# Patient Record
Sex: Male | Born: 1975 | Race: Black or African American | Hispanic: No | Marital: Married | Smoking: Current every day smoker
Health system: Southern US, Community
[De-identification: ages and names within clinical notes are randomized; demographics above are authoritative.]

## PROBLEM LIST (undated history)

## (undated) DIAGNOSIS — N529 Male erectile dysfunction, unspecified: Secondary | ICD-10-CM

## (undated) DIAGNOSIS — E119 Type 2 diabetes mellitus without complications: Secondary | ICD-10-CM

## (undated) DIAGNOSIS — E559 Vitamin D deficiency, unspecified: Secondary | ICD-10-CM

## (undated) DIAGNOSIS — E785 Hyperlipidemia, unspecified: Secondary | ICD-10-CM

## (undated) DIAGNOSIS — S060X9A Concussion with loss of consciousness of unspecified duration, initial encounter: Secondary | ICD-10-CM

## (undated) DIAGNOSIS — E291 Testicular hypofunction: Secondary | ICD-10-CM

## (undated) DIAGNOSIS — S060XAA Concussion with loss of consciousness status unknown, initial encounter: Secondary | ICD-10-CM

## (undated) DIAGNOSIS — K219 Gastro-esophageal reflux disease without esophagitis: Secondary | ICD-10-CM

## (undated) DIAGNOSIS — E11319 Type 2 diabetes mellitus with unspecified diabetic retinopathy without macular edema: Secondary | ICD-10-CM

## (undated) DIAGNOSIS — K0889 Other specified disorders of teeth and supporting structures: Secondary | ICD-10-CM

## (undated) DIAGNOSIS — I10 Essential (primary) hypertension: Secondary | ICD-10-CM

## (undated) HISTORY — DX: Vitamin D deficiency, unspecified: E55.9

## (undated) HISTORY — DX: Male erectile dysfunction, unspecified: N52.9

## (undated) HISTORY — DX: Hyperlipidemia, unspecified: E78.5

## (undated) HISTORY — DX: Type 2 diabetes mellitus with unspecified diabetic retinopathy without macular edema: E11.319

## (undated) HISTORY — DX: Type 2 diabetes mellitus without complications: E11.9

## (undated) HISTORY — DX: Essential (primary) hypertension: I10

## (undated) HISTORY — DX: Testicular hypofunction: E29.1

## (undated) HISTORY — DX: Concussion with loss of consciousness of unspecified duration, initial encounter: S06.0X9A

## (undated) HISTORY — DX: Concussion with loss of consciousness status unknown, initial encounter: S06.0XAA

---

## 1999-11-15 HISTORY — PX: VASECTOMY: SHX75

## 2005-07-13 ENCOUNTER — Emergency Department: Payer: Self-pay | Admitting: Emergency Medicine

## 2006-06-26 ENCOUNTER — Emergency Department: Payer: Self-pay | Admitting: Emergency Medicine

## 2006-11-14 HISTORY — PX: HERNIA REPAIR: SHX51

## 2006-11-15 ENCOUNTER — Emergency Department: Payer: Self-pay | Admitting: Emergency Medicine

## 2007-01-22 ENCOUNTER — Emergency Department: Payer: Self-pay | Admitting: Emergency Medicine

## 2007-09-03 ENCOUNTER — Emergency Department: Payer: Self-pay | Admitting: Emergency Medicine

## 2007-09-20 ENCOUNTER — Ambulatory Visit: Payer: Self-pay | Admitting: General Surgery

## 2007-09-25 ENCOUNTER — Ambulatory Visit: Payer: Self-pay | Admitting: General Surgery

## 2008-04-01 ENCOUNTER — Ambulatory Visit: Payer: Self-pay | Admitting: Gastroenterology

## 2010-06-05 ENCOUNTER — Emergency Department: Payer: Self-pay | Admitting: Emergency Medicine

## 2011-01-16 ENCOUNTER — Ambulatory Visit: Payer: Self-pay | Admitting: Internal Medicine

## 2013-11-21 LAB — BASIC METABOLIC PANEL
BUN: 12 mg/dL (ref 4–21)
CREATININE: 0.9 mg/dL (ref 0.6–1.3)
GLUCOSE: 286 mg/dL
POTASSIUM: 5 mmol/L (ref 3.4–5.3)
Sodium: 135 mmol/L — AB (ref 137–147)

## 2013-11-21 LAB — HEPATIC FUNCTION PANEL
ALK PHOS: 107 U/L (ref 25–125)
ALT: 17 U/L (ref 10–40)
AST: 12 U/L — AB (ref 14–40)
Bilirubin, Total: 0.4 mg/dL

## 2013-11-21 LAB — LIPID PANEL
Cholesterol: 171 mg/dL (ref 0–200)
HDL: 26 mg/dL — AB (ref 35–70)
LDL CALC: 90 mg/dL
Triglycerides: 275 mg/dL — AB (ref 40–160)

## 2014-02-24 LAB — HEMOGLOBIN A1C: Hgb A1c MFr Bld: 12.8 % — AB (ref 4.0–6.0)

## 2014-04-14 ENCOUNTER — Ambulatory Visit: Payer: Self-pay | Admitting: Physician Assistant

## 2014-04-18 ENCOUNTER — Ambulatory Visit: Payer: Self-pay | Admitting: Physician Assistant

## 2014-12-10 ENCOUNTER — Ambulatory Visit: Payer: Self-pay | Admitting: Family Medicine

## 2014-12-15 ENCOUNTER — Ambulatory Visit: Payer: Self-pay

## 2014-12-24 ENCOUNTER — Ambulatory Visit: Payer: Self-pay | Admitting: Family Medicine

## 2014-12-26 LAB — HEPATIC FUNCTION PANEL
ALK PHOS: 113 U/L (ref 25–125)
ALT: 20 U/L (ref 10–40)
AST: 8 U/L — AB (ref 14–40)
BILIRUBIN, TOTAL: 0.4 mg/dL

## 2014-12-26 LAB — LIPID PANEL
CHOLESTEROL: 170 mg/dL (ref 0–200)
HDL: 29 mg/dL — AB (ref 35–70)
LDL Cholesterol: 73 mg/dL
Triglycerides: 340 mg/dL — AB (ref 40–160)

## 2014-12-26 LAB — BASIC METABOLIC PANEL
BUN: 12 mg/dL (ref 4–21)
Creatinine: 0.9 mg/dL (ref 0.6–1.3)
Glucose: 421 mg/dL
POTASSIUM: 4.6 mmol/L (ref 3.4–5.3)
Sodium: 135 mmol/L — AB (ref 137–147)

## 2014-12-26 LAB — HEMOGLOBIN A1C: HEMOGLOBIN A1C: 14 % — AB (ref 4.0–6.0)

## 2014-12-31 ENCOUNTER — Encounter: Payer: Self-pay | Admitting: *Deleted

## 2015-01-28 ENCOUNTER — Ambulatory Visit: Payer: Self-pay | Admitting: Endocrinology

## 2015-01-28 ENCOUNTER — Telehealth: Payer: Self-pay

## 2015-01-28 NOTE — Telephone Encounter (Signed)
Please do not reschedule him. Instead, lets send a note to his PCP saying that he has no showed. Perhaps, when he is more ready to come here for his DM care, we could re-visit making an appt for him.  thanks

## 2015-01-28 NOTE — Telephone Encounter (Signed)
Called pt to inquire about no-show for his new patient apt.  No answer, lvmom.

## 2015-08-17 ENCOUNTER — Ambulatory Visit (INDEPENDENT_AMBULATORY_CARE_PROVIDER_SITE_OTHER): Payer: BLUE CROSS/BLUE SHIELD | Admitting: Family Medicine

## 2015-08-17 ENCOUNTER — Encounter: Payer: Self-pay | Admitting: Family Medicine

## 2015-08-17 ENCOUNTER — Telehealth: Payer: Self-pay

## 2015-08-17 VITALS — BP 157/107 | HR 99 | Temp 98.2°F | Ht 66.7 in | Wt 171.0 lb

## 2015-08-17 DIAGNOSIS — Z720 Tobacco use: Secondary | ICD-10-CM

## 2015-08-17 DIAGNOSIS — K921 Melena: Secondary | ICD-10-CM

## 2015-08-17 DIAGNOSIS — E1165 Type 2 diabetes mellitus with hyperglycemia: Secondary | ICD-10-CM

## 2015-08-17 DIAGNOSIS — I10 Essential (primary) hypertension: Secondary | ICD-10-CM | POA: Diagnosis not present

## 2015-08-17 DIAGNOSIS — G8929 Other chronic pain: Secondary | ICD-10-CM | POA: Diagnosis not present

## 2015-08-17 DIAGNOSIS — R198 Other specified symptoms and signs involving the digestive system and abdomen: Secondary | ICD-10-CM | POA: Diagnosis not present

## 2015-08-17 DIAGNOSIS — Z114 Encounter for screening for human immunodeficiency virus [HIV]: Secondary | ICD-10-CM | POA: Diagnosis not present

## 2015-08-17 DIAGNOSIS — Z794 Long term (current) use of insulin: Secondary | ICD-10-CM | POA: Diagnosis not present

## 2015-08-17 DIAGNOSIS — R634 Abnormal weight loss: Secondary | ICD-10-CM | POA: Diagnosis not present

## 2015-08-17 DIAGNOSIS — R6881 Early satiety: Secondary | ICD-10-CM | POA: Diagnosis not present

## 2015-08-17 DIAGNOSIS — R1013 Epigastric pain: Secondary | ICD-10-CM

## 2015-08-17 MED ORDER — OMEPRAZOLE 40 MG PO CPDR: DELAYED_RELEASE_CAPSULE | ORAL | Status: DC

## 2015-08-17 MED ORDER — SUCRALFATE 1 GM/10ML PO SUSP: 1.0000 g | Freq: Three times a day (TID) | ORAL | Status: DC

## 2015-08-17 NOTE — Telephone Encounter (Signed)
Pt's wife called stated pt's insurance will not cover Sucralfate. Pt's wants to know if something else can be sent to the pharmacy. Pharm is [facility] on [address]. Pt contacted insurance company they did not provide pt with a list of covered meds. Please call and advise pt of what to do next. Thanks.

## 2015-08-17 NOTE — Progress Notes (Signed)
BP 157/107 mmHg  Pulse 99  Temp(Src) 98.2 F (36.8 C)  Ht 5' 6.7" (1.694 m)  Wt 171 lb (77.565 kg)  BMI 27.03 kg/m2  SpO2 96%   Subjective:    Patient ID: Henry Fuller, male    DOB: June 25, 1976, 39 y.o.   MRN: [id number]  HPI: Henry Fuller is a 39 y.o. male  Chief Complaint  Patient presents with  . Abdominal Pain    nausea, some vomiting and uses the bathroom as soon as he eats. He states that he last weight without trying.   He thinks he has a stomach ulcer; it happens when he eats; not eating as much as he used to; gets full faster; stomach burns, like a burning sensation; burning now; feels bloated now; he got on the internet and he found that aspirin can eat the lining out of his stomach; he stopped the metformin to see if that helped; it kind of did help, but now noticing that something is going on; burns and aches Stool has been darker than usual; thought maybe he saw dark blood or note; he felt like vomiting, but no actual vomiting Appetite is not as good; does not eat much; he has lost some weight, about 10 pounds He does not take any NSAIDs any more; he used to take ibuprofen, taking them regularly at one point; maybe 6 weeks or so since ibuprofen; had one day for a headache, small 200 mg and took two of those (400 mg) just that one time No extra stress; he does smoke, never more than 1 ppd; maybe around 10-12 cigarettes a day Drinks very little alcohol, 3-4 Coronas once a month maybe; had some at the beach, 2-3 Coron[place] at the beach recently but he went to sleep, doesn't remember it tearing his stomach No diarrhea; more constipated than anything He quit taking his BP medicine  Relevant past medical, surgical, family and social history reviewed and updated as indicated.  Father had an ulcer Interim medical history since our last visit reviewed. Allergies and medications reviewed and updated.  Review of Systems  Per HPI unless specifically indicated  above     Objective:    BP 157/107 mmHg  Pulse 99  Temp(Src) 98.2 F (36.8 C)  Ht 5' 6.7" (1.694 m)  Wt 171 lb (77.565 kg)  BMI 27.03 kg/m2  SpO2 96%  Wt Readings from Last 3 Encounters:  08/17/15 171 lb (77.565 kg)    Physical Exam  Constitutional: He appears well-developed and well-nourished. No distress.  HENT:  Head: Normocephalic and atraumatic.  Eyes: EOM are normal. No scleral icterus.  Cardiovascular: Normal rate and regular rhythm.   Pulmonary/Chest: Effort normal and breath sounds normal.  Abdominal: He exhibits mass (fullness in the mid abdomen). He exhibits no distension, no abdominal bruit and no pulsatile midline mass. There is no hepatosplenomegaly. There is tenderness in the epigastric area and periumbilical area. There is no rigidity and no guarding.  Musculoskeletal: He exhibits no edema.  Neurological: He is alert.  Skin: Skin is warm and dry. No rash noted. No pallor.  Psychiatric: He has a normal mood and affect. His behavior is normal. Judgment and thought content normal.      Assessment & Plan:   Problem List Items Addressed This Visit      Cardiovascular and Mediastinum   Essential hypertension, benign    Uncontrolled today; start back on anti-hypertensives        Endocrine   Diabetes type  2, uncontrolled ([place])    Patient was referred to endocrinologist earlier this year        Other   Abdominal pain, chronic, epigastric - Primary    Discussed ddx with patient; gastritis, esophagitis, duodenitis, malignancy, less likely gastroparesis, pancreatitis; will get labs, abd/pelv CT, and refer to GI for consideration of EGD (and possible colonoscopy); reasons to see immediate medical attention reviewed; start PPI and carafate      Relevant Orders   CBC with Differential/Platelet (Completed)   Comprehensive metabolic panel (Completed)   Helicobacter Pylori Antibody, IGM (Completed)   H. pylori antibody, IgG (Completed)   H. pylori antibody, IgA  (Completed)   CT Abdomen Pelvis W Contrast   Ambulatory referral to Gastroenterology   Blood in the stool    Start PPI and carafate; check H pylori labs; avoid triggers which might exacerbate acid production; reasons to seek immediate medical care reviewed; abd/pelv CT scan and referral to GI entered      Relevant Orders   Helicobacter Pylori Antibody, IGM (Completed)   H. pylori antibody, IgG (Completed)   H. pylori antibody, IgA (Completed)   CT Abdomen Pelvis W Contrast   Ambulatory referral to Gastroenterology   Unintended weight loss    May be secondary to uncontrolled diabetes, decreased caloric intake due to abd pain, ulcer, but need to also rule-out malignancy; labs and CT scan ordered, and refer to GI      Relevant Orders   CBC with Differential/Platelet (Completed)   Comprehensive metabolic panel (Completed)   TSH (Completed)   CT Abdomen Pelvis W Contrast   Ambulatory referral to Gastroenterology   Generalized abdominal fullness    Labs ordered as well as abd/pelv CT      Relevant Orders   CT Abdomen Pelvis W Contrast   Ambulatory referral to Gastroenterology   Early satiety    abd pain, weight loss, ddx includes malignancy, ulcer, gastritis; he does have poorly controlled diabetes, so gastroparesis is in the differential as well; labs and CT and referral to GI      Relevant Orders   CT Abdomen Pelvis W Contrast   Ambulatory referral to Gastroenterology   Tobacco abuse    Encouraged patient to quit; explained smoking can increase risk of ulcers; also increases threat of GI malignancy       Other Visit Diagnoses    Encounter for screening for HIV        recommended one-time screening per [facility] and [facility]PSTF guidelines; patient agrees    Relevant Orders    HIV antibody (Completed)       Follow up plan: Return in about 1 week (around 08/24/2015) for follow-up.  Orders Placed This Encounter  Procedures  . CT Abdomen Pelvis W Contrast  . HIV antibody  . CBC  with Differential/Platelet  . Comprehensive metabolic panel  . Helicobacter Pylori Antibody, IGM  . H. pylori antibody, IgG  . H. pylori antibody, IgA  . TSH  . Ambulatory referral to Gastroenterology   Meds ordered this encounter  Medications  . omeprazole (PRILOSEC) 40 MG capsule    Sig: One by mouth daily    Dispense:  30 capsule    Refill:  1  . DISCONTD: sucralfate (CARAFATE) 1 GM/10ML suspension    Sig: Take 10 mLs (1 g total) by mouth 4 (four) times daily -  with meals and at bedtime. 60 minutes before meals and bedtime    Dispense:  420 mL    Refill:  0  (  sucralfate changed to pills)

## 2015-08-17 NOTE — Telephone Encounter (Signed)
Pt has been added to Dr. Delight Ovens schedule today @ 1:45pm for an acute visit. Thanks.

## 2015-08-17 NOTE — Patient Instructions (Addendum)
Do NOT take any more metformin for now until told to start back We'll get labs and a CT scan We'll refer you to the GI doctor If you get worse, then go to the ER Start the new medicines STOP smoking Go back on the blood pressure medicine    Gastroesophageal Reflux Disease, Adult Gastroesophageal reflux disease (GERD) happens when acid from your stomach goes into your food pipe (esophagus). The acid can cause a burning feeling in your chest. Over time, the acid can make small holes (ulcers) in your food pipe.  HOME CARE  Ask your doctor for advice about:  Losing weight.  Quitting smoking.  Alcohol use.  Avoid foods and drinks that make your problems worse. You may want to avoid:  Caffeine and alcohol.  Chocolate.  Mints.  Garlic and onions.  Spicy foods.  Citrus fruits, such as oranges, lemons, or limes.  Foods that contain tomato, such as sauce, chili, salsa, and pizza.  Fried and fatty foods.  Avoid lying down for 3 hours before you go to bed or before you take a nap.  Eat small meals often, instead of large meals.  Wear loose-fitting clothing. Do not wear anything tight around your waist.  Raise (elevate) the head of your bed 6 to 8 inches with wood blocks. Using extra pillows does not help.  Only take medicines as told by your doctor.  Do not take aspirin or ibuprofen. GET HELP RIGHT AWAY IF:   You have pain in your arms, neck, jaw, teeth, or back.  Your pain gets worse or changes.  You feel sick to your stomach (nauseous), throw up (vomit), or sweat (diaphoresis).  You feel short of breath, or you pass out (faint).  Your throw up is green, yellow, black, or looks like coffee grounds or blood.  Your poop (stool) is red, bloody, or black. MAKE SURE YOU:   Understand these instructions.  Will watch your condition.  Will get help right away if you are not doing well or get worse. Document Released: 04/18/2008 Document Revised: 01/23/2012 Document  Reviewed: 05/20/2011 [facility] Patient Information 2015 [place], [place]. This information is not intended to replace advice given to you by your health care provider. Make sure you discuss any questions you have with your health care provider. Gastrointestinal Bleeding Gastrointestinal (GI) bleeding means there is bleeding somewhere along the digestive tract, between the mouth and anus. CAUSES  There are many different problems that can cause GI bleeding. Possible causes include:  Esophagitis. This is inflammation, irritation, or swelling of the esophagus.  Hemorrhoids.These are veins that are full of blood (engorged) in the rectum. They cause pain, inflammation, and may bleed.  Anal fissures.These are areas of painful tearing which may bleed. They are often caused by passing hard stool.  Diverticulosis.These are pouches that form on the colon over time, with age, and may bleed significantly.  Diverticulitis.This is inflammation in areas with diverticulosis. It can cause pain, fever, and bloody stools, although bleeding is rare.  Polyps and cancer. Colon cancer often starts out as precancerous polyps.  Gastritis and ulcers.Bleeding from the upper gastrointestinal tract (near the stomach) may travel through the intestines and produce black, sometimes tarry, often bad smelling stools. In certain cases, if the bleeding is fast enough, the stools may not be black, but red. This condition may be life-threatening. SYMPTOMS   Vomiting bright red blood or material that looks like coffee grounds.  Bloody, black, or tarry stools. DIAGNOSIS  Your caregiver may diagnose  your condition by taking your history and performing a physical exam. More tests may be needed, including:  X-rays and other imaging tests.  Esophagogastroduodenoscopy (EGD). This test uses a flexible, lighted tube to look at your esophagus, stomach, and small intestine.  Colonoscopy. This test uses a flexible, lighted tube to  look at your colon. TREATMENT  Treatment depends on the cause of your bleeding.   For bleeding from the esophagus, stomach, small intestine, or colon, the caregiver doing your EGD or colonoscopy may be able to stop the bleeding as part of the procedure.  Inflammation or infection of the colon can be treated with medicines.  Many rectal problems can be treated with creams, suppositories, or warm baths.  Surgery is sometimes needed.  Blood transfusions are sometimes needed if you have lost a lot of blood. If bleeding is slow, you may be allowed to go home. If there is a lot of bleeding, you will need to stay in the hospital for observation. HOME CARE INSTRUCTIONS   Take any medicines exactly as prescribed.  Keep your stools soft by eating foods that are high in fiber. These foods include whole grains, legumes, fruits, and vegetables. Prunes (1 to 3 a day) work well for many people.  Drink enough fluids to keep your urine clear or pale yellow. SEEK IMMEDIATE MEDICAL CARE IF:   Your bleeding increases.  You feel lightheaded, weak, or you faint.  You have severe cramps in your back or abdomen.  You pass large blood clots in your stool.  Your problems are getting worse. MAKE SURE YOU:   Understand these instructions.  Will watch your condition.  Will get help right away if you are not doing well or get worse. Document Released: 10/28/2000 Document Revised: 10/17/2012 Document Reviewed: 10/10/2011 [facility] Patient Information 2015 [place], [place]. This information is not intended to replace advice given to you by your health care provider. Make sure you discuss any questions you have with your health care provider. Smoking Cessation, Tips for Success If you are ready to quit smoking, congratulations! You have chosen to help yourself be healthier. Cigarettes bring nicotine, tar, carbon monoxide, and other irritants into your body. Your lungs, heart, and blood vessels will be able to  work better without these poisons. There are many different ways to quit smoking. Nicotine gum, nicotine patches, a nicotine inhaler, or nicotine nasal spray can help with physical craving. Hypnosis, support groups, and medicines help break the habit of smoking. WHAT THINGS CAN I DO TO MAKE QUITTING EASIER?  Here are some tips to help you quit for good:  Pick a date when you will quit smoking completely. Tell all of your friends and family about your plan to quit on that date.  Do not try to slowly cut down on the number of cigarettes you are smoking. Pick a quit date and quit smoking completely starting on that day.  Throw away all cigarettes.   Clean and remove all ashtrays from your home, work, and car.  On a card, write down your reasons for quitting. Carry the card with you and read it when you get the urge to smoke.  Cleanse your body of nicotine. Drink enough water and fluids to keep your urine clear or pale yellow. Do this after quitting to flush the nicotine from your body.  Learn to predict your moods. Do not let a bad situation be your excuse to have a cigarette. Some situations in your life might tempt you into wanting  a cigarette.  Never have "just one" cigarette. It leads to wanting another and another. Remind yourself of your decision to quit.  Change habits associated with smoking. If you smoked while driving or when feeling stressed, try other activities to replace smoking. Stand up when drinking your coffee. Brush your teeth after eating. Sit in a different chair when you read the paper. Avoid alcohol while trying to quit, and try to drink fewer caffeinated beverages. Alcohol and caffeine may urge you to smoke.  Avoid foods and drinks that can trigger a desire to smoke, such as sugary or spicy foods and alcohol.  Ask people who smoke not to smoke around you.  Have something planned to do right after eating or having a cup of coffee. For example, plan to take a walk or  exercise.  Try a relaxation exercise to calm you down and decrease your stress. Remember, you may be tense and nervous for the first 2 weeks after you quit, but this will pass.  Find new activities to keep your hands busy. Play with a pen, coin, or rubber band. Doodle or draw things on paper.  Brush your teeth right after eating. This will help cut down on the craving for the taste of tobacco after meals. You can also try mouthwash.   Use oral substitutes in place of cigarettes. Try using lemon drops, carrots, cinnamon sticks, or chewing gum. Keep them handy so they are available when you have the urge to smoke.  When you have the urge to smoke, try deep breathing.  Designate your home as a nonsmoking area.  If you are a heavy smoker, ask your health care provider about a prescription for nicotine chewing gum. It can ease your withdrawal from nicotine.  Reward yourself. Set aside the cigarette money you save and buy yourself something nice.  Look for support from others. Join a support group or smoking cessation program. Ask someone at home or at work to help you with your plan to quit smoking.  Always ask yourself, "Do I need this cigarette or is this just a reflex?" Tell yourself, "Today, I choose not to smoke," or "I do not want to smoke." You are reminding yourself of your decision to quit.  Do not replace cigarette smoking with electronic cigarettes (commonly called e-cigarettes). The safety of e-cigarettes is unknown, and some may contain harmful chemicals.  If you relapse, do not give up! Plan ahead and think about what you will do the next time you get the urge to smoke. HOW WILL I FEEL WHEN I QUIT SMOKING? You may have symptoms of withdrawal because your body is used to nicotine (the addictive substance in cigarettes). You may crave cigarettes, be irritable, feel very hungry, cough often, get headaches, or have difficulty concentrating. The withdrawal symptoms are only temporary.  They are strongest when you first quit but will go away within 10-14 days. When withdrawal symptoms occur, stay in control. Think about your reasons for quitting. Remind yourself that these are signs that your body is healing and getting used to being without cigarettes. Remember that withdrawal symptoms are easier to treat than the major diseases that smoking can cause.  Even after the withdrawal is over, expect periodic urges to smoke. However, these cravings are generally short lived and will go away whether you smoke or not. Do not smoke! WHAT RESOURCES ARE AVAILABLE TO HELP ME QUIT SMOKING? Your health care provider can direct you to community resources or hospitals for support, which  may include:  Group support.  Education.  Hypnosis.  Therapy. Document Released: 07/29/2004 Document Revised: 03/17/2014 Document Reviewed: 04/18/2013 [facility] Patient Information 2015 [place], [place]. This information is not intended to replace advice given to you by your health care provider. Make sure you discuss any questions you have with your health care provider.

## 2015-08-18 ENCOUNTER — Telehealth: Payer: Self-pay | Admitting: Gastroenterology

## 2015-08-18 LAB — H. PYLORI ANTIBODY, IGG: H Pylori IgG: 3.8 U/mL — ABNORMAL HIGH (ref 0.0–0.8)

## 2015-08-18 LAB — CBC WITH DIFFERENTIAL/PLATELET
BASOS ABS: 0 10*3/uL (ref 0.0–0.2)
BASOS: 0 %
EOS (ABSOLUTE): 0.2 10*3/uL (ref 0.0–0.4)
Eos: 2 %
Hematocrit: 46.4 % (ref 37.5–51.0)
Hemoglobin: 15.7 g/dL (ref 12.6–17.7)
IMMATURE GRANS (ABS): 0 10*3/uL (ref 0.0–0.1)
Immature Granulocytes: 0 %
LYMPHS: 39 %
Lymphocytes Absolute: 3.5 10*3/uL — ABNORMAL HIGH (ref 0.7–3.1)
MCH: 29.3 pg (ref 26.6–33.0)
MCHC: 33.8 g/dL (ref 31.5–35.7)
MCV: 87 fL (ref 79–97)
MONOCYTES: 7 %
Monocytes Absolute: 0.6 10*3/uL (ref 0.1–0.9)
Neutrophils Absolute: 4.6 10*3/uL (ref 1.4–7.0)
Neutrophils: 52 %
PLATELETS: 332 10*3/uL (ref 150–379)
RBC: 5.35 x10E6/uL (ref 4.14–5.80)
RDW: 13.2 % (ref 12.3–15.4)
WBC: 9 10*3/uL (ref 3.4–10.8)

## 2015-08-18 LAB — COMPREHENSIVE METABOLIC PANEL
ALT: 14 IU/L (ref 0–44)
AST: 12 IU/L (ref 0–40)
Albumin/Globulin Ratio: 1.4 (ref 1.1–2.5)
Albumin: 4.1 g/dL (ref 3.5–5.5)
Alkaline Phosphatase: 128 IU/L — ABNORMAL HIGH (ref 39–117)
BUN / CREAT RATIO: 8 — AB (ref 9–20)
BUN: 9 mg/dL (ref 6–24)
Bilirubin Total: 0.8 mg/dL (ref 0.0–1.2)
CALCIUM: 9.5 mg/dL (ref 8.7–10.2)
CHLORIDE: 94 mmol/L — AB (ref 97–108)
CO2: 24 mmol/L (ref 18–29)
Creatinine, Ser: 1.06 mg/dL (ref 0.76–1.27)
GFR calc Af Amer: 101 mL/min/{1.73_m2} (ref >59)
GFR calc non Af Amer: 87 mL/min/{1.73_m2} (ref >59)
GLUCOSE: 379 mg/dL — AB (ref 65–99)
Globulin, Total: 3 g/dL (ref 1.5–4.5)
POTASSIUM: 4.4 mmol/L (ref 3.5–5.2)
SODIUM: 134 mmol/L (ref 134–144)
TOTAL PROTEIN: 7.1 g/dL (ref 6.0–8.5)

## 2015-08-18 LAB — HELICOBACTER PYLORI  ANTIBODY, IGM

## 2015-08-18 LAB — HIV ANTIBODY (ROUTINE TESTING W REFLEX): HIV SCREEN 4TH GENERATION: NONREACTIVE

## 2015-08-18 LAB — TSH: TSH: 0.632 u[IU]/mL (ref 0.450–4.500)

## 2015-08-18 MED ORDER — SUCRALFATE 1 G PO TABS: 1.0000 g | ORAL_TABLET | Freq: Three times a day (TID) | ORAL | Status: DC

## 2015-08-18 NOTE — Telephone Encounter (Signed)
Routing to provider  

## 2015-08-18 NOTE — Telephone Encounter (Signed)
Colonoscopy triage °

## 2015-08-18 NOTE — Telephone Encounter (Signed)
I sent Rx for pills instead of liquid

## 2015-08-19 LAB — H. PYLORI ANTIBODY, IGA: H. PYLORI, IGA ABS: 22.5 U — AB (ref 0.0–8.9)

## 2015-08-19 MED ORDER — AMOXICILL-CLARITHRO-LANSOPRAZ PO MISC: Freq: Two times a day (BID) | ORAL | Status: DC

## 2015-08-19 NOTE — Telephone Encounter (Signed)
RX did not go thru via [facility], so I called RX in to pharm.

## 2015-08-19 NOTE — Telephone Encounter (Signed)
Patient notified

## 2015-08-19 NOTE — Telephone Encounter (Signed)
Please let patient know that I am concerned he may indeed have the germ that causes infection; 2 of the 3 tests were positive, so I'd rather err on the side of treating that not treating; have him STOP his verapamil while on the antibiotic pack for his infection

## 2015-08-20 DIAGNOSIS — Z720 Tobacco use: Secondary | ICD-10-CM | POA: Insufficient documentation

## 2015-08-20 DIAGNOSIS — Z794 Long term (current) use of insulin: Secondary | ICD-10-CM | POA: Insufficient documentation

## 2015-08-20 DIAGNOSIS — R634 Abnormal weight loss: Secondary | ICD-10-CM | POA: Insufficient documentation

## 2015-08-20 DIAGNOSIS — R198 Other specified symptoms and signs involving the digestive system and abdomen: Secondary | ICD-10-CM | POA: Insufficient documentation

## 2015-08-20 DIAGNOSIS — I10 Essential (primary) hypertension: Secondary | ICD-10-CM | POA: Insufficient documentation

## 2015-08-20 DIAGNOSIS — R6881 Early satiety: Secondary | ICD-10-CM | POA: Insufficient documentation

## 2015-08-20 DIAGNOSIS — E1165 Type 2 diabetes mellitus with hyperglycemia: Secondary | ICD-10-CM | POA: Insufficient documentation

## 2015-08-20 NOTE — Assessment & Plan Note (Signed)
Uncontrolled today; start back on anti-hypertensives

## 2015-08-20 NOTE — Assessment & Plan Note (Signed)
May be secondary to uncontrolled diabetes, decreased caloric intake due to abd pain, ulcer, but need to also rule-out malignancy; labs and CT scan ordered, and refer to GI

## 2015-08-20 NOTE — Telephone Encounter (Signed)
Phoned patient for colon triage, no answer left message to contact office  °

## 2015-08-20 NOTE — Assessment & Plan Note (Signed)
Encouraged patient to quit; explained smoking can increase risk of ulcers; also increases threat of GI malignancy

## 2015-08-20 NOTE — Assessment & Plan Note (Signed)
abd pain, weight loss, ddx includes malignancy, ulcer, gastritis; he does have poorly controlled diabetes, so gastroparesis is in the differential as well; labs and CT and referral to GI

## 2015-08-20 NOTE — Assessment & Plan Note (Signed)
Labs ordered as well as abd/pelv CT

## 2015-08-20 NOTE — Assessment & Plan Note (Signed)
Discussed ddx with patient; gastritis, esophagitis, duodenitis, malignancy, less likely gastroparesis, pancreatitis; will get labs, abd/pelv CT, and refer to GI for consideration of EGD (and possible colonoscopy); reasons to see immediate medical attention reviewed; start PPI and carafate

## 2015-08-20 NOTE — Assessment & Plan Note (Signed)
Start PPI and carafate; check H pylori labs; avoid triggers which might exacerbate acid production; reasons to seek immediate medical care reviewed; abd/pelv CT scan and referral to GI entered

## 2015-08-20 NOTE — Assessment & Plan Note (Signed)
Patient was referred to endocrinologist earlier this year

## 2015-08-24 ENCOUNTER — Ambulatory Visit (INDEPENDENT_AMBULATORY_CARE_PROVIDER_SITE_OTHER): Payer: BLUE CROSS/BLUE SHIELD | Admitting: Family Medicine

## 2015-08-24 ENCOUNTER — Encounter: Payer: Self-pay | Admitting: Family Medicine

## 2015-08-24 VITALS — BP 145/95 | HR 91 | Temp 97.1°F | Wt 174.0 lb

## 2015-08-24 DIAGNOSIS — Z720 Tobacco use: Secondary | ICD-10-CM | POA: Diagnosis not present

## 2015-08-24 DIAGNOSIS — B9681 Helicobacter pylori [H. pylori] as the cause of diseases classified elsewhere: Secondary | ICD-10-CM | POA: Diagnosis not present

## 2015-08-24 DIAGNOSIS — I10 Essential (primary) hypertension: Secondary | ICD-10-CM | POA: Diagnosis not present

## 2015-08-24 DIAGNOSIS — Z794 Long term (current) use of insulin: Secondary | ICD-10-CM | POA: Diagnosis not present

## 2015-08-24 DIAGNOSIS — E1165 Type 2 diabetes mellitus with hyperglycemia: Secondary | ICD-10-CM

## 2015-08-24 DIAGNOSIS — K2970 Gastritis, unspecified, without bleeding: Principal | ICD-10-CM

## 2015-08-24 MED ORDER — BENAZEPRIL-HYDROCHLOROTHIAZIDE 20-12.5 MG PO TABS: 1.0000 | ORAL_TABLET | Freq: Every day | ORAL | Status: DC

## 2015-08-24 NOTE — Assessment & Plan Note (Signed)
Not controlled; stopping verapamil; will increase the ACE-I component of his blood pressure medicine to help protect his kidneys as well (with his diabetes); goal systolic less than 628 mmHg explained to patient; try to follow the DASH guidelines; return in one month for recheck and BMP at that time

## 2015-08-24 NOTE — Assessment & Plan Note (Signed)
Encouraged smoking cessation 

## 2015-08-24 NOTE — Patient Instructions (Addendum)
STOP the amoxicillin given by your dentist, since you are on antibiotics for the stomach infection Do call your dentist to let him know what antibiotics you are taking now and find out if he wants you to go back on the amoxicillin when you're done with these current antibiotics Avoid trigger foods like tomato sauce, fried foods, peppermint, chocolate, carbonated drinks, acidic foods and drinks Check sugars 3x a day STOP the benazepril/hctz 10/12.5 and start the new higher dose of benazepril/hctz 20/12.5; this should help protect your kidneys We'll have you see the endocrinologist about your diabetes Take your glucose diary (sugar log) to the diabetes doctor Please do call your eye doctor and see him yearly Have the CT scan done on Thursday   Helicobacter Pylori Antibodies Test WHY AM I HAVING THIS TEST? This is a blood test that looks for bacteria called Helicobacter pylori (H. pylori). H. pylori is a germ that can be found in the cells that line the stomach. Having high levels of H. pylori in your stomach puts you at risk for stomach ulcers and small bowel ulcers, long-term (chronic) inflammation of the lining of the stomach, or even ulcers that may occur in the canal that runs from the mouth to the stomach (esophagus). Presence of H. pylori can also increase your risk for stomach cancer if it is left untreated. Most people with H. pylori in their stomach bacteria have no symptoms. Your health care provider may ask you to have this test if you have symptoms of a stomach ulcer or small bowel ulcer, such as stomach pain before or after eating, heartburn, or nausea repeatedly after eating. WHAT KIND OF SAMPLE IS TAKEN? A blood sample is required for this test. It is usually collected by inserting a needle into a vein or sticking a finger with a small needle. HOW DO I PREPARE FOR THE TEST? There is no preparation required for this test. WHAT ARE THE REFERENCE RANGES? Reference ranges are considered  healthy ranges established after testing a large group of healthy people. Reference ranges may vary among different people, labs, and hospitals. It is your responsibility to obtain your test results. Ask the lab or department performing the test when and how you will get your results. Your test results will be reported as positive, negative, or equivocal. Equivocal means that your results are neither positive nor negative. References ranges for these results are as follows:  Less than or equal to 30 units/mL. This is negative.  Greater than or equal to 40 units/mL. This is positive.  30.01-39.99 units/mL. This is equivocal. WHAT DO THE RESULTS MEAN? Test results that are higher than normal may indicate numerous health conditions. These may include:  Short-term or long-term irritation of the stomach lining (gastritis).  Small bowel ulcer.  Stomach ulcer.  Stomach cancer. Talk with your health care provider to discuss your results, treatment options, and if necessary, the need for more tests. Talk with your health care provider if you have any questions about your results.   This information is not intended to replace advice given to you by your health care provider. Make sure you discuss any questions you have with your health care provider.   Document Released: 11/24/2004 Document Revised: 11/21/2014 Document Reviewed: 03/14/2014 Elsevier Interactive Patient Education 2016 [employer]. Smoking Cessation, Tips for Success If you are ready to quit smoking, congratulations! You have chosen to help yourself be healthier. Cigarettes bring nicotine, tar, carbon monoxide, and other irritants into your body. Your lungs,  heart, and blood vessels will be able to work better without these poisons. There are many different ways to quit smoking. Nicotine gum, nicotine patches, a nicotine inhaler, or nicotine nasal spray can help with physical craving. Hypnosis, support groups, and medicines help break  the habit of smoking. WHAT THINGS CAN I DO TO MAKE QUITTING EASIER?  Here are some tips to help you quit for good:  Pick a date when you will quit smoking completely. Tell all of your friends and family about your plan to quit on that date.  Do not try to slowly cut down on the number of cigarettes you are smoking. Pick a quit date and quit smoking completely starting on that day.  Throw away all cigarettes.   Clean and remove all ashtrays from your home, work, and car.  On a card, write down your reasons for quitting. Carry the card with you and read it when you get the urge to smoke.  Cleanse your body of nicotine. Drink enough water and fluids to keep your urine clear or pale yellow. Do this after quitting to flush the nicotine from your body.  Learn to predict your moods. Do not let a bad situation be your excuse to have a cigarette. Some situations in your life might tempt you into wanting a cigarette.  Never have "just one" cigarette. It leads to wanting another and another. Remind yourself of your decision to quit.  Change habits associated with smoking. If you smoked while driving or when feeling stressed, try other activities to replace smoking. Stand up when drinking your coffee. Brush your teeth after eating. Sit in a different chair when you read the paper. Avoid alcohol while trying to quit, and try to drink fewer caffeinated beverages. Alcohol and caffeine may urge you to smoke.  Avoid foods and drinks that can trigger a desire to smoke, such as sugary or spicy foods and alcohol.  Ask people who smoke not to smoke around you.  Have something planned to do right after eating or having a cup of coffee. For example, plan to take a walk or exercise.  Try a relaxation exercise to calm you down and decrease your stress. Remember, you may be tense and nervous for the first 2 weeks after you quit, but this will pass.  Find new activities to keep your hands busy. Play with a pen,  coin, or rubber band. Doodle or draw things on paper.  Brush your teeth right after eating. This will help cut down on the craving for the taste of tobacco after meals. You can also try mouthwash.   Use oral substitutes in place of cigarettes. Try using lemon drops, carrots, cinnamon sticks, or chewing gum. Keep them handy so they are available when you have the urge to smoke.  When you have the urge to smoke, try deep breathing.  Designate your home as a nonsmoking area.  If you are a heavy smoker, ask your health care provider about a prescription for nicotine chewing gum. It can ease your withdrawal from nicotine.  Reward yourself. Set aside the cigarette money you save and buy yourself something nice.  Look for support from others. Join a support group or smoking cessation program. Ask someone at home or at work to help you with your plan to quit smoking.  Always ask yourself, "Do I need this cigarette or is this just a reflex?" Tell yourself, "Today, I choose not to smoke," or "I do not want to smoke." You  are reminding yourself of your decision to quit.  Do not replace cigarette smoking with electronic cigarettes (commonly called e-cigarettes). The safety of e-cigarettes is unknown, and some may contain harmful chemicals.  If you relapse, do not give up! Plan ahead and think about what you will do the next time you get the urge to smoke. HOW WILL I FEEL WHEN I QUIT SMOKING? You may have symptoms of withdrawal because your body is used to nicotine (the addictive substance in cigarettes). You may crave cigarettes, be irritable, feel very hungry, cough often, get headaches, or have difficulty concentrating. The withdrawal symptoms are only temporary. They are strongest when you first quit but will go away within 10-14 days. When withdrawal symptoms occur, stay in control. Think about your reasons for quitting. Remind yourself that these are signs that your body is healing and getting used  to being without cigarettes. Remember that withdrawal symptoms are easier to treat than the major diseases that smoking can cause.  Even after the withdrawal is over, expect periodic urges to smoke. However, these cravings are generally short lived and will go away whether you smoke or not. Do not smoke! WHAT RESOURCES ARE AVAILABLE TO HELP ME QUIT SMOKING? Your health care provider can direct you to community resources or hospitals for support, which may include:  Group support.  Education.  Hypnosis.  Therapy.   This information is not intended to replace advice given to you by your health care provider. Make sure you discuss any questions you have with your health care provider.   Document Released: 07/29/2004 Document Revised: 11/21/2014 Document Reviewed: 04/18/2013 Elsevier Interactive Patient Education 2016 [place] DASH stands for "Dietary Approaches to Stop Hypertension." The DASH eating plan is a healthy eating plan that has been shown to reduce high blood pressure (hypertension). Additional health benefits may include reducing the risk of type 2 diabetes mellitus, heart disease, and stroke. The DASH eating plan may also help with weight loss. WHAT DO I NEED TO KNOW ABOUT THE DASH EATING PLAN? For the DASH eating plan, you will follow these general guidelines:  Choose foods with a percent daily value for sodium of less than 5% (as listed on the food label).  Use salt-free seasonings or herbs instead of table salt or sea salt.  Check with your health care provider or pharmacist before using salt substitutes.  Eat lower-sodium products, often labeled as "lower sodium" or "no salt added."  Eat fresh foods.  Eat more vegetables, fruits, and low-fat dairy products.  Choose whole grains. Look for the word "whole" as the first word in the ingredient list.  Choose fish and skinless chicken or [place] more often than red meat. Limit fish, poultry, and meat  to 6 oz (170 g) each day.  Limit sweets, desserts, sugars, and sugary drinks.  Choose heart-healthy fats.  Limit cheese to 1 oz (28 g) per day.  Eat more home-cooked food and less restaurant, buffet, and fast food.  Limit fried foods.  Cook foods using methods other than frying.  Limit canned vegetables. If you do use them, rinse them well to decrease the sodium.  When eating at a restaurant, ask that your food be prepared with less salt, or no salt if possible. WHAT FOODS CAN I EAT? Seek help from a dietitian for individual calorie needs. Grains Whole grain or whole wheat bread. Brown rice. Whole grain or whole wheat pasta. Quinoa, bulgur, and whole grain cereals. Low-sodium cereals. Corn or whole  wheat flour tortillas. Whole grain cornbread. Whole grain crackers. Low-sodium crackers. Vegetables Fresh or frozen vegetables (raw, steamed, roasted, or grilled). Low-sodium or reduced-sodium tomato and vegetable juices. Low-sodium or reduced-sodium tomato sauce and paste. Low-sodium or reduced-sodium canned vegetables.  Fruits All fresh, canned (in natural juice), or frozen fruits. Meat and Other Protein Products Ground beef (85% or leaner), grass-fed beef, or beef trimmed of fat. Skinless chicken or [place]. Ground chicken or [place]. Pork trimmed of fat. All fish and seafood. Eggs. Dried beans, peas, or lentils. Unsalted nuts and seeds. Unsalted canned beans. Dairy Low-fat dairy products, such as skim or 1% milk, 2% or reduced-fat cheeses, low-fat ricotta or cottage cheese, or plain low-fat yogurt. Low-sodium or reduced-sodium cheeses. Fats and Oils Tub margarines without trans fats. Light or reduced-fat mayonnaise and salad dressings (reduced sodium). Avocado. Safflower, olive, or canola oils. Natural peanut or almond butter. Other Unsalted popcorn and pretzels. The items listed above may not be a complete list of recommended foods or beverages. Contact your dietitian for more  options. WHAT FOODS ARE NOT RECOMMENDED? Grains White bread. White pasta. White rice. Refined cornbread. Bagels and croissants. Crackers that contain trans fat. Vegetables Creamed or fried vegetables. Vegetables in a cheese sauce. Regular canned vegetables. Regular canned tomato sauce and paste. Regular tomato and vegetable juices. Fruits Dried fruits. Canned fruit in light or heavy syrup. Fruit juice. Meat and Other Protein Products Fatty cuts of meat. Ribs, chicken wings, bacon, sausage, bologna, salami, chitterlings, fatback, hot dogs, bratwurst, and packaged luncheon meats. Salted nuts and seeds. Canned beans with salt. Dairy Whole or 2% milk, cream, half-and-half, and cream cheese. Whole-fat or sweetened yogurt. Full-fat cheeses or blue cheese. Nondairy creamers and whipped toppings. Processed cheese, cheese spreads, or cheese curds. Condiments Onion and garlic salt, seasoned salt, table salt, and sea salt. Canned and packaged gravies. Worcestershire sauce. Tartar sauce. Barbecue sauce. Teriyaki sauce. Soy sauce, including reduced sodium. Steak sauce. Fish sauce. Oyster sauce. Cocktail sauce. Horseradish. Ketchup and mustard. Meat flavorings and tenderizers. Bouillon cubes. Hot sauce. Tabasco sauce. Marinades. Taco seasonings. Relishes. Fats and Oils Butter, stick margarine, lard, shortening, ghee, and bacon fat. Coconut, palm kernel, or palm oils. Regular salad dressings. Other Pickles and olives. Salted popcorn and pretzels. The items listed above may not be a complete list of foods and beverages to avoid. Contact your dietitian for more information. WHERE CAN I FIND MORE INFORMATION? National Heart, Lung, and Blood Institute: travelstabloid.com   This information is not intended to replace advice given to you by your health care provider. Make sure you discuss any questions you have with your health care provider.   Document Released: 10/20/2011  Document Revised: 11/21/2014 Document Reviewed: 09/04/2013 Elsevier Interactive Patient Education [employer].

## 2015-08-24 NOTE — Assessment & Plan Note (Addendum)
Very noncompliant patient; I encouraged him to start back checking sugars 3xa day and we'll re-refer to endocrinologist; explained the risk of death from uncontrolled diabetes, damage to kidneys, ending up dialysis, etc if not taking care of himself

## 2015-08-25 NOTE — Telephone Encounter (Signed)
Patient needed office appt. 11/10

## 2015-08-25 NOTE — Progress Notes (Signed)
BP 145/95 mmHg  Pulse 91  Temp(Src) 97.1 F (36.2 C)  Wt 174 lb (78.926 kg)  SpO2 98%   Subjective:    Patient ID: Henry Fuller, male    DOB: January 29, 1976, 39 y.o.   MRN: [id number]  HPI: Henry Fuller is a 39 y.o. male  Chief Complaint  Patient presents with  . Hypertension    follow up, he restarted the Benazepril/HCT, but did not start back on the Verapamil, he thought you said to d/c it.  . Abdominal Pain    He is doing better since starting the PrevPac, he'd like more information on the H Pylori.    He is doing about 80% better; he started to feel better right away after the new medicine was started; did eat something that flared it up one day; thinks it was something fried He had a taste for some fish, but the grease got to him Feeling a little nauseated, but no vomiting No blood in the stool Goes to see the GI doctor on November 10th He has not had the CT scan yet He is taking the antibiotics, having weird taste in the mouth  Diabetes; not seeing endocrinologist; he checked his sugar last Saturday at his mother's house, 300 something after eating; still high he realizes; he started back on his insulin this week  High blood pressure; not taking his verapamil  He is taking amoxicillin for dental problem  He smokes, just at work or in the car on the way to work; does not smoke in the house; wife does not smoke; he did quit for 3 days cold [place] on his own; program through work to help him quit  Relevant past medical, surgical, family and social history reviewed and updated as indicated. Interim medical history since our last visit reviewed. Allergies and medications reviewed and updated.  Review of Systems Per HPI unless specifically indicated above     Objective:    BP 145/95 mmHg  Pulse 91  Temp(Src) 97.1 F (36.2 C)  Wt 174 lb (78.926 kg)  SpO2 98%  Wt Readings from Last 3 Encounters:  08/24/15 174 lb (78.926 kg)  08/17/15 171 lb (77.565  kg)    Physical Exam  Constitutional: He appears well-developed and well-nourished.  Eyes: EOM are normal. No scleral icterus.  Cardiovascular: Normal rate and regular rhythm.   Pulmonary/Chest: Effort normal and breath sounds normal.  Abdominal: Soft. Bowel sounds are normal. He exhibits no distension. There is tenderness (minimal epigastric tenderness). There is no guarding.  Musculoskeletal: He exhibits no edema.  Neurological: He is alert. He displays no tremor. Gait normal.  Skin: Skin is warm and dry. No pallor.  Psychiatric: He has a normal mood and affect. His behavior is normal. Judgment and thought content normal.   Results for orders placed or performed in visit on 08/17/15  HIV antibody  Result Value Ref Range   HIV Screen 4th Generation wRfx Non Reactive Non Reactive  CBC with Differential/Platelet  Result Value Ref Range   WBC 9.0 3.4 - 10.8 x10E3/uL   RBC 5.35 4.14 - 5.80 x10E6/uL   Hemoglobin 15.7 12.6 - 17.7 g/dL   Hematocrit 46.4 37.5 - 51.0 %   MCV 87 79 - 97 fL   MCH 29.3 26.6 - 33.0 pg   MCHC 33.8 31.5 - 35.7 g/dL   RDW 13.2 12.3 - 15.4 %   Platelets 332 150 - 379 x10E3/uL   Neutrophils 52 %   Lymphs 39 %  Monocytes 7 %   Eos 2 %   Basos 0 %   Neutrophils Absolute 4.6 1.4 - 7.0 x10E3/uL   Lymphocytes Absolute 3.5 (H) 0.7 - 3.1 x10E3/uL   Monocytes Absolute 0.6 0.1 - 0.9 x10E3/uL   EOS (ABSOLUTE) 0.2 0.0 - 0.4 x10E3/uL   Basophils Absolute 0.0 0.0 - 0.2 x10E3/uL   Immature Granulocytes 0 %   Immature Grans (Abs) 0.0 0.0 - 0.1 x10E3/uL  Comprehensive metabolic panel  Result Value Ref Range   Glucose 379 (H) 65 - 99 mg/dL   BUN 9 6 - 24 mg/dL   Creatinine, Ser 1.06 0.76 - 1.27 mg/dL   GFR calc non Af Amer 87 >59 mL/min/1.73   GFR calc Af Amer 101 >59 mL/min/1.73   BUN/Creatinine Ratio 8 (L) 9 - 20   Sodium 134 134 - 144 mmol/L   Potassium 4.4 3.5 - 5.2 mmol/L   Chloride 94 (L) 97 - 108 mmol/L   CO2 24 18 - 29 mmol/L   Calcium 9.5 8.7 - 10.2 mg/dL    Total Protein 7.1 6.0 - 8.5 g/dL   Albumin 4.1 3.5 - 5.5 g/dL   Globulin, Total 3.0 1.5 - 4.5 g/dL   Albumin/Globulin Ratio 1.4 1.1 - 2.5   Bilirubin Total 0.8 0.0 - 1.2 mg/dL   Alkaline Phosphatase 128 (H) 39 - 117 IU/L   AST 12 0 - 40 IU/L   ALT 14 0 - 44 IU/L  Helicobacter Pylori Antibody, IGM  Result Value Ref Range   H pylori, IgM Abs <9.0 0.0 - 8.9 units  H. pylori antibody, IgG  Result Value Ref Range   H Pylori IgG 3.8 (H) 0.0 - 0.8 U/mL  H. pylori antibody, IgA  Result Value Ref Range   H. pylori, IgA Abs 22.5 (H) 0.0 - 8.9 units  TSH  Result Value Ref Range   TSH 0.632 0.450 - 4.500 uIU/mL      Assessment & Plan:   Problem List Items Addressed This Visit      Cardiovascular and Mediastinum   Essential hypertension, benign    Not controlled; stopping verapamil; will increase the ACE-I component of his blood pressure medicine to help protect his kidneys as well (with his diabetes); goal systolic less than 086 mmHg explained to patient; try to follow the DASH guidelines; return in one month for recheck and BMP at that time      Relevant Medications   benazepril-hydrochlorthiazide (LOTENSIN HCT) 20-12.5 MG tablet     Digestive   Helicobacter pylori gastritis - Primary    Discussed diagnosis; will have him finish out medications; will want to get breath test after treatment; referred to GI and CT scan ordered because of the blood in stool and weight loss (red flags); he is improved; reasons to seek medical treatment reviewed; avoid triggers; quit smoking        Endocrine   Diabetes type 2, uncontrolled ([name])    Very noncompliant patient; I encouraged him to start back checking sugars 3xa day and we'll re-refer to endocrinologist; explained the risk of death from uncontrolled diabetes, damage to kidneys, ending up dialysis, etc if not taking care of himself      Relevant Medications   benazepril-hydrochlorthiazide (LOTENSIN HCT) 20-12.5 MG tablet     Other    Tobacco abuse    Encouraged smoking cessation          Follow up plan: Return in about 4 weeks (around 09/21/2015) for blood pressure and diabetes.  An after-visit summary was printed and given to the patient at [facility].  Please see the patient instructions which may contain other information and recommendations beyond what is mentioned above in the assessment and plan.  Meds ordered this encounter  Medications  . benazepril-hydrochlorthiazide (LOTENSIN HCT) 20-12.5 MG tablet    Sig: Take 1 tablet by mouth daily. New strength    Dispense:  30 tablet    Refill:  2    Cancel the old 10/12.5 and use this new dose instead   Medications Discontinued During This Encounter  Medication Reason  . verapamil (COVERA HS) 180 MG (CO) 24 hr tablet Discontinued by provider  . benazepril-hydrochlorthiazide (LOTENSIN HCT) 10-12.5 MG per tablet Dose change

## 2015-08-25 NOTE — Assessment & Plan Note (Signed)
Discussed diagnosis; will have him finish out medications; will want to get breath test after treatment; referred to GI and CT scan ordered because of the blood in stool and weight loss (red flags); he is improved; reasons to seek medical treatment reviewed; avoid triggers; quit smoking

## 2015-08-27 ENCOUNTER — Ambulatory Visit
Admission: RE | Admit: 2015-08-27 | Discharge: 2015-08-27 | Disposition: A | Discharge status: Home / Self Care | Payer: BLUE CROSS/BLUE SHIELD | Source: Ambulatory Visit | Attending: Family Medicine | Admitting: Family Medicine

## 2015-08-27 ENCOUNTER — Telehealth: Payer: Self-pay | Admitting: Family Medicine

## 2015-08-27 DIAGNOSIS — R198 Other specified symptoms and signs involving the digestive system and abdomen: Secondary | ICD-10-CM | POA: Diagnosis not present

## 2015-08-27 DIAGNOSIS — Z5181 Encounter for therapeutic drug level monitoring: Secondary | ICD-10-CM

## 2015-08-27 DIAGNOSIS — G8929 Other chronic pain: Secondary | ICD-10-CM

## 2015-08-27 DIAGNOSIS — R6881 Early satiety: Secondary | ICD-10-CM | POA: Diagnosis not present

## 2015-08-27 DIAGNOSIS — E1165 Type 2 diabetes mellitus with hyperglycemia: Secondary | ICD-10-CM

## 2015-08-27 DIAGNOSIS — Z794 Long term (current) use of insulin: Secondary | ICD-10-CM

## 2015-08-27 DIAGNOSIS — K921 Melena: Secondary | ICD-10-CM | POA: Diagnosis present

## 2015-08-27 DIAGNOSIS — R1013 Epigastric pain: Secondary | ICD-10-CM | POA: Insufficient documentation

## 2015-08-27 DIAGNOSIS — I7090 Unspecified atherosclerosis: Secondary | ICD-10-CM

## 2015-08-27 DIAGNOSIS — R634 Abnormal weight loss: Secondary | ICD-10-CM

## 2015-08-27 MED ORDER — IOHEXOL 300 MG/ML  SOLN
100.0000 mL | Freq: Once | INTRAMUSCULAR | Status: AC | PRN
  Administered 2015-08-27: 100 mL via INTRAVENOUS

## 2015-08-27 NOTE — Telephone Encounter (Signed)
I will call patient with CT scan results Likely celiac disease, see GI Premature atherosclerosis

## 2015-08-28 ENCOUNTER — Encounter: Payer: Self-pay | Admitting: *Deleted

## 2015-08-28 ENCOUNTER — Emergency Department
Admission: EM | Admit: 2015-08-28 | Discharge: 2015-08-29 | Disposition: A | Discharge status: Home / Self Care | Payer: BLUE CROSS/BLUE SHIELD | Attending: Emergency Medicine | Admitting: Emergency Medicine

## 2015-08-28 ENCOUNTER — Emergency Department: Payer: BLUE CROSS/BLUE SHIELD

## 2015-08-28 ENCOUNTER — Telehealth: Payer: Self-pay | Admitting: Family Medicine

## 2015-08-28 DIAGNOSIS — K561 Intussusception: Secondary | ICD-10-CM | POA: Insufficient documentation

## 2015-08-28 DIAGNOSIS — E1165 Type 2 diabetes mellitus with hyperglycemia: Secondary | ICD-10-CM | POA: Insufficient documentation

## 2015-08-28 DIAGNOSIS — Z720 Tobacco use: Secondary | ICD-10-CM | POA: Diagnosis not present

## 2015-08-28 DIAGNOSIS — R1084 Generalized abdominal pain: Secondary | ICD-10-CM

## 2015-08-28 DIAGNOSIS — R739 Hyperglycemia, unspecified: Secondary | ICD-10-CM

## 2015-08-28 DIAGNOSIS — R7989 Other specified abnormal findings of blood chemistry: Secondary | ICD-10-CM

## 2015-08-28 DIAGNOSIS — R740 Nonspecific elevation of levels of transaminase and lactic acid dehydrogenase [LDH]: Secondary | ICD-10-CM | POA: Diagnosis not present

## 2015-08-28 DIAGNOSIS — D72829 Elevated white blood cell count, unspecified: Secondary | ICD-10-CM | POA: Diagnosis not present

## 2015-08-28 DIAGNOSIS — Z7951 Long term (current) use of inhaled steroids: Secondary | ICD-10-CM | POA: Insufficient documentation

## 2015-08-28 DIAGNOSIS — Z794 Long term (current) use of insulin: Secondary | ICD-10-CM | POA: Diagnosis not present

## 2015-08-28 DIAGNOSIS — I7090 Unspecified atherosclerosis: Secondary | ICD-10-CM | POA: Insufficient documentation

## 2015-08-28 DIAGNOSIS — Z5181 Encounter for therapeutic drug level monitoring: Secondary | ICD-10-CM | POA: Insufficient documentation

## 2015-08-28 DIAGNOSIS — Z79899 Other long term (current) drug therapy: Secondary | ICD-10-CM | POA: Diagnosis not present

## 2015-08-28 DIAGNOSIS — I10 Essential (primary) hypertension: Secondary | ICD-10-CM | POA: Diagnosis not present

## 2015-08-28 DIAGNOSIS — R1010 Upper abdominal pain, unspecified: Secondary | ICD-10-CM | POA: Diagnosis present

## 2015-08-28 LAB — COMPREHENSIVE METABOLIC PANEL
ALT: 28 U/L (ref 17–63)
AST: 33 U/L (ref 15–41)
Albumin: 3.8 g/dL (ref 3.5–5.0)
Alkaline Phosphatase: 113 U/L (ref 38–126)
Anion gap: 9 (ref 5–15)
BILIRUBIN TOTAL: 0.7 mg/dL (ref 0.3–1.2)
BUN: 8 mg/dL (ref 6–20)
CO2: 25 mmol/L (ref 22–32)
CREATININE: 1.04 mg/dL (ref 0.61–1.24)
Calcium: 8.9 mg/dL (ref 8.9–10.3)
Chloride: 99 mmol/L — ABNORMAL LOW (ref 101–111)
Glucose, Bld: 410 mg/dL — ABNORMAL HIGH (ref 65–99)
POTASSIUM: 3.7 mmol/L (ref 3.5–5.1)
Sodium: 133 mmol/L — ABNORMAL LOW (ref 135–145)
TOTAL PROTEIN: 7.2 g/dL (ref 6.5–8.1)

## 2015-08-28 LAB — CBC
HCT: 46.5 % (ref 40.0–52.0)
Hemoglobin: 15.9 g/dL (ref 13.0–18.0)
MCH: 30.1 pg (ref 26.0–34.0)
MCHC: 34.2 g/dL (ref 32.0–36.0)
MCV: 88 fL (ref 80.0–100.0)
PLATELETS: 326 10*3/uL (ref 150–440)
RBC: 5.28 MIL/uL (ref 4.40–5.90)
RDW: 13.4 % (ref 11.5–14.5)
WBC: 15.1 10*3/uL — AB (ref 3.8–10.6)

## 2015-08-28 LAB — LIPASE, BLOOD: Lipase: 32 U/L (ref 22–51)

## 2015-08-28 LAB — LACTIC ACID, PLASMA: Lactic Acid, Venous: 2.3 mmol/L (ref 0.5–2.0)

## 2015-08-28 MED ORDER — SODIUM CHLORIDE 0.9 % IV BOLUS (SEPSIS)
1000.0000 mL | INTRAVENOUS | Status: AC
  Administered 2015-08-28: 1000 mL via INTRAVENOUS

## 2015-08-28 MED ORDER — ATORVASTATIN CALCIUM 10 MG PO TABS: 10.0000 mg | ORAL_TABLET | Freq: Every day | ORAL | Status: DC

## 2015-08-28 MED ORDER — SODIUM CHLORIDE 0.9 % IV BOLUS (SEPSIS)
1500.0000 mL | INTRAVENOUS | Status: AC
  Administered 2015-08-29: 1500 mL via INTRAVENOUS

## 2015-08-28 NOTE — Telephone Encounter (Signed)
Pts wife called and wanted to get results from ct scan and would like a call back

## 2015-08-28 NOTE — ED Provider Notes (Signed)
[facility] Emergency Department Provider Note  ____________________________________________  Time seen: Approximately 10:23 PM  I have reviewed the triage vital signs and the nursing notes.   HISTORY  Chief Complaint Abdominal Pain    HPI Henry Fuller is a 39 y.o. male with a history of insulin-dependent diabetes, hypertension, and tobacco abuse who presents at the advice of his primary care doctor for further evaluation of the enteroenteric intussusception diagnosed on outpatient CT scan yesterday.  In short, the patient has had intermittent abdominal/GI symptoms for several weeks to months and has been treated for H. pylori infection but continues to have intermittent upper abdominal pain.  He sometimes has diarrhea and then other times suffers from constipation.  Dr. Sanda Klein, his PCP, ordered an outpatient CT scan which was obtained yesterday and which was notable for 1-2 areas of enteroenteric intussusception.  The thought is that this may be a result of celiac disease, which would also explain his other GI symptoms.  The PCP informed the patient of these findings by phone, but then after thinking about it and worrying about the patient's report that his abdominal pain is constant today, she recommended that he go to the emergency department for further evaluation.  She also made a note and HCL that she was going to contact the on-call surgeon to discuss the case with him.  The patient reports that though his abdominal pain is typically intermittent, it has been constant and severe today, located primarily in his upper abdomen.  He endorses nausea but denies vomiting.  He has had no appetite and very little to eat today.  He also reports that he has not had a bowel movement and denies passing any gas today.  He describes the pain as sharp and a feeling of "fullness".  Nothing makes it better and eating makes it worse.  He denies fever/chills, chest pain,  shortness of breath.    Past Medical History  Diagnosis Date  . Diabetes mellitus without complication ([place])   . Hyperlipidemia   . Hypertension   . Hypogonadism in male   . Vitamin D deficiency   . Erectile dysfunction     Patient Active Problem List   Diagnosis Date Noted  . Atherosclerosis 08/28/2015  . Medication monitoring encounter 08/28/2015  . Generalized abdominal pain   . Intussusception of small bowel ([place])   . Helicobacter pylori gastritis 08/24/2015  . Unintended weight loss 08/20/2015  . Generalized abdominal fullness 08/20/2015  . Early satiety 08/20/2015  . Diabetes type 2, uncontrolled ([place]) 08/20/2015  . Tobacco abuse 08/20/2015  . Essential hypertension, benign 08/20/2015  . Abdominal pain, chronic, epigastric 08/17/2015  . Blood in the stool 08/17/2015    Past Surgical History  Procedure Laterality Date  . Vasectomy  2001  . Hernia repair  8309    umbilical    Current Outpatient Rx  Name  Route  Sig  Dispense  Refill  . amoxicillin-clarithromycin-lansoprazole [facility]) combo pack   Oral   Take by mouth 2 (two) times daily. Follow package directions, 10 day course; do not take prilosec while on this   1 kit   0   . benazepril-hydrochlorthiazide (LOTENSIN HCT) 20-12.5 MG tablet   Oral   Take 1 tablet by mouth daily. New strength   30 tablet   2     Cancel the old 10/12.5 and use this new dose inste ...   . insulin NPH-regular Human (NOVOLIN 70/30) (70-30) 100 UNIT/ML injection   Subcutaneous  Inject 12 Units into the skin 2 (two) times daily with a meal.         . omeprazole (PRILOSEC) 40 MG capsule      One by mouth daily   30 capsule   1   . sucralfate (CARAFATE) 1 G tablet   Oral   Take 1 tablet (1 g total) by mouth 4 (four) times daily -  with meals and at bedtime.   60 tablet   0     Use this INSTEAD of liquid   . atorvastatin (LIPITOR) 10 MG tablet   Oral   Take 1 tablet (10 mg total) by mouth at bedtime.   30  tablet   1     START MONDAY   . fluticasone (FLONASE) 50 MCG/ACT nasal spray   Each Nare   Place 2 sprays into both nostrils daily.           Allergies Atenolol  Family History  Problem Relation Age of Onset  . Diabetes Mother   . Hypertension Mother   . Cancer Father     colon cancer  . Diabetes Maternal Grandmother   . Hypertension Maternal Grandmother   . Liver disease Maternal Grandfather   . Cancer Paternal Grandmother   . Stroke Paternal Grandfather     Social History Social History  Substance Use Topics  . Smoking status: Current Every Day Smoker -- 0.50 packs/day    Types: Cigarettes  . Smokeless tobacco: Never Used  . Alcohol Use: No    Review of Systems Constitutional: No fever/chills Eyes: No visual changes. ENT: No sore throat. Cardiovascular: Denies chest pain. Respiratory: Denies shortness of breath. Gastrointestinal: Constant severe upper abdominal pain today with nausea but no vomiting.  He also denies having a bowel movement or passing flatus today. Genitourinary: Negative for dysuria. Musculoskeletal: Negative for back pain. Skin: Negative for rash. Neurological: Negative for headaches, focal weakness or numbness.  10-point ROS otherwise negative.  ____________________________________________   PHYSICAL EXAM:  VITAL SIGNS: ED Triage Vitals  Enc Vitals Group     BP 08/28/15 2141 177/114 mmHg     Pulse Rate 08/28/15 2141 96     Resp 08/28/15 2141 16     Temp 08/28/15 2141 98.6 F (37 C)     Temp Source 08/28/15 2141 Oral     SpO2 08/28/15 2141 96 %     Weight 08/28/15 2141 170 lb (77.111 kg)     Height 08/28/15 2141 '5\' 9"'  (1.753 m)     Head Cir --      Peak Flow --      Pain Score 08/28/15 2138 4     Pain Loc --      Pain Edu? --      Excl. in [place]? --     Constitutional: Alert and oriented. Well appearing and in no acute distress. Eyes: Conjunctivae are normal. PERRL. EOMI. Head: Atraumatic. Nose: No  congestion/rhinnorhea. Mouth/Throat: Mucous membranes are moist.  Oropharynx non-erythematous. Neck: No stridor.   Cardiovascular: Normal rate, regular rhythm. Grossly normal heart sounds.  Good peripheral circulation. Respiratory: Normal respiratory effort.  No retractions. Lungs CTAB. Gastrointestinal: Soft, mild distention, tender to palpation of the upper abdomen but with no rebound or guarding.  No evidence of peritonitis.  Musculoskeletal: No lower extremity tenderness nor edema.  No joint effusions. Neurologic:  Normal speech and language. No gross focal neurologic deficits are appreciated.  Skin:  Skin is warm, dry and intact. No rash noted. Psychiatric:  Mood and affect are normal. Speech and behavior are normal.  ____________________________________________   LABS (all labs ordered are listed, but only abnormal results are displayed)  Labs Reviewed  COMPREHENSIVE METABOLIC PANEL - Abnormal; Notable for the following:    Sodium 133 (*)    Chloride 99 (*)    Glucose, Bld 410 (*)    All other components within normal limits  CBC - Abnormal; Notable for the following:    WBC 15.1 (*)    All other components within normal limits  LACTIC ACID, PLASMA - Abnormal; Notable for the following:    Lactic Acid, Venous 2.3 (*)    All other components within normal limits  LIPASE, BLOOD  LACTIC ACID, PLASMA  URINALYSIS COMPLETEWITH MICROSCOPIC ([facility] ONLY)  CBC   ____________________________________________  EKG  Not indicated ____________________________________________  RADIOLOGY  (Outpatient CT obtained yesterday) Ct Abdomen Pelvis W Contrast  08/27/2015  CLINICAL DATA:  Abdominal pain, chronic and epigastric. Weight loss. Blood in stool. EXAM: CT ABDOMEN AND PELVIS WITH CONTRAST TECHNIQUE: Multidetector CT imaging of the abdomen and pelvis was performed using the standard protocol following bolus administration of intravenous contrast. CONTRAST:  172m OMNIPAQUE IOHEXOL 300  MG/ML  SOLN COMPARISON:  None. FINDINGS: Lower chest and abdominal wall:  No contributory findings. Hepatobiliary: No focal liver abnormality.No evidence of biliary obstruction or stone. Pancreas: Unremarkable. Spleen: Unremarkable. Adrenals/Urinary Tract: Negative adrenals. No hydronephrosis or stone. Unremarkable bladder. Reproductive:Early vas deferens calcification correlating with diabetes history. Stomach/Bowel: There is a short segment (roughly 3 cm long) enteroenteric intussusception in the left upper quadrant. On coronal reformats possible additional even shorter segment intussusception on series 5, image 50. No lead point adenopathy or bowel wall mass is identified. No associated obstruction. No indication of colitis. No appendicitis. Vascular/Lymphatic: Premature atherosclerosis and medium vessel calcification. No acute arterial finding or visible flow limiting stenosis. No mass or adenopathy. Peritoneal: No ascites or pneumoperitoneum. Musculoskeletal: No acute finding.  No sacroiliitis. IMPRESSION: 1. One or 2 short-segment and nonobstructing enteroenteric intussusceptions. These are often transient and incidental but given the history note that there is association with celiac disease. 2. Premature atherosclerosis. Electronically Signed   By: JMonte FantasiaM.D.   On: 08/27/2015 08:57    ____________________________________________   PROCEDURES  Procedure(s) performed: None  Critical Care performed: No ____________________________________________   INITIAL IMPRESSION / ASSESSMENT AND PLAN / ED COURSE  Pertinent labs & imaging results that were available during my care of the patient were reviewed by me and considered in my medical decision making (see chart for details).  There is no evidence of acute abdomen at this time.  The patient does have a moderate leukocytosis.  I will obtain a lactic acid.  The chemistry is unremarkable.  I discussed the patient's history, CT scan results,  and an explanation of intussusception with him and his wife.  I have talked by phone with Dr. WAdonis Huguenin the on-call surgeon, who will evaluate the patient in the emergency department and offer recommendations.  I do not believe he has an obstruction though this is possible given the known intussusception and the fact that he has increased pain with no passage of flatus today per his report.  ----------------------------------------- 11:51 PM on 08/28/2015 -----------------------------------------  Dr. WAdonis Hugueninevaluated the patient in person in the emergency department and feels that the patient has a benign abdominal exam, and I am in agreement.  However, it is concerning that the patient has both a leukocytosis and elevated lactate.  I will provide  30 mL/kg (2.5 L) of normal saline IV fluids and we will recheck a lactate at the 3 hour mark.  We will also recheck a CBC and a chemistry at that time (to check for dilution of the white blood cell count as well as to check his chemistry and blood sugar).  If he remains generally comfortable/asymptomatic and his numbers improve, I believe he can go home.  However, if he develops worsening abdominal pain, fever, or if his lab values worsen in spite of fluid resuscitation, he will need to stay in the hospital.  Given that he does not look toxic/infectious, I will hold off on empiric antibiotics at this time.  I discussed this plan with the patient, his wife, the nursing staff, and my relief doctor, Dr. Dahlia Client, and everyone is in agreement.  I am also ordering an acute abdomen series with chest x-ray which Dr. Dahlia Client will follow up on.  ____________________________________________  FINAL CLINICAL IMPRESSION(S) / ED DIAGNOSES  Final diagnoses:  Generalized abdominal pain  Intussusception of small bowel ([place])  Leukocytosis  Elevated lactic acid level  Hyperglycemia      NEW MEDICATIONS STARTED DURING THIS VISIT:  New Prescriptions   No medications on  file     Hinda Kehr, MD 08/28/15 2354

## 2015-08-28 NOTE — ED Notes (Signed)
Pt reports abdominal pain, had CT Scan yesterday, diagnosed today with intussusception. Nausea.

## 2015-08-28 NOTE — Assessment & Plan Note (Signed)
Encouraged compliance; refer to endo

## 2015-08-28 NOTE — Consult Note (Signed)
Patient ID: Henry Fuller, male   DOB: 1976-05-30, 39 y.o.   MRN: [id number]  CC: Epigastric Abdominal Pain HPI Henry Fuller is a 39 y.o. male presents to emergency department for evaluation under direct condition from his primary care manager. Patient reports an approximate 2 month history of vague midepigastric abdominal pain. He is thought all along this could be related to an ulcer. He states that during this time. His also had multiple bouts of constipation, going numerous days between bowel movements. He states that he's had some weight loss over this time period is well, with what he reports as early satiety. The pain is always in the midepigastric region. He denies any emesis but with some intermittent subjective nausea. He's been able to eat at least a portion of every meal every day. He denies any fevers, chills, shortness breath, chest pain. He does report some intermittent bloating sensations. The pain isn't having for the last 2 months as a same pain is having today, although he reports that the pain today has lasted longer than it had previously. States his last bowel movement was within the last 2 days. He underwent an outpatient CT scan ordered by his primary care manager for evaluation of this chronic intermittent pain and was found to have nonobstructing small bowel intussusception.  HPI  Past Medical History  Diagnosis Date  . Diabetes mellitus without complication ([place])   . Hyperlipidemia   . Hypertension   . Hypogonadism in male   . Vitamin D deficiency   . Erectile dysfunction     Past Surgical History  Procedure Laterality Date  . Vasectomy  2001  . Hernia repair  4562    umbilical    Family History  Problem Relation Age of Onset  . Diabetes Mother   . Hypertension Mother   . Cancer Father     colon cancer  . Diabetes Maternal Grandmother   . Hypertension Maternal Grandmother   . Liver disease Maternal Grandfather   . Cancer Paternal Grandmother    . Stroke Paternal Grandfather     Social History Social History  Substance Use Topics  . Smoking status: Current Every Day Smoker -- 0.50 packs/day    Types: Cigarettes  . Smokeless tobacco: Never Used  . Alcohol Use: No    Allergies  Allergen Reactions  . Atenolol Other (See Comments)    Chest pressure    No current facility-administered medications for this encounter.   Current Outpatient Prescriptions  Medication Sig Dispense Refill  . amoxicillin-clarithromycin-lansoprazole (PREVPAC) combo pack Take by mouth 2 (two) times daily. Follow package directions, 10 day course; do not take prilosec while on this 1 kit 0  . benazepril-hydrochlorthiazide (LOTENSIN HCT) 20-12.5 MG tablet Take 1 tablet by mouth daily. New strength 30 tablet 2  . insulin NPH-regular Human (NOVOLIN 70/30) (70-30) 100 UNIT/ML injection Inject 12 Units into the skin 2 (two) times daily with a meal.    . omeprazole (PRILOSEC) 40 MG capsule One by mouth daily 30 capsule 1  . sucralfate (CARAFATE) 1 G tablet Take 1 tablet (1 g total) by mouth 4 (four) times daily -  with meals and at bedtime. 60 tablet 0  . atorvastatin (LIPITOR) 10 MG tablet Take 1 tablet (10 mg total) by mouth at bedtime. 30 tablet 1  . fluticasone (FLONASE) 50 MCG/ACT nasal spray Place 2 sprays into both nostrils daily.       Review of Systems A multipoint review of systems was completed. All  pertinent positives and negatives within the history of present illness.  Physical Exam Blood pressure 156/107, pulse 93, temperature 98.6 F (37 C), temperature source Oral, resp. rate 18, height _0  (1.753 m), weight 77.111 kg (170 lb), SpO2 96 %. CONSTITUTIONAL: Resting in bed in no acute distress. EYES: Pupils are equal, round, and reactive to light, Sclera are non-icteric. EARS, NOSE, MOUTH AND THROAT: The oropharynx is clear. The oral mucosa is pink and moist. Hearing is intact to voice. LYMPH NODES:  Lymph nodes in the neck are  normal. RESPIRATORY:  Lungs are clear. There is normal respiratory effort, with equal breath sounds bilaterally, and without pathologic use of accessory muscles. CARDIOVASCULAR: Heart is regular without murmurs, gallops, or rubs. GI: The abdomen is soft, minimally tender in the midepigastric region, and nondistended. There are no palpable masses. There is no hepatosplenomegaly. There are normal bowel sounds in all quadrants. GU: Rectal deferred.   MUSCULOSKELETAL: Normal muscle strength and tone. No cyanosis or edema.   SKIN: Turgor is good and there are no pathologic skin lesions or ulcers. NEUROLOGIC: Motor and sensation is grossly normal. Cranial nerves are grossly intact. PSYCH:  Oriented to person, place and time. Affect is normal.  Data Reviewed CT scan reviewed personally which shows mid jejunal entero-enteral intussusception. No evidence of obstruction as the small bowel is of normal caliber throughout and contrast clearly makes past the area and into the colon. Labs today without rows leukocytosis of 15.1, hyperglycemia with a sugar of 410, and a mild lactic acidosis with a lactate of 2.3. I have personally reviewed the patient's imaging, laboratory findings and medical records.    Assessment    Chronic abdominal pain with uncontrolled diabetes    Plan    Had a long conversation with the patient about his presentation and imaging findings. Discussed that although it does show an intussusception there is no evidence of lead point, mass, obstruction. In the absence of any of these there is no surgical intervention warranted. Discussed that the common causes of nonobstructing small bowel intussusception are multiple and include celiac disease, Crohn's disease, other inflammatory bowel diseases. None of these require urgent surgical intervention in the absence of obstruction or perforation  Patient's lactic acidosis is likely related to his hyperglycemia. I have no current explanation  for his leukocytosis. There are no physical exam or current radiological findings that would explain a surgical cause for his leukocytosis.  There is no current indication for any surgical intervention for this patient. He needs GI follow-up to better workup his chronic abdominal pain. This may represent early onset Crohn's disease or just a secondary sequelae of prolonged uncontrolled diabetes with gastroparesis.     Time spent with the patient was 45 minutes, with more than 50% of the time spent in face-to-face education, counseling and care coordination.     Clayburn Pert 08/28/2015, 11:34 PM

## 2015-08-28 NOTE — ED Notes (Signed)
CRITICAL LACTIC 2.3, Dr Karma Greaser notifed

## 2015-08-28 NOTE — Telephone Encounter (Signed)
I spoke with patient about the CT scan results Discussed intussusception; so important to see GI Avoid gluten, may have celiac disease Start back on metformin on Monday So important to get diabetes under control because of premature atherosclerosis Start atorvastatin on Monday; recheck labs in 2 months He does not have an appt for endocrinologist Encouraged him to take his insulin, check his sugars; I'll put in referral since Dr. Howell Rucks is gone

## 2015-08-28 NOTE — Telephone Encounter (Signed)
I called back to speak with patient after thinking more about his case; I talked with patient and his wife I got more history from Henry Fuller; he had been feeling better after his initial round of antibiotics, and when I saw him in the office this week, he had improved; however, he has started to feel worse today, ate dinner this evening but didn't finish it all; no fevers; pain about a 5 out of 10 Discussed his symptoms and findings on the CT further; radiologist's report mentions transient condition, and this may have been sliding in and out for the last few weeks, but this is not going away apparently based on his clinical symptoms, and I think he's going to likely need surgery; however, I am not a surgeon I explained; just thinking about his symptoms, I want him to go now to the ER; he and his wife agree and they will go now to the ER I talked with [place] (name?), charge nurse in the ER at [place] paged surgeon on-call for unassigned at [facility] to discuss

## 2015-08-29 LAB — URINALYSIS COMPLETE WITH MICROSCOPIC (ARMC ONLY)
Bacteria, UA: NONE SEEN
Bilirubin Urine: NEGATIVE
Glucose, UA: >500 mg/dL — AB
HGB URINE DIPSTICK: NEGATIVE
KETONES UR: NEGATIVE mg/dL
LEUKOCYTES UA: NEGATIVE
NITRITE: NEGATIVE
PH: 7 (ref 5.0–8.0)
PROTEIN: NEGATIVE mg/dL
SQUAMOUS EPITHELIAL / LPF: NONE SEEN
Specific Gravity, Urine: 1.025 (ref 1.005–1.030)

## 2015-08-29 LAB — CBC
HCT: 41.5 % (ref 40.0–52.0)
HEMOGLOBIN: 14 g/dL (ref 13.0–18.0)
MCH: 29.6 pg (ref 26.0–34.0)
MCHC: 33.7 g/dL (ref 32.0–36.0)
MCV: 87.8 fL (ref 80.0–100.0)
PLATELETS: 288 10*3/uL (ref 150–440)
RBC: 4.73 MIL/uL (ref 4.40–5.90)
RDW: 13.2 % (ref 11.5–14.5)
WBC: 16.4 10*3/uL — ABNORMAL HIGH (ref 3.8–10.6)

## 2015-08-29 LAB — BASIC METABOLIC PANEL
Anion gap: 4 — ABNORMAL LOW (ref 5–15)
BUN: 11 mg/dL (ref 6–20)
CALCIUM: 7.7 mg/dL — AB (ref 8.9–10.3)
CHLORIDE: 107 mmol/L (ref 101–111)
CO2: 25 mmol/L (ref 22–32)
CREATININE: 0.85 mg/dL (ref 0.61–1.24)
GFR calc non Af Amer: >60 mL/min (ref >60)
Glucose, Bld: 294 mg/dL — ABNORMAL HIGH (ref 65–99)
Potassium: 3.6 mmol/L (ref 3.5–5.1)
SODIUM: 136 mmol/L (ref 135–145)

## 2015-08-29 LAB — LACTIC ACID, PLASMA: LACTIC ACID, VENOUS: 1.2 mmol/L (ref 0.5–2.0)

## 2015-08-29 MED ORDER — ONDANSETRON HCL 4 MG/2ML IJ SOLN
4.0000 mg | Freq: Once | INTRAMUSCULAR | Status: AC
  Administered 2015-08-29: 4 mg via INTRAVENOUS
  Filled 2015-08-29: qty 2

## 2015-08-29 MED ORDER — ONDANSETRON 4 MG PO TBDP: 4.0000 mg | ORAL_TABLET | Freq: Three times a day (TID) | ORAL | Status: DC | PRN

## 2015-08-29 NOTE — Discharge Instructions (Signed)
Abdominal Pain, Adult °Many things can cause abdominal pain. Usually, abdominal pain is not caused by a disease and will improve without treatment. It can often be observed and treated at home. Your health care provider will do a physical exam and possibly order blood tests and X-rays to help determine the seriousness of your pain. However, in many cases, more time must pass before a clear cause of the pain can be found. Before that point, your health care provider may not know if you need more testing or further treatment. °HOME CARE INSTRUCTIONS °Monitor your abdominal pain for any changes. The following actions may help to alleviate any discomfort you are experiencing: °· Only take over-the-counter or prescription medicines as directed by your health care provider. °· Do not take laxatives unless directed to do so by your health care provider. °· Try a clear liquid diet (broth, tea, or water) as directed by your health care provider. Slowly move to a bland diet as tolerated. °SEEK MEDICAL CARE IF: °· You have unexplained abdominal pain. °· You have abdominal pain associated with nausea or diarrhea. °· You have pain when you urinate or have a bowel movement. °· You experience abdominal pain that wakes you in the night. °· You have abdominal pain that is worsened or improved by eating food. °· You have abdominal pain that is worsened with eating fatty foods. °· You have a fever. °SEEK IMMEDIATE MEDICAL CARE IF: °· Your pain does not go away within 2 hours. °· You keep throwing up (vomiting). °· Your pain is felt only in portions of the abdomen, such as the right side or the left lower portion of the abdomen. °· You pass bloody or black tarry stools. °MAKE SURE YOU: °· Understand these instructions. °· Will watch your condition. °· Will get help right away if you are not doing well or get worse. °  °This information is not intended to replace advice given to you by your health care provider. Make sure you discuss  any questions you have with your health care provider. °  °Document Released: 08/10/2005 Document Revised: 07/22/2015 Document Reviewed: 07/10/2013 °Elsevier Interactive Patient Education ©2016 Elsevier Inc[employer] ° °Hyperglycemia °Hyperglycemia occurs when the glucose (sugar) in your blood is too high. Hyperglycemia can happen for many reasons, but it most often happens to people who do not know they have diabetes or are not managing their diabetes properly.  °CAUSES  °Whether you have diabetes or not, there are other causes of hyperglycemia. Hyperglycemia can occur when you have diabetes, but it can also occur in other situations that you might not be as aware of, such as: °Diabetes °· If you have diabetes and are having problems controlling your blood glucose, hyperglycemia could occur because of some of the following reasons: °¨ Not following your meal plan. °¨ Not taking your diabetes medications or not taking it properly. °¨ Exercising less or doing less activity than you normally do. °¨ Being sick. °Pre-diabetes °· This cannot be ignored. Before people develop Type 2 diabetes, they almost always have "pre-diabetes." This is when your blood glucose levels are higher than normal, but not yet high enough to be diagnosed as diabetes. Research has shown that some long-term damage to the body, especially the heart and circulatory system, may already be occurring during pre-diabetes. If you take action to manage your blood glucose when you have pre-diabetes, you may delay or prevent Type 2 diabetes from developing. °Stress °· If you have diabetes, you may be "diet"   controlled or on oral medications or insulin to control your diabetes. However, you may find that your blood glucose is higher than usual in the hospital whether you have diabetes or not. This is often referred to as "stress hyperglycemia." Stress can elevate your blood glucose. This happens because of hormones put out by the body during times of stress. If  stress has been the cause of your high blood glucose, it can be followed regularly by your caregiver. That way he/she can make sure your hyperglycemia does not continue to get worse or progress to diabetes. °Steroids °· Steroids are medications that act on the infection fighting system (immune system) to block inflammation or infection. One side effect can be a rise in blood glucose. Most people can produce enough extra insulin to allow for this rise, but for those who cannot, steroids make blood glucose levels go even higher. It is not unusual for steroid treatments to "uncover" diabetes that is developing. It is not always possible to determine if the hyperglycemia will go away after the steroids are stopped. A special blood test called an A1c is sometimes done to determine if your blood glucose was elevated before the steroids were started. °SYMPTOMS °· Thirsty. °· Frequent urination. °· Dry mouth. °· Blurred vision. °· Tired or fatigue. °· Weakness. °· Sleepy. °· Tingling in feet or leg. °DIAGNOSIS  °Diagnosis is made by monitoring blood glucose in one or all of the following ways: °· A1c test. This is a chemical found in your blood. °· Fingerstick blood glucose monitoring. °· Laboratory results. °TREATMENT  °First, knowing the cause of the hyperglycemia is important before the hyperglycemia can be treated. Treatment may include, but is not be limited to: °· Education. °· Change or adjustment in medications. °· Change or adjustment in meal plan. °· Treatment for an illness, infection, etc. °· More frequent blood glucose monitoring. °· Change in exercise plan. °· Decreasing or stopping steroids. °· Lifestyle changes. °HOME CARE INSTRUCTIONS  °· Test your blood glucose as directed. °· Exercise regularly. Your caregiver will give you instructions about exercise. Pre-diabetes or diabetes which comes on with stress is helped by exercising. °· Eat wholesome, balanced meals. Eat often and at regular, fixed times. Your  caregiver or nutritionist will give you a meal plan to guide your sugar intake. °· Being at an ideal weight is important. If needed, losing as little as 10 to 15 pounds may help improve blood glucose levels. °SEEK MEDICAL CARE IF:  °· You have questions about medicine, activity, or diet. °· You continue to have symptoms (problems such as increased thirst, urination, or weight gain). °SEEK IMMEDIATE MEDICAL CARE IF:  °· You are vomiting or have diarrhea. °· Your breath smells fruity. °· You are breathing faster or slower. °· You are very sleepy or incoherent. °· You have numbness, tingling, or pain in your feet or hands. °· You have chest pain. °· Your symptoms get worse even though you have been following your caregiver's orders. °· If you have any other questions or concerns. °  °This information is not intended to replace advice given to you by your health care provider. Make sure you discuss any questions you have with your health care provider. °  °Document Released: 04/26/2001 Document Revised: 01/23/2012 Document Reviewed: 07/07/2015 °Elsevier Interactive Patient Education ©2016 Elsevier Inc. ° °

## 2015-08-29 NOTE — ED Notes (Signed)
Patient transported to X-ray 

## 2015-08-29 NOTE — ED Provider Notes (Signed)
-----------------------------------------   3:36 AM on 08/29/2015 -----------------------------------------   Blood pressure 149/100, pulse 85, temperature 98.1 F (36.7 C), temperature source Oral, resp. rate 18, height 5\' 9"  (1.753 m), weight 170 lb (77.111 kg), SpO2 97 %.  Assuming care from Dr. Karma Greaser.  In short, Henry Fuller is a 39 y.o. male with a chief complaint of Abdominal Pain .  Refer to the original H&P for additional details.  The current plan of care is to follow up the repeat blood work and xray and reassess the patient.  The patient received 2 L of normal saline. His blood sugar did improve into the 200s and his lactic acid did improve as well. The patient's white blood cell count did increase slightly. When I did go in to evaluate the patient he reports that the pain is about the same as it had been when the surgeon and previous physician was in the room. He reports that it did have some slight improvement with fluids but it is worsened again. I asked him how he felt about being discharged and the patient reports that that would be okay. The patient does have an appointment with GI in November but I informed him to call to see if he can get in and be seen sooner. The patient has no further questions and does feel okay with being discharged to home.   Loney Hering, MD 08/29/15 [phone]

## 2015-08-31 ENCOUNTER — Telehealth: Payer: Self-pay | Admitting: Family Medicine

## 2015-08-31 NOTE — Telephone Encounter (Signed)
Patient called back in regards to his ER visit.

## 2015-08-31 NOTE — Telephone Encounter (Signed)
I spoke with patient, he did get the message to stop the Metformin. He stated that he did not know what his glucose was, that is machine was not working. I advised him that it was very important to either fix his current one or get a new one asap to check his glucose so we can adjust his insulin.  He also was wondering about possibly getting referred to [facility] for a second GI appt. I advised him [facility] would be an even longer wait for an appt. I advised him if he'd like, he could go to the [facility] ED and be seen by the GI doctor on call there. And for him to call the other GI office every day to see if they had a cancellation.  He agreed with the plan and said he'd probably go ahead and head to [facility] ED to be seen there.

## 2015-08-31 NOTE — Telephone Encounter (Signed)
Late entry; I called yesterday evening; left detailed message to STOP meformin He had elevated lactic acid in ER on Friday night/Saturday morning; I did not see that they specifically stopped it, so I called and left message to STOP it for now until we talk later --------------------- AMY --> please call patient Make sure he got my message to STOP metformin See how his sugars have been and back to me to adjust his insulin

## 2015-09-02 ENCOUNTER — Telehealth: Payer: Self-pay | Admitting: Family Medicine

## 2015-09-02 NOTE — Telephone Encounter (Signed)
Pt would like to know if he should come in and be seen again since he hasn't been able to use the bathroom and his stomach is still bothering him.

## 2015-09-02 NOTE — Telephone Encounter (Signed)
He went to [facility], they did another xray and ct, they said that he did have a whole lot of stool, they gave him magnesium citrate, but he still hasn't had a bowel movement. Wants to know if there is something else he can take to get him to have a bowel movement.  Also wants to know if he get a note to be out of work because his stomach is hurting so much.

## 2015-09-02 NOTE — Telephone Encounter (Signed)
Patients wife notified

## 2015-09-02 NOTE — Telephone Encounter (Signed)
I am sorry to hear he's having this problem He can try something from below (like a Fleet's enema or dulcolax suppository), per the package directions Yes, he can be out of work; I did the [facility] paperwork for this, but we'll be glad to write him out today and tomorrow, okay for Friday if needed if he calls [place]

## 2015-09-04 ENCOUNTER — Ambulatory Visit (INDEPENDENT_AMBULATORY_CARE_PROVIDER_SITE_OTHER): Payer: BLUE CROSS/BLUE SHIELD | Admitting: Family Medicine

## 2015-09-04 ENCOUNTER — Encounter: Payer: Self-pay | Admitting: Family Medicine

## 2015-09-04 ENCOUNTER — Telehealth: Payer: Self-pay | Admitting: Gastroenterology

## 2015-09-04 ENCOUNTER — Emergency Department
Admission: EM | Admit: 2015-09-04 | Discharge: 2015-09-04 | Discharge status: Against Medical Advice | Payer: BLUE CROSS/BLUE SHIELD | Attending: Emergency Medicine | Admitting: Emergency Medicine

## 2015-09-04 ENCOUNTER — Encounter: Payer: Self-pay | Admitting: Emergency Medicine

## 2015-09-04 VITALS — BP 161/107 | HR 99 | Temp 97.7°F | Wt 173.0 lb

## 2015-09-04 DIAGNOSIS — R1084 Generalized abdominal pain: Secondary | ICD-10-CM

## 2015-09-04 DIAGNOSIS — E1165 Type 2 diabetes mellitus with hyperglycemia: Secondary | ICD-10-CM | POA: Diagnosis not present

## 2015-09-04 DIAGNOSIS — K2970 Gastritis, unspecified, without bleeding: Secondary | ICD-10-CM

## 2015-09-04 DIAGNOSIS — I10 Essential (primary) hypertension: Secondary | ICD-10-CM | POA: Insufficient documentation

## 2015-09-04 DIAGNOSIS — R110 Nausea: Secondary | ICD-10-CM | POA: Diagnosis not present

## 2015-09-04 DIAGNOSIS — R1013 Epigastric pain: Secondary | ICD-10-CM | POA: Diagnosis not present

## 2015-09-04 DIAGNOSIS — Z794 Long term (current) use of insulin: Secondary | ICD-10-CM

## 2015-09-04 DIAGNOSIS — Z720 Tobacco use: Secondary | ICD-10-CM | POA: Diagnosis not present

## 2015-09-04 DIAGNOSIS — K561 Intussusception: Secondary | ICD-10-CM | POA: Diagnosis not present

## 2015-09-04 DIAGNOSIS — R109 Unspecified abdominal pain: Secondary | ICD-10-CM | POA: Diagnosis present

## 2015-09-04 DIAGNOSIS — E119 Type 2 diabetes mellitus without complications: Secondary | ICD-10-CM | POA: Insufficient documentation

## 2015-09-04 DIAGNOSIS — B9681 Helicobacter pylori [H. pylori] as the cause of diseases classified elsewhere: Secondary | ICD-10-CM | POA: Diagnosis not present

## 2015-09-04 DIAGNOSIS — I7090 Unspecified atherosclerosis: Secondary | ICD-10-CM | POA: Diagnosis not present

## 2015-09-04 LAB — CBC WITH DIFFERENTIAL/PLATELET
HEMATOCRIT: 45.7 % (ref 37.5–51.0)
HEMOGLOBIN: 16.2 g/dL (ref 12.6–17.7)
LYMPHS: 32 %
Lymphocytes Absolute: 3.2 10*3/uL — ABNORMAL HIGH (ref 0.7–3.1)
MCH: 30.9 pg (ref 26.6–33.0)
MCHC: 35.4 g/dL (ref 31.5–35.7)
MCV: 87 fL (ref 79–97)
MID (Absolute): 1.2 10*3/uL (ref 0.1–1.6)
MID: 12 %
NEUTROS PCT: 56 %
Neutrophils Absolute: 5.6 10*3/uL (ref 1.4–7.0)
Platelets: 331 10*3/uL (ref 150–379)
RBC: 5.24 x10E6/uL (ref 4.14–5.80)
RDW: 42.6 % — AB (ref 12.3–15.4)
WBC: 10 10*3/uL (ref 3.4–10.8)

## 2015-09-04 LAB — GLUCOSE HEMOCUE WAIVED: Glu Hemocue Waived: 406 mg/dL — ABNORMAL HIGH (ref 65–99)

## 2015-09-04 MED ORDER — OMEPRAZOLE 40 MG PO CPDR: 40.0000 mg | DELAYED_RELEASE_CAPSULE | Freq: Two times a day (BID) | ORAL | Status: DC

## 2015-09-04 MED ORDER — SUCRALFATE 1 GM/10ML PO SUSP: 1.0000 g | Freq: Three times a day (TID) | ORAL | Status: DC

## 2015-09-04 NOTE — Assessment & Plan Note (Addendum)
Check glucose now, very high; he has not taken his insulin today; I believe his diabetes behaves like type 1.5 DM (Marlaysia Lenig) and I have stopped his metformin while he is ill; encouraged him to take some insulin every day, even if just five units in the morning of the 70/30 insulin; reviewed his previous lactic acid levels; he really needs to get in to see endocrinologist

## 2015-09-04 NOTE — Telephone Encounter (Signed)
Left message for Tiffany to call back.  

## 2015-09-04 NOTE — Assessment & Plan Note (Signed)
Check labs 

## 2015-09-04 NOTE — ED Notes (Signed)
Pt to ED with c/o epigastric abd. Pain with nausea, states he was seen last week for same last week and at [facility] on Monday, states he saw his PCP today and was told to go to the ED to see a GI Dr. , pt has appointment with GI Dr. On 11/10 but states he continues to have pain

## 2015-09-04 NOTE — Progress Notes (Signed)
BP 161/107 mmHg  Pulse 99  Temp(Src) 97.7 F (36.5 C)  Wt 173 lb (78.472 kg)  SpO2 96%   Subjective:    Patient ID: Henry Fuller, male    DOB: 1976-07-25, 39 y.o.   MRN: [id number]  HPI: Henry Fuller is a 39 y.o. male  Chief Complaint  Patient presents with  . Constipation    He has had some very small bowel movements but still constipated x 9-10 days. Also having nausea now.   Patient is here for an acute visit for ongoing abdominal pain He went to [facility] ER on Monday, [facility] on [place]; I cannot find his data in [place], but his wife has his information in her cell phone and we were able to review his labs and CT scan through his MyChart account with [facility] His WBC was still high, and lactate was high They did scans; his abdominal pain is still located in the epigastric area; he is scared because people have said he might die from this He took mag citrate Monday and Wednesday, whole bottle each time on Mon and Wed Went to bed on Wed and didn't do anything; went to work on Thursday Smells make his stomach ache; something at work makes his stomach ache His pain is about a 5 or 6 out of 10 right now  He has poorly controlled diabetes mellitus; he has not been in to see the endocrinologist I asked about his sugars; he says his meter is broke; keeps saying error H was supposed to be taking 70/30 insulin 15 units twice a day but he felt like that was too much; he backed it down to 12 units twice a day; however, he hasn't taken any insulin at all today He is going to see endo soon he says  He is not taking the new BP pills yet he says  He has not started the new cholesterol medicine  Relevant past medical, surgical, family and social history reviewed and updated as indicated. Interim medical history since our last visit reviewed. Allergies and medications reviewed and updated.  Review of Systems Per HPI unless specifically indicated above      Objective:    BP 161/107 mmHg  Pulse 99  Temp(Src) 97.7 F (36.5 C)  Wt 173 lb (78.472 kg)  SpO2 96%  Wt Readings from Last 3 Encounters:  09/04/15 170 lb (77.111 kg)  09/04/15 173 lb (78.472 kg)  08/28/15 170 lb (77.111 kg)    Physical Exam  Constitutional: He appears well-developed and well-nourished.  Non-toxic appearance. He does not have a sickly appearance.  Appears to be in moderate pain; face is flushed  HENT:  Mouth/Throat: Mucous membranes are normal.  Cardiovascular: Normal rate and regular rhythm.   Pulmonary/Chest: Effort normal and breath sounds normal.  Abdominal: Bowel sounds are normal. He exhibits no distension. There is no hepatosplenomegaly. There is tenderness in the epigastric area. There is guarding. There is no rebound.  Neurological: He displays no tremor. No sensory deficit.  Skin: He is not diaphoretic. No pallor.  Facial flushing  Psychiatric:  Fair eye contact; cooperative; appears slightly anxious   Diabetic Foot Form - Detailed   Diabetic Foot Exam - detailed  Diabetic Foot exam was performed with the following findings:  Yes 09/04/2015  2:14 PM  Visual Foot Exam completed.:  Yes  Are the toenails long?:  No  Are the toenails thick?:  No  Are the toenails ingrown?:  No    Pulse  Foot Exam completed.:  Yes  Right Dorsalis Pedis:  Present Left Dorsalis Pedis:  Present  Sensory Foot Exam Completed.:  Yes  Swelling:  No  Semmes-Weinstein Monofilament Test  R Site 1-Great Toe:  Pos L Site 1-Great Toe:  Pos  R Site 4:  Pos L Site 4:  Pos  R Site 5:  Pos L Site 5:  Pos        Results for orders placed or performed in visit on 09/04/15  CBC With Differential/Platelet  Result Value Ref Range   WBC 10.0 3.4 - 10.8 x10E3/uL   RBC 5.24 4.14 - 5.80 x10E6/uL   Hemoglobin 16.2 12.6 - 17.7 g/dL   Hematocrit 45.7 37.5 - 51.0 %   MCV 87 79 - 97 fL   MCH 30.9 26.6 - 33.0 pg   MCHC 35.4 31.5 - 35.7 g/dL   RDW 42.6 (H) 12.3 - 15.4 %   Platelets 331  150 - 379 x10E3/uL   Neutrophils 56 %   Lymphs 32 %   MID 12 %   Neutrophils Absolute 5.6 1.4 - 7.0 x10E3/uL   Lymphocytes Absolute 3.2 (H) 0.7 - 3.1 x10E3/uL   MID (Absolute) 1.2 0.1 - 1.6 X10E3/uL  Glucose Hemocue Waived  Result Value Ref Range   Glu Hemocue Waived 406 (H) 65 - 99 mg/dL      Assessment & Plan:   Problem List Items Addressed This Visit      Cardiovascular and Mediastinum   Essential hypertension, benign    Uncontrolled; he is not taking his BP medicine as directed apparently; understandably, being in pain can increase his blood pressure, but smoking and noncompliance are contributing      Atherosclerosis    Premature atherosclerosis noted on CT scan; discussed with patient by phone earlier, and brought up again today with patient and wife present; he has not started the cholesterol medicine; stressed the importance of getting his diabetes under control, giving up his smoking; he is concerned about starting the cholesterol medicine now while he has ongoing abd pain; I understand his reticence, and since this is a slow process, when his abdominal pain improves, then definitely start the statin        Digestive   Helicobacter pylori gastritis    Awaiting evaluation by gastroenterologist; he has significant pain, ongoing, with two recent ER visits in the last week; I cannot say whether or not he doesn't have an ulcer that isn't healing; noncompliance with medication may also be a factor, as he has not been compliant with other meds in the past (or currently, even), so he may not have taken his antibiotics correctly; I really want him to see GI ASAP; we talked about risk of ulcer, and he will go to ER      Intussusception of small bowel ([place])    Noted on previous CT scan at [facility], but not reported on CT scan done in [place]; this may be recurring causing part of his pain; he is in significant pain now and is flushed; he will return to the ER and hopefully be evaluated  by GI ASAP; I did confirm with [place] that we have GI on-call available        Endocrine   Type 2 diabetes mellitus, uncontrolled ([place])    Check glucose now, very high; he has not taken his insulin today; I believe his diabetes behaves like type 1.5 DM (Kylina Vultaggio) and I have stopped his metformin while he is ill; encouraged him to  take some insulin every day, even if just five units in the morning of the 70/30 insulin; reviewed his previous lactic acid levels; he really needs to get in to see endocrinologist      Relevant Orders   Glucose Hemocue Waived (Completed)     Other   Tobacco abuse    Stressed to the patient the importance of smoking cessation; with his wife present, showed model of artery in cross section with atherosclerosis; explained risk of blockages when diabetics smoke, just accelerating the process which can lead to heart attacks and strokes; urged him to [place] today, to let today be his last cigarette; he will think about it      Generalized abdominal pain - Primary    Check labs      Relevant Orders   CBC With Differential/Platelet (Completed)      Follow up plan: No Follow-up on file. Per ER recommendations  Meds ordered this encounter  Medications  . sucralfate (CARAFATE) 1 GM/10ML suspension    Sig: Take 10 mLs (1 g total) by mouth 4 (four) times daily -  with meals and at bedtime.    Dispense:  420 mL    Refill:  0  . omeprazole (PRILOSEC) 40 MG capsule    Sig: Take 1 capsule (40 mg total) by mouth 2 (two) times daily. One by mouth daily    Dispense:  60 capsule    Refill:  0

## 2015-09-04 NOTE — Telephone Encounter (Signed)
Tiffany states that patient needs to be seen ASAP for EGD. No GI hx. Gastritis vs Ulcer. Tiffany states that patient has not had a bm in over a week. Patient has appt with [facility] surgical in November but is wanting to know if we can get patient in any sooner?

## 2015-09-07 ENCOUNTER — Ambulatory Visit: Payer: BLUE CROSS/BLUE SHIELD | Admitting: Family Medicine

## 2015-09-08 NOTE — Telephone Encounter (Signed)
Message left with Tiffany that first available with Dr Ardis Hughs is 11/13/15, pt has appt with [facility] on 09/15/15.  I advised Tiffany to call if she has further questions.

## 2015-09-09 NOTE — Assessment & Plan Note (Signed)
Awaiting evaluation by gastroenterologist; he has significant pain, ongoing, with two recent ER visits in the last week; I cannot say whether or not he doesn't have an ulcer that isn't healing; noncompliance with medication may also be a factor, as he has not been compliant with other meds in the past (or currently, even), so he may not have taken his antibiotics correctly; I really want him to see GI ASAP; we talked about risk of ulcer, and he will go to ER

## 2015-09-09 NOTE — Assessment & Plan Note (Signed)
Uncontrolled; he is not taking his BP medicine as directed apparently; understandably, being in pain can increase his blood pressure, but smoking and noncompliance are contributing

## 2015-09-09 NOTE — Assessment & Plan Note (Addendum)
Stressed to the patient the importance of smoking cessation; with his wife present, showed model of artery in cross section with atherosclerosis; explained risk of blockages when diabetics smoke, just accelerating the process which can lead to heart attacks and strokes; urged him to [place] today, to let today be his last cigarette; he will think about it; smoking cessation counseling of at least 3 minutes today

## 2015-09-09 NOTE — Assessment & Plan Note (Signed)
Premature atherosclerosis noted on CT scan; discussed with patient by phone earlier, and brought up again today with patient and wife present; he has not started the cholesterol medicine; stressed the importance of getting his diabetes under control, giving up his smoking; he is concerned about starting the cholesterol medicine now while he has ongoing abd pain; I understand his reticence, and since this is a slow process, when his abdominal pain improves, then definitely start the statin

## 2015-09-09 NOTE — Assessment & Plan Note (Signed)
Noted on previous CT scan at [facility], but not reported on CT scan done in [place]; this may be recurring causing part of his pain; he is in significant pain now and is flushed; he will return to the ER and hopefully be evaluated by GI ASAP; I did confirm with [place] that we have GI on-call available

## 2015-09-14 ENCOUNTER — Telehealth: Payer: Self-pay | Admitting: Family Medicine

## 2015-09-14 NOTE — Telephone Encounter (Signed)
I spoke with wife; paperwork all ready; it was confusing because it was not FMLA paperwork, which is what I thought they wanted; paperwork filled out, faxed as requested and confirmed

## 2015-09-14 NOTE — Telephone Encounter (Signed)
Dr. Sanda Klein, his paperwork was in your blue in basket earlier.

## 2015-09-14 NOTE — Telephone Encounter (Signed)
Pt would like a call so he can get some information for his short term disability form for work

## 2015-09-15 ENCOUNTER — Ambulatory Visit (INDEPENDENT_AMBULATORY_CARE_PROVIDER_SITE_OTHER): Payer: BLUE CROSS/BLUE SHIELD | Admitting: Gastroenterology

## 2015-09-15 ENCOUNTER — Other Ambulatory Visit: Payer: Self-pay

## 2015-09-15 ENCOUNTER — Encounter: Payer: Self-pay | Admitting: Gastroenterology

## 2015-09-15 VITALS — BP 152/94 | HR 92 | Temp 98.2°F | Ht 69.0 in | Wt 169.4 lb

## 2015-09-15 DIAGNOSIS — R634 Abnormal weight loss: Secondary | ICD-10-CM | POA: Diagnosis not present

## 2015-09-15 DIAGNOSIS — R6881 Early satiety: Secondary | ICD-10-CM

## 2015-09-15 DIAGNOSIS — K5909 Other constipation: Secondary | ICD-10-CM

## 2015-09-15 DIAGNOSIS — K5900 Constipation, unspecified: Secondary | ICD-10-CM

## 2015-09-15 NOTE — Progress Notes (Signed)
Gastroenterology Consultation  Referring Provider:     Arnetha Courser, MD Primary Care Physician:  Golden Pop, MD Primary Gastroenterologist:  Dr. Allen Norris     Reason for Consultation:     Early satiety and weight loss with constipation        HPI:   Henry Fuller is a 39 y.o. y/o male referred for consultation & management of early satiety and weight loss with constipation by Dr. Golden Pop, MD.  This patient comes today with a history of a father with colon cancer at the age of 2. The patient now reports that he has lost a significant amount of weight and all of his close are now much more loosely fitting. The patient also reports that he has seen some blood per rectum but has been constipated. The patient also states that he is not able to eat a large amount of food before getting full quickly. There is no report of any diarrhea and he states his constipation is pretty significant. The patient reports that he has not usually cognizant of his weight although the people at his work told him he looks sick and that he looks like he is losing weight. The patient also reports some diffuse abdominal cramping and bloating.  Past Medical History  Diagnosis Date  . Diabetes mellitus without complication ([place])   . Hyperlipidemia   . Hypertension   . Hypogonadism in male   . Vitamin D deficiency   . Erectile dysfunction     Past Surgical History  Procedure Laterality Date  . Vasectomy  2001  . Hernia repair  0973    umbilical    Prior to Admission medications   Medication Sig Start Date End Date Taking? Authorizing Provider  atorvastatin (LIPITOR) 10 MG tablet Take 1 tablet (10 mg total) by mouth at bedtime. Patient not taking: Reported on 09/15/2015 08/28/15   Arnetha Courser, MD  benazepril-hydrochlorthiazide (LOTENSIN HCT) 20-12.5 MG tablet Take 1 tablet by mouth daily. New strength Patient not taking: Reported on 09/15/2015 08/24/15   Arnetha Courser, MD  fluticasone  (FLONASE) 50 MCG/ACT nasal spray Place 2 sprays into both nostrils daily.    Historical Provider, MD  insulin NPH-regular Human (NOVOLIN 70/30) (70-30) 100 UNIT/ML injection Inject 12 Units into the skin 2 (two) times daily with a meal.    Historical Provider, MD  omeprazole (PRILOSEC) 40 MG capsule Take 1 capsule (40 mg total) by mouth 2 (two) times daily. One by mouth daily Patient not taking: Reported on 09/15/2015 09/04/15   Arnetha Courser, MD  ondansetron (ZOFRAN ODT) 4 MG disintegrating tablet Take 1 tablet (4 mg total) by mouth every 8 (eight) hours as needed for nausea or vomiting. Patient not taking: Reported on 09/15/2015 08/29/15   Loney Hering, MD  sucralfate (CARAFATE) 1 GM/10ML suspension Take 10 mLs (1 g total) by mouth 4 (four) times daily -  with meals and at bedtime. Patient not taking: Reported on 09/15/2015 09/04/15   Arnetha Courser, MD    Family History  Problem Relation Age of Onset  . Diabetes Mother   . Hypertension Mother   . Cancer Father     colon cancer  . Diabetes Maternal Grandmother   . Hypertension Maternal Grandmother   . Liver disease Maternal Grandfather   . Cancer Paternal Grandmother   . Stroke Paternal Grandfather      Social History  Substance Use Topics  . Smoking status: Current Every Day Smoker --  0.50 packs/day    Types: Cigarettes  . Smokeless tobacco: Never Used  . Alcohol Use: No    Allergies as of 09/15/2015 - Review Complete 09/15/2015  Allergen Reaction Noted  . Atenolol Other (See Comments) 12/31/2014    Review of Systems:    All systems reviewed and negative except where noted in HPI.   Physical Exam:  BP 152/94 mmHg  Pulse 92  Temp(Src) 98.2 F (36.8 C) (Oral)  Ht 5\' 9"  (1.753 m)  Wt 169 lb 6.4 oz (76.839 kg)  BMI 25.00 kg/m2 No LMP for male patient. Psych:  Alert and cooperative. Normal mood and affect. General:   Alert,  Well-developed, well-nourished, pleasant and cooperative in NAD Head:  Normocephalic and  atraumatic. Eyes:  Sclera clear, no icterus.   Conjunctiva pink. Ears:  Normal auditory acuity. Nose:  No deformity, discharge, or lesions. Mouth:  No deformity or lesions,oropharynx pink & moist. Neck:  Supple; no masses or thyromegaly. Lungs:  Respirations even and unlabored.  Clear throughout to auscultation.   No wheezes, crackles, or rhonchi. No acute distress. Heart:  Regular rate and rhythm; no murmurs, clicks, rubs, or gallops. Abdomen:  Normal bowel sounds.  No bruits.  Soft, non-tender and non-distended without masses, hepatosplenomegaly or hernias noted.  No guarding or rebound tenderness.  Negative Carnett sign.   Rectal:  Deferred.  Msk:  Symmetrical without gross deformities.  Good, equal movement & strength bilaterally. Pulses:  Normal pulses noted. Extremities:  No clubbing or edema.  No cyanosis. Neurologic:  Alert and oriented x3;  grossly normal neurologically. Skin:  Intact without significant lesions or rashes.  No jaundice. Lymph Nodes:  No significant cervical adenopathy. Psych:  Alert and cooperative. Normal mood and affect.  Imaging Studies: Ct Abdomen Pelvis W Contrast  08/27/2015  CLINICAL DATA:  Abdominal pain, chronic and epigastric. Weight loss. Blood in stool. EXAM: CT ABDOMEN AND PELVIS WITH CONTRAST TECHNIQUE: Multidetector CT imaging of the abdomen and pelvis was performed using the standard protocol following bolus administration of intravenous contrast. CONTRAST:  19mL OMNIPAQUE IOHEXOL 300 MG/ML  SOLN COMPARISON:  None. FINDINGS: Lower chest and abdominal wall:  No contributory findings. Hepatobiliary: No focal liver abnormality.No evidence of biliary obstruction or stone. Pancreas: Unremarkable. Spleen: Unremarkable. Adrenals/Urinary Tract: Negative adrenals. No hydronephrosis or stone. Unremarkable bladder. Reproductive:Early vas deferens calcification correlating with diabetes history. Stomach/Bowel: There is a short segment (roughly 3 cm long)  enteroenteric intussusception in the left upper quadrant. On coronal reformats possible additional even shorter segment intussusception on series 5, image 50. No lead point adenopathy or bowel wall mass is identified. No associated obstruction. No indication of colitis. No appendicitis. Vascular/Lymphatic: Premature atherosclerosis and medium vessel calcification. No acute arterial finding or visible flow limiting stenosis. No mass or adenopathy. Peritoneal: No ascites or pneumoperitoneum. Musculoskeletal: No acute finding.  No sacroiliitis. IMPRESSION: 1. One or 2 short-segment and nonobstructing enteroenteric intussusceptions. These are often transient and incidental but given the history note that there is association with celiac disease. 2. Premature atherosclerosis. Electronically Signed   By: Monte Fantasia M.D.   On: 08/27/2015 08:57   Dg Abd Acute W/chest  08/29/2015  CLINICAL DATA:  Upper mid abdominal pain for 2 months. Nausea. Elevated lactate. CT yesterday showed intussusception. EXAM: DG ABDOMEN ACUTE W/ 1V CHEST COMPARISON:  CT abdomen and pelvis 08/27/2015 FINDINGS: Shallow inspiration. Normal heart size and pulmonary vascularity. No focal airspace disease or consolidation in the lungs. No blunting of costophrenic angles. No pneumothorax. Mediastinal contours appear  intact. Scattered gas and stool in the colon. Residual contrast material throughout the colon and in the appendix. No small or large bowel distention. No free intra-abdominal air. No abnormal air-fluid levels. No radiopaque stones. Visualized bones appear intact. IMPRESSION: No evidence of active pulmonary disease. Normal nonobstructive bowel gas pattern with residual contrast material in the colon. Electronically Signed   By: Lucienne Capers M.D.   On: 08/29/2015 00:13    Assessment and Plan:   Henry Fuller is a 39 y.o. y/o male with a weight loss of approximately 25 pounds. The patient also reports rectal bleeding  with constipation and early satiety. The patient will be set up for an EGD and colonoscopy due to the symptoms. He also has a history of colon cancer in his father around the age of 10.I have discussed risks & benefits which include, but are not limited to, bleeding, infection, perforation & drug reaction.  The patient agrees with this plan & written consent will be obtained.      Note: This dictation was prepared with Dragon dictation along with smaller phrase technology. Any transcriptional errors that result from this process are unintentional.

## 2015-09-16 NOTE — Discharge Instructions (Signed)

## 2015-09-17 ENCOUNTER — Ambulatory Visit: Payer: BLUE CROSS/BLUE SHIELD | Admitting: Anesthesiology

## 2015-09-17 ENCOUNTER — Encounter
Admission: RE | Disposition: A | Discharge status: Home / Self Care | Payer: Self-pay | Source: Ambulatory Visit | Attending: Gastroenterology

## 2015-09-17 ENCOUNTER — Ambulatory Visit
Admission: RE | Admit: 2015-09-17 | Discharge: 2015-09-17 | Disposition: A | Discharge status: Home / Self Care | Payer: BLUE CROSS/BLUE SHIELD | Source: Ambulatory Visit | Attending: Gastroenterology | Admitting: Gastroenterology

## 2015-09-17 ENCOUNTER — Other Ambulatory Visit: Payer: Self-pay | Admitting: Gastroenterology

## 2015-09-17 DIAGNOSIS — K640 First degree hemorrhoids: Secondary | ICD-10-CM | POA: Diagnosis not present

## 2015-09-17 DIAGNOSIS — R634 Abnormal weight loss: Secondary | ICD-10-CM | POA: Insufficient documentation

## 2015-09-17 DIAGNOSIS — E119 Type 2 diabetes mellitus without complications: Secondary | ICD-10-CM | POA: Insufficient documentation

## 2015-09-17 DIAGNOSIS — K625 Hemorrhage of anus and rectum: Secondary | ICD-10-CM | POA: Diagnosis not present

## 2015-09-17 DIAGNOSIS — Z8379 Family history of other diseases of the digestive system: Secondary | ICD-10-CM | POA: Insufficient documentation

## 2015-09-17 DIAGNOSIS — Z7951 Long term (current) use of inhaled steroids: Secondary | ICD-10-CM | POA: Insufficient documentation

## 2015-09-17 DIAGNOSIS — Z800 Family history of malignant neoplasm of digestive organs: Secondary | ICD-10-CM | POA: Diagnosis not present

## 2015-09-17 DIAGNOSIS — K219 Gastro-esophageal reflux disease without esophagitis: Secondary | ICD-10-CM | POA: Diagnosis not present

## 2015-09-17 DIAGNOSIS — E785 Hyperlipidemia, unspecified: Secondary | ICD-10-CM | POA: Insufficient documentation

## 2015-09-17 DIAGNOSIS — Z794 Long term (current) use of insulin: Secondary | ICD-10-CM | POA: Diagnosis not present

## 2015-09-17 DIAGNOSIS — Z823 Family history of stroke: Secondary | ICD-10-CM | POA: Insufficient documentation

## 2015-09-17 DIAGNOSIS — F17210 Nicotine dependence, cigarettes, uncomplicated: Secondary | ICD-10-CM | POA: Insufficient documentation

## 2015-09-17 DIAGNOSIS — Z8249 Family history of ischemic heart disease and other diseases of the circulatory system: Secondary | ICD-10-CM | POA: Diagnosis not present

## 2015-09-17 DIAGNOSIS — Z888 Allergy status to other drugs, medicaments and biological substances status: Secondary | ICD-10-CM | POA: Insufficient documentation

## 2015-09-17 DIAGNOSIS — Z833 Family history of diabetes mellitus: Secondary | ICD-10-CM | POA: Diagnosis not present

## 2015-09-17 DIAGNOSIS — Z79899 Other long term (current) drug therapy: Secondary | ICD-10-CM | POA: Diagnosis not present

## 2015-09-17 DIAGNOSIS — K2980 Duodenitis without bleeding: Secondary | ICD-10-CM | POA: Diagnosis not present

## 2015-09-17 DIAGNOSIS — K2970 Gastritis, unspecified, without bleeding: Secondary | ICD-10-CM | POA: Diagnosis not present

## 2015-09-17 DIAGNOSIS — I10 Essential (primary) hypertension: Secondary | ICD-10-CM | POA: Diagnosis not present

## 2015-09-17 HISTORY — DX: Gastro-esophageal reflux disease without esophagitis: K21.9

## 2015-09-17 HISTORY — PX: ESOPHAGOGASTRODUODENOSCOPY (EGD) WITH PROPOFOL: SHX5813

## 2015-09-17 HISTORY — PX: COLONOSCOPY WITH PROPOFOL: SHX5780

## 2015-09-17 HISTORY — DX: Other specified disorders of teeth and supporting structures: K08.89

## 2015-09-17 LAB — GLUCOSE, CAPILLARY
GLUCOSE-CAPILLARY: 262 mg/dL — AB (ref 65–99)
GLUCOSE-CAPILLARY: 282 mg/dL — AB (ref 65–99)

## 2015-09-17 SURGERY — COLONOSCOPY WITH PROPOFOL
Anesthesia: Monitor Anesthesia Care | Wound class: Contaminated

## 2015-09-17 MED ORDER — SODIUM CHLORIDE 0.9 % IV SOLN: INTRAVENOUS | Status: DC

## 2015-09-17 MED ORDER — PROMETHAZINE HCL 25 MG/ML IJ SOLN
6.2500 mg | INTRAMUSCULAR | Status: DC | PRN
Start: 2015-09-17 — End: 2015-09-17

## 2015-09-17 MED ORDER — OXYCODONE HCL 5 MG PO TABS: 5.0000 mg | ORAL_TABLET | Freq: Once | ORAL | Status: DC | PRN

## 2015-09-17 MED ORDER — MEPERIDINE HCL 25 MG/ML IJ SOLN: 6.2500 mg | INTRAMUSCULAR | Status: DC | PRN

## 2015-09-17 MED ORDER — LACTATED RINGERS IV SOLN
INTRAVENOUS | Status: DC
  Administered 2015-09-17: 08:00:00 via INTRAVENOUS

## 2015-09-17 MED ORDER — GLYCOPYRROLATE 0.2 MG/ML IJ SOLN
INTRAMUSCULAR | Status: DC | PRN
  Administered 2015-09-17: 0.1 mg via INTRAVENOUS

## 2015-09-17 MED ORDER — OXYCODONE HCL 5 MG/5ML PO SOLN: 5.0000 mg | Freq: Once | ORAL | Status: DC | PRN

## 2015-09-17 MED ORDER — PROPOFOL 10 MG/ML IV BOLUS
INTRAVENOUS | Status: DC | PRN
  Administered 2015-09-17 (×3): 50 mg via INTRAVENOUS
  Administered 2015-09-17: 100 mg via INTRAVENOUS
  Administered 2015-09-17 (×4): 50 mg via INTRAVENOUS

## 2015-09-17 MED ORDER — HYDROMORPHONE HCL 1 MG/ML IJ SOLN: 0.2500 mg | INTRAMUSCULAR | Status: DC | PRN

## 2015-09-17 MED ORDER — LIDOCAINE HCL (CARDIAC) 20 MG/ML IV SOLN
INTRAVENOUS | Status: DC | PRN
  Administered 2015-09-17: 50 mg via INTRAVENOUS

## 2015-09-17 MED ORDER — STERILE WATER FOR IRRIGATION IR SOLN
Status: DC | PRN
  Administered 2015-09-17: 5 mL

## 2015-09-17 SURGICAL SUPPLY — 39 items
BALLN DILATOR 10-12 8 (BALLOONS)
BALLN DILATOR 12-15 8 (BALLOONS)
BALLN DILATOR 15-18 8 (BALLOONS)
BALLN DILATOR CRE 0-12 8 (BALLOONS)
BALLN DILATOR ESOPH 8 10 CRE (MISCELLANEOUS) IMPLANT
BALLOON DILATOR 12-15 8 (BALLOONS) IMPLANT
BALLOON DILATOR 15-18 8 (BALLOONS) IMPLANT
BALLOON DILATOR CRE 0-12 8 (BALLOONS) IMPLANT
BLOCK BITE 60FR ADLT L/F GRN (MISCELLANEOUS) ×2 IMPLANT
CANISTER SUCT 1200ML W/VALVE (MISCELLANEOUS) ×2 IMPLANT
FCP ESCP3.2XJMB 240X2.8X (MISCELLANEOUS)
FORCEPS BIOP RAD 4 LRG CAP 4 (CUTTING FORCEPS) ×2 IMPLANT
FORCEPS BIOP RJ4 240 W/NDL (MISCELLANEOUS)
FORCEPS ESCP3.2XJMB 240X2.8X (MISCELLANEOUS) IMPLANT
GOWN CVR UNV OPN BCK APRN NK (MISCELLANEOUS) ×2 IMPLANT
GOWN ISOL THUMB LOOP REG UNIV (MISCELLANEOUS) ×2
HEMOCLIP INSTINCT (CLIP) IMPLANT
INJECTOR VARIJECT VIN23 (MISCELLANEOUS) IMPLANT
KIT CO2 TUBING (TUBING) IMPLANT
KIT DEFENDO VALVE AND CONN (KITS) IMPLANT
KIT ENDO PROCEDURE OLY (KITS) ×2 IMPLANT
LIGATOR MULTIBAND 6SHOOTER MBL (MISCELLANEOUS) IMPLANT
MARKER SPOT ENDO TATTOO 5ML (MISCELLANEOUS) IMPLANT
PAD GROUND ADULT SPLIT (MISCELLANEOUS) IMPLANT
SNARE SHORT THROW 13M SML OVAL (MISCELLANEOUS) IMPLANT
SNARE SHORT THROW 30M LRG OVAL (MISCELLANEOUS) IMPLANT
SPOT EX ENDOSCOPIC TATTOO (MISCELLANEOUS)
SUCTION POLY TRAP 4CHAMBER (MISCELLANEOUS) IMPLANT
SYR INFLATION 60ML (SYRINGE) IMPLANT
TRAP SUCTION POLY (MISCELLANEOUS) IMPLANT
TUBING CONN 6MMX3.1M (TUBING)
TUBING SUCTION CONN 0.25 STRL (TUBING) IMPLANT
UNDERPAD 30X60 958B10 (PK) (MISCELLANEOUS) IMPLANT
VALVE BIOPSY ENDO (VALVE) IMPLANT
VARIJECT INJECTOR VIN23 (MISCELLANEOUS)
WATER AUXILLARY (MISCELLANEOUS) IMPLANT
WATER STERILE IRR 250ML POUR (IV SOLUTION) ×2 IMPLANT
WATER STERILE IRR 500ML POUR (IV SOLUTION) IMPLANT
WIRE CRE 18-20MM 8CM F G (MISCELLANEOUS) IMPLANT

## 2015-09-17 NOTE — Anesthesia Procedure Notes (Signed)
Procedure Name: MAC Performed by: Venie Montesinos Pre-anesthesia Checklist: Patient identified, Emergency Drugs available, Suction available, Timeout performed and Patient being monitored Patient Re-evaluated:Patient Re-evaluated prior to inductionOxygen Delivery Method: Nasal cannula Placement Confirmation: positive ETCO2     

## 2015-09-17 NOTE — H&P (Signed)
[facility]  [address], [place], [place] [address] Phone: [phone] Fax : [phone]  Primary Care Physician:  Golden Pop, MD Primary Gastroenterologist:  Dr. Allen Norris  Pre-Procedure History & Physical: HPI:  Henry Fuller is a 39 y.o. male is here for an endoscopy and colonoscopy.   Past Medical History  Diagnosis Date  . Hyperlipidemia   . Hypogonadism in male   . Vitamin D deficiency   . Erectile dysfunction   . Hypertension     controlled on meds  . Diabetes mellitus without complication ([place])     type 2  . Loose tooth due to trauma     front  . GERD (gastroesophageal reflux disease)     Past Surgical History  Procedure Laterality Date  . Vasectomy  2001  . Hernia repair  3785    umbilical    Prior to Admission medications   Medication Sig Start Date End Date Taking? Authorizing Provider  atorvastatin (LIPITOR) 10 MG tablet Take 1 tablet (10 mg total) by mouth at bedtime. Patient not taking: Reported on 09/15/2015 08/28/15   Arnetha Courser, MD  benazepril-hydrochlorthiazide (LOTENSIN HCT) 20-12.5 MG tablet Take 1 tablet by mouth daily. New strength Patient not taking: Reported on 09/15/2015 08/24/15   Arnetha Courser, MD  fluticasone (FLONASE) 50 MCG/ACT nasal spray Place 2 sprays into both nostrils as needed.     Historical Provider, MD  insulin NPH-regular Human (NOVOLIN 70/30) (70-30) 100 UNIT/ML injection Inject 12 Units into the skin 2 (two) times daily with a meal.    Historical Provider, MD  omeprazole (PRILOSEC) 40 MG capsule Take 1 capsule (40 mg total) by mouth 2 (two) times daily. One by mouth daily Patient not taking: Reported on 09/15/2015 09/04/15   Arnetha Courser, MD  ondansetron (ZOFRAN ODT) 4 MG disintegrating tablet Take 1 tablet (4 mg total) by mouth every 8 (eight) hours as needed for nausea or vomiting. Patient not taking: Reported on 09/15/2015 08/29/15   Loney Hering, MD  sucralfate (CARAFATE) 1 GM/10ML  suspension Take 10 mLs (1 g total) by mouth 4 (four) times daily -  with meals and at bedtime. Patient not taking: Reported on 09/15/2015 09/04/15   Arnetha Courser, MD    Allergies as of 09/15/2015 - Review Complete 09/15/2015  Allergen Reaction Noted  . Atenolol Other (See Comments) 12/31/2014    Family History  Problem Relation Age of Onset  . Diabetes Mother   . Hypertension Mother   . Cancer Father     colon cancer  . Diabetes Maternal Grandmother   . Hypertension Maternal Grandmother   . Liver disease Maternal Grandfather   . Cancer Paternal Grandmother   . Stroke Paternal Grandfather     Social History   Social History  . Marital Status: Married    Spouse Name: N/A  . Number of Children: N/A  . Years of Education: N/A   Occupational History  . Not on file.   Social History Main Topics  . Smoking status: Current Every Day Smoker -- 0.50 packs/day for 9 years    Types: Cigarettes  . Smokeless tobacco: Never Used  . Alcohol Use: Yes     Comment: socially   . Drug Use: No  . Sexual Activity: Yes   Other Topics Concern  . Not on file   Social History Narrative    Review of Systems: See HPI, otherwise negative ROS  Physical Exam: BP 148/106 mmHg  Pulse 83  Temp(Src) 97.5 F (36.4 C) (Temporal)  Resp 15  Ht 5\' 9"  (1.753 m)  Wt 166 lb (75.297 kg)  BMI 24.50 kg/m2  SpO2 97% General:   Alert,  pleasant and cooperative in NAD Head:  Normocephalic and atraumatic. Neck:  Supple; no masses or thyromegaly. Lungs:  Clear throughout to auscultation.    Heart:  Regular rate and rhythm. Abdomen:  Soft, nontender and nondistended. Normal bowel sounds, without guarding, and without rebound.   Neurologic:  Alert and  oriented x4;  grossly normal neurologically.  Impression/Plan: Henry Fuller is here for an endoscopy and colonoscopy to be performed for rectal bleeding Weight loss  H pylori gastritis.  Risks, benefits, limitations, and alternatives  regarding  endoscopy and colonoscopy have been reviewed with the patient.  Questions have been answered.  All parties agreeable.   Ollen Bowl, MD  09/17/2015, 7:33 AM

## 2015-09-17 NOTE — Transfer of Care (Signed)
Immediate Anesthesia Transfer of Care Note  Patient: Henry Fuller  Procedure(s) Performed: Procedure(s) with comments: COLONOSCOPY WITH PROPOFOL (N/A) - DIABETIC ESOPHAGOGASTRODUODENOSCOPY (EGD) WITH PROPOFOL (N/A)  Patient Location: PACU  Anesthesia Type: MAC  Level of Consciousness: awake, alert  and patient cooperative  Airway and Oxygen Therapy: Patient Spontanous Breathing and Patient connected to supplemental oxygen  Post-op Assessment: Post-op Vital signs reviewed, Patient's Cardiovascular Status Stable, Respiratory Function Stable, Patent Airway and No signs of Nausea or vomiting  Post-op Vital Signs: Reviewed and stable  Complications: No apparent anesthesia complications

## 2015-09-17 NOTE — Anesthesia Preprocedure Evaluation (Signed)
Anesthesia Evaluation  Patient identified by MRN, date of birth, ID band Patient awake    Reviewed: Allergy & Precautions, NPO status , Patient's Chart, lab work & pertinent test results, reviewed documented beta blocker date and time   Airway Mallampati: I  TM Distance: >3 FB Neck ROM: Full    Dental no notable dental hx.    Pulmonary Current Smoker,    Pulmonary exam normal        Cardiovascular hypertension, Normal cardiovascular exam     Neuro/Psych negative neurological ROS  negative psych ROS   GI/Hepatic Neg liver ROS, GERD  ,  Endo/Other  diabetes, Poorly Controlled, Type 2  Renal/GU negative Renal ROS  negative genitourinary   Musculoskeletal negative musculoskeletal ROS (+)   Abdominal   Peds  Hematology negative hematology ROS (+)   Anesthesia Other Findings   Reproductive/Obstetrics negative OB ROS                             Anesthesia Physical Anesthesia Plan  ASA: II  Anesthesia Plan: MAC   Post-op Pain Management:    Induction: Intravenous  Airway Management Planned:   Additional Equipment:   Intra-op Plan:   Post-operative Plan:   Informed Consent: I have reviewed the patients History and Physical, chart, labs and discussed the procedure including the risks, benefits and alternatives for the proposed anesthesia with the patient or authorized representative who has indicated his/her understanding and acceptance.     Plan Discussed with: CRNA  Anesthesia Plan Comments:         Anesthesia Quick Evaluation

## 2015-09-17 NOTE — Anesthesia Postprocedure Evaluation (Signed)
  Anesthesia Post-op Note  Patient: Henry Fuller  Procedure(s) Performed: Procedure(s) with comments: COLONOSCOPY WITH PROPOFOL (N/A) - DIABETIC ESOPHAGOGASTRODUODENOSCOPY (EGD) WITH PROPOFOL (N/A)  Anesthesia type:MAC  Patient location: PACU  Post pain: Pain level controlled  Post assessment: Post-op Vital signs reviewed, Patient's Cardiovascular Status Stable, Respiratory Function Stable, Patent Airway and No signs of Nausea or vomiting  Post vital signs: Reviewed and stable  Last Vitals:  Filed Vitals:   09/17/15 0838  BP: 102/74  Pulse: 88  Temp:   Resp: 18    Level of consciousness: awake, alert  and patient cooperative  Complications: No apparent anesthesia complications

## 2015-09-17 NOTE — Op Note (Signed)
[facility] Gastroenterology Patient Name: Henry Fuller Procedure Date: 09/17/2015 8:14 AM MRN: [id number] Account #: [id number] Date of Birth: [birth year] Admit Type: Outpatient Age: 39 Room: [facility] OR ROOM 01 Gender: Male Note Status: Finalized Procedure:         Colonoscopy Indications:       Weight loss Providers:         [name], MD Referring MD:      Guadalupe Maple, MD (Referring MD) Medicines:         Propofol per Anesthesia Complications:     No immediate complications. Procedure:         Pre-Anesthesia Assessment:                    - Prior to the procedure, a History and Physical was                     performed, and patient medications and allergies were                     reviewed. The patient's tolerance of previous anesthesia                     was also reviewed. The risks and benefits of the procedure                     and the sedation options and risks were discussed with the                     patient. All questions were answered, and informed consent                     was obtained. Prior Anticoagulants: The patient has taken                     no previous anticoagulant or antiplatelet agents. ASA                     Grade Assessment: II - A patient with mild systemic                     disease. After reviewing the risks and benefits, the                     patient was deemed in satisfactory condition to undergo                     the procedure.                    After obtaining informed consent, the colonoscope was                     passed under direct vision. Throughout the procedure, the                     patient's blood pressure, pulse, and oxygen saturations                     were monitored continuously. The Olympus CF H180AL                     colonoscope (S#: [id number]) was introduced through the anus  and advanced to the the terminal ileum. The colonoscopy                     was performed without  difficulty. The patient tolerated                     the procedure well. The quality of the bowel preparation                     was excellent. Findings:      The perianal and digital rectal examinations were normal.      Non-bleeding internal hemorrhoids were found during retroflexion. The       hemorrhoids were Grade I (internal hemorrhoids that do not prolapse).      The terminal ileum appeared normal. Biopsies were taken with a cold       forceps for histology. Impression:        - Non-bleeding internal hemorrhoids.                    - The examined portion of the ileum was normal. Biopsied. Recommendation:    - Await pathology results. Procedure Code(s): --- Professional ---                    [id number], Colonoscopy, flexible; with biopsy, single or                     multiple Diagnosis Code(s): --- Professional ---                    R63.4, Abnormal weight loss                    K64.0, First degree hemorrhoids CPT copyright 2014 American Medical Association. All rights reserved. The codes documented in this report are preliminary and upon coder review may  be revised to meet current compliance requirements. [name], MD 09/17/2015 8:31:30 AM This report has been signed electronically. Number of Addenda: 0 Note Initiated On: 09/17/2015 8:14 AM Scope Withdrawal Time: 0 hours 8 minutes 36 seconds  Total Procedure Duration: 0 hours 12 minutes 29 seconds       [facility]

## 2015-09-17 NOTE — Op Note (Signed)
[facility] Gastroenterology Patient Name: Henry Fuller Procedure Date: 09/17/2015 8:01 AM MRN: [id number] Account #: [id number] Date of Birth: April 27, 1976 Admit Type: Outpatient Age: 39 Room: [facility] OR ROOM 01 Gender: Male Note Status: Finalized Procedure:         Upper GI endoscopy Indications:       Previously treated for Helicobacter pylori, Weight loss Providers:         [name], MD Referring MD:      Guadalupe Maple, MD (Referring MD) Medicines:         Propofol per Anesthesia Complications:     No immediate complications. Procedure:         Pre-Anesthesia Assessment:                    - Prior to the procedure, a History and Physical was                     performed, and patient medications and allergies were                     reviewed. The patient's tolerance of previous anesthesia                     was also reviewed. The risks and benefits of the procedure                     and the sedation options and risks were discussed with the                     patient. All questions were answered, and informed consent                     was obtained. Prior Anticoagulants: The patient has taken                     no previous anticoagulant or antiplatelet agents. ASA                     Grade Assessment: II - A patient with mild systemic                     disease. After reviewing the risks and benefits, the                     patient was deemed in satisfactory condition to undergo                     the procedure.                    After obtaining informed consent, the endoscope was passed                     under direct vision. Throughout the procedure, the                     patient's blood pressure, pulse, and oxygen saturations                     were monitored continuously. The was introduced through                     the mouth, and advanced to the second part of duodenum.  The upper GI endoscopy was accomplished  without                     difficulty. The patient tolerated the procedure well. Findings:      The examined esophagus was normal.      Localized mild inflammation characterized by erythema was found in the       gastric antrum. Biopsies were taken with a cold forceps for histology.      A moderate round lesion with a deep center deformity was found in the       first part of the duodenum. Biopsies were taken with a cold forceps for       histology. Impression:        - Normal esophagus.                    - Gastritis. Biopsied.                    - Duodenal deformity. Biopsied. Recommendation:    - Await pathology results.                    - Perform a colonoscopy today. Procedure Code(s): --- Professional ---                    [phone], Esophagogastroduodenoscopy, flexible, transoral;                     with biopsy, single or multiple Diagnosis Code(s): --- Professional ---                    R63.4, Abnormal weight loss                    K29.70, Gastritis, unspecified, without bleeding                    K31.89, Other diseases of stomach and duodenum CPT copyright 2014 American Medical Association. All rights reserved. The codes documented in this report are preliminary and upon coder review may  be revised to meet current compliance requirements. [name], MD 09/17/2015 8:14:26 AM This report has been signed electronically. Number of Addenda: 0 Note Initiated On: 09/17/2015 8:01 AM Total Procedure Duration: 0 hours 5 minutes 2 seconds       [facility]

## 2015-09-18 ENCOUNTER — Encounter: Payer: Self-pay | Admitting: Gastroenterology

## 2015-09-22 ENCOUNTER — Telehealth: Payer: Self-pay

## 2015-09-22 ENCOUNTER — Other Ambulatory Visit: Payer: Self-pay

## 2015-09-22 NOTE — Telephone Encounter (Signed)
-----   Message from [name], MD sent at 09/19/2015  4:58 PM EDT ----- Let the patient know the lesion in the small bowel was not cancer or precancer. He should have it looked at again in 1 month to see if it has grown or gone away. The rest of the biopsies were normal. Have him come in to discuss what we need to do next for his weight loss.

## 2015-09-22 NOTE — Telephone Encounter (Signed)
Pt has been notified of procedure results. Pt has been scheduled for a repeat EGD on 10/15/15. I have mailed pt instructions on prep. Pt stated he had not started the Omeprazole 40mg  BID yet. Advised pt to go ahead and pick this up from the pharmacy today and start it. Also, advised to continue his Carafate prescribed by Dr. Allen Norris. Pt will keep appt that has been scheduled with Dr. Sanda Klein.

## 2015-09-24 ENCOUNTER — Ambulatory Visit: Payer: BLUE CROSS/BLUE SHIELD | Admitting: Gastroenterology

## 2015-09-28 ENCOUNTER — Encounter: Payer: Self-pay | Admitting: Family Medicine

## 2015-09-28 ENCOUNTER — Ambulatory Visit (INDEPENDENT_AMBULATORY_CARE_PROVIDER_SITE_OTHER): Payer: BLUE CROSS/BLUE SHIELD | Admitting: Family Medicine

## 2015-09-28 VITALS — BP 121/83 | HR 93 | Temp 97.7°F | Wt 167.0 lb

## 2015-09-28 DIAGNOSIS — E1165 Type 2 diabetes mellitus with hyperglycemia: Secondary | ICD-10-CM | POA: Diagnosis not present

## 2015-09-28 DIAGNOSIS — G8929 Other chronic pain: Secondary | ICD-10-CM | POA: Diagnosis not present

## 2015-09-28 DIAGNOSIS — R1013 Epigastric pain: Secondary | ICD-10-CM

## 2015-09-28 DIAGNOSIS — K921 Melena: Secondary | ICD-10-CM

## 2015-09-28 DIAGNOSIS — Z794 Long term (current) use of insulin: Secondary | ICD-10-CM

## 2015-09-28 DIAGNOSIS — K2970 Gastritis, unspecified, without bleeding: Secondary | ICD-10-CM

## 2015-09-28 DIAGNOSIS — Z720 Tobacco use: Secondary | ICD-10-CM | POA: Diagnosis not present

## 2015-09-28 DIAGNOSIS — K3189 Other diseases of stomach and duodenum: Secondary | ICD-10-CM

## 2015-09-28 MED ORDER — OMEPRAZOLE 40 MG PO CPDR: 40.0000 mg | DELAYED_RELEASE_CAPSULE | Freq: Every day | ORAL | Status: DC

## 2015-09-28 NOTE — Patient Instructions (Addendum)
Please do call [facility] surgical today and let him know about your dark stools, because he may want to move up your procedure Consider a gummy multivitamin Do see the endocrinologist as soon as possible Give an update at the end of the week about the procedure and when that will be Call me with any news GINA--> extend work excuse two more weeks, tentative return on Monday November 28th

## 2015-09-28 NOTE — Progress Notes (Signed)
BP 121/83 mmHg  Pulse 93  Temp(Src) 97.7 F (36.5 C)  Wt 167 lb (75.751 kg)  SpO2 99%   Subjective:    Patient ID: Henry Fuller, male    DOB: [birth year], 39 y.o.   MRN: [id number]  HPI: Henry Fuller is a 39 y.o. male  Chief Complaint  Patient presents with  . Abdominal Pain    still having stomach pain. He said they found something and he has to repeat EGD.   He is here with his wife; he saw Dr. Pat Patrick and they found something on the EGD and there was an abnormality; they did a biopsy and it was noncancerous; however, they want to do the procedure again and see if it's growing; it so, they need to remove it; they will do that December 1st; he is going to see him too about the weight loss The only thing that feels good eating is a popsicle; he can still smell certain smells and it makes him feel nauseated He had dark stool the other day; not going to the bathroom regularly; his stools were black black on Friday Still not eating well; feels full early  His diabetes is doing better; got down into the 200s, the lowest it's been in a while  He hasn't smoked at all today; his BP is controlled today and he thinks it might be from not smoking  Energy level has been fair, just laying around, coach potato during all this; still having abdominal pain, early satiety, nausea; feels better when curled up in a ball  Relevant past medical, surgical, family and social history reviewed and updated as indicated. Interim medical history since our last visit reviewed. Allergies and medications reviewed and updated.  Review of Systems Per HPI unless specifically indicated above     Objective:    BP 121/83 mmHg  Pulse 93  Temp(Src) 97.7 F (36.5 C)  Wt 167 lb (75.751 kg)  SpO2 99%  Wt Readings from Last 3 Encounters:  09/28/15 167 lb (75.751 kg)  09/17/15 166 lb (75.297 kg)  09/15/15 169 lb 6.4 oz (76.839 kg)    Physical Exam  Constitutional: He appears well-developed and  well-nourished.  Non-toxic appearance. He does not have a sickly appearance.  HENT:  Mouth/Throat: Mucous membranes are normal.  Cardiovascular: Normal rate and regular rhythm.   Pulmonary/Chest: Effort normal and breath sounds normal.  Abdominal: Soft. Bowel sounds are normal. He exhibits no distension. There is no hepatosplenomegaly. There is tenderness in the epigastric area. There is no rebound and no guarding.  Neurological: He displays no tremor. No sensory deficit.  Skin: Skin is warm and dry. He is not diaphoretic. No pallor.  Psychiatric:  Fair eye contact; cooperative; appears slightly anxious   Results for orders placed or performed during the hospital encounter of 09/17/15  Glucose, capillary  Result Value Ref Range   Glucose-Capillary 262 (H) 65 - 99 mg/dL  Glucose, capillary  Result Value Ref Range   Glucose-Capillary 282 (H) 65 - 99 mg/dL      Assessment & Plan:   Problem List Items Addressed This Visit      Digestive   Gastritis    Noted on EGD; patient on carafate, PPI; encouragement given to quit smoking        Endocrine   Type 2 diabetes mellitus, uncontrolled ([place])    He really needs to see the endocrinologist; referral was entered October 14th; will have staff check on status  Other   Abdominal pain, chronic, epigastric - Primary    Recent EGD with abnormality; concerning for malignancy, though initial biopsy was negative; she brought copy of EGD with picture of visible abnormality (mass); patient instructed to keep close f/u with GI doctor      Blood in the stool    With known abnormality on EGD discovered by gastroenterologist; I told the patient and his wife to notify the GI doctor today to let him know about the black stools he had in case they decide to move the repeat EGD and biopsy up from December 1st; the GI doctor needs to know this; they agree to call      Tobacco abuse    Encouraged patient to quit; so glad he has managed to not  smoke today; we discussed his early atherosclerosis at previous visit with wife present      Duodenal mass    Noted on EGD 09/17/15; patient to have repeat EGD in early December; however, with recent black stools, I urged patient and his wife to contact GI to see if procedure can or should be moved up         Follow up plan: Return if symptoms worsen or fail to improve.  An after-visit summary was printed and given to the patient at [place].  Please see the patient instructions which may contain other information and recommendations beyond what is mentioned above in the assessment and plan.  Reviewed the EGD report brought by patient which included pictures, and I also reviewed the EGD in Epic which showed gastritis and duodenal body lesion with central ulceration

## 2015-10-03 DIAGNOSIS — K3189 Other diseases of stomach and duodenum: Secondary | ICD-10-CM | POA: Insufficient documentation

## 2015-10-03 NOTE — Assessment & Plan Note (Signed)
Encouraged patient to quit; so glad he has managed to not smoke today; we discussed his early atherosclerosis at previous visit with wife present

## 2015-10-03 NOTE — Assessment & Plan Note (Signed)
Recent EGD with abnormality; concerning for malignancy, though initial biopsy was negative; she brought copy of EGD with picture of visible abnormality (mass); patient instructed to keep close f/u with GI doctor

## 2015-10-03 NOTE — Assessment & Plan Note (Signed)
With known abnormality on EGD discovered by gastroenterologist; I told the patient and his wife to notify the GI doctor today to let him know about the black stools he had in case they decide to move the repeat EGD and biopsy up from December 1st; the GI doctor needs to know this; they agree to call

## 2015-10-03 NOTE — Assessment & Plan Note (Addendum)
Noted on EGD 09/17/15; patient to have repeat EGD in early December; however, with recent black stools, I urged patient and his wife to contact GI to see if procedure can or should be moved up

## 2015-10-03 NOTE — Assessment & Plan Note (Signed)
He really needs to see the endocrinologist; referral was entered October 14th; will have staff check on status

## 2015-10-03 NOTE — Assessment & Plan Note (Signed)
Noted on EGD; patient on carafate, PPI; encouragement given to quit smoking

## 2015-10-05 ENCOUNTER — Encounter
Admission: RE | Disposition: A | Discharge status: Home / Self Care | Payer: Self-pay | Source: Ambulatory Visit | Attending: Gastroenterology

## 2015-10-05 ENCOUNTER — Other Ambulatory Visit: Payer: Self-pay | Admitting: Gastroenterology

## 2015-10-05 ENCOUNTER — Ambulatory Visit
Admission: RE | Admit: 2015-10-05 | Discharge: 2015-10-05 | Disposition: A | Discharge status: Home / Self Care | Payer: BLUE CROSS/BLUE SHIELD | Source: Ambulatory Visit | Attending: Gastroenterology | Admitting: Gastroenterology

## 2015-10-05 ENCOUNTER — Encounter: Payer: Self-pay | Admitting: *Deleted

## 2015-10-05 ENCOUNTER — Ambulatory Visit: Payer: BLUE CROSS/BLUE SHIELD | Admitting: Anesthesiology

## 2015-10-05 DIAGNOSIS — Z823 Family history of stroke: Secondary | ICD-10-CM | POA: Diagnosis not present

## 2015-10-05 DIAGNOSIS — Z8249 Family history of ischemic heart disease and other diseases of the circulatory system: Secondary | ICD-10-CM | POA: Diagnosis not present

## 2015-10-05 DIAGNOSIS — K317 Polyp of stomach and duodenum: Secondary | ICD-10-CM | POA: Insufficient documentation

## 2015-10-05 DIAGNOSIS — K2980 Duodenitis without bleeding: Secondary | ICD-10-CM | POA: Diagnosis not present

## 2015-10-05 DIAGNOSIS — Z800 Family history of malignant neoplasm of digestive organs: Secondary | ICD-10-CM | POA: Diagnosis not present

## 2015-10-05 DIAGNOSIS — Z79899 Other long term (current) drug therapy: Secondary | ICD-10-CM | POA: Insufficient documentation

## 2015-10-05 DIAGNOSIS — E559 Vitamin D deficiency, unspecified: Secondary | ICD-10-CM | POA: Insufficient documentation

## 2015-10-05 DIAGNOSIS — I10 Essential (primary) hypertension: Secondary | ICD-10-CM | POA: Insufficient documentation

## 2015-10-05 DIAGNOSIS — Z833 Family history of diabetes mellitus: Secondary | ICD-10-CM | POA: Diagnosis not present

## 2015-10-05 DIAGNOSIS — K3189 Other diseases of stomach and duodenum: Secondary | ICD-10-CM

## 2015-10-05 DIAGNOSIS — Z8379 Family history of other diseases of the digestive system: Secondary | ICD-10-CM | POA: Diagnosis not present

## 2015-10-05 DIAGNOSIS — E119 Type 2 diabetes mellitus without complications: Secondary | ICD-10-CM | POA: Insufficient documentation

## 2015-10-05 DIAGNOSIS — F17210 Nicotine dependence, cigarettes, uncomplicated: Secondary | ICD-10-CM | POA: Diagnosis not present

## 2015-10-05 DIAGNOSIS — Z888 Allergy status to other drugs, medicaments and biological substances status: Secondary | ICD-10-CM | POA: Insufficient documentation

## 2015-10-05 DIAGNOSIS — Z794 Long term (current) use of insulin: Secondary | ICD-10-CM | POA: Insufficient documentation

## 2015-10-05 DIAGNOSIS — K219 Gastro-esophageal reflux disease without esophagitis: Secondary | ICD-10-CM | POA: Insufficient documentation

## 2015-10-05 DIAGNOSIS — E785 Hyperlipidemia, unspecified: Secondary | ICD-10-CM | POA: Diagnosis not present

## 2015-10-05 DIAGNOSIS — E291 Testicular hypofunction: Secondary | ICD-10-CM | POA: Insufficient documentation

## 2015-10-05 DIAGNOSIS — K6389 Other specified diseases of intestine: Secondary | ICD-10-CM | POA: Diagnosis not present

## 2015-10-05 HISTORY — PX: ESOPHAGOGASTRODUODENOSCOPY (EGD) WITH PROPOFOL: SHX5813

## 2015-10-05 LAB — GLUCOSE, CAPILLARY
Glucose-Capillary: 296 mg/dL — ABNORMAL HIGH (ref 65–99)
Glucose-Capillary: 257 mg/dL — ABNORMAL HIGH (ref 65–99)

## 2015-10-05 SURGERY — ESOPHAGOGASTRODUODENOSCOPY (EGD) WITH PROPOFOL
Anesthesia: Monitor Anesthesia Care | Wound class: Clean Contaminated

## 2015-10-05 MED ORDER — GLYCOPYRROLATE 0.2 MG/ML IJ SOLN
INTRAMUSCULAR | Status: DC | PRN
  Administered 2015-10-05: 0.2 mg via INTRAVENOUS

## 2015-10-05 MED ORDER — ACETAMINOPHEN 160 MG/5ML PO SOLN: 325.0000 mg | ORAL | Status: DC | PRN

## 2015-10-05 MED ORDER — PROPOFOL 10 MG/ML IV BOLUS
INTRAVENOUS | Status: DC | PRN
  Administered 2015-10-05: 100 mg via INTRAVENOUS
  Administered 2015-10-05 (×2): 50 mg via INTRAVENOUS

## 2015-10-05 MED ORDER — ACETAMINOPHEN 325 MG PO TABS
325.0000 mg | ORAL_TABLET | ORAL | Status: DC | PRN
Start: 2015-10-05 — End: 2015-10-05

## 2015-10-05 MED ORDER — LIDOCAINE HCL (CARDIAC) 20 MG/ML IV SOLN
INTRAVENOUS | Status: DC | PRN
  Administered 2015-10-05: 20 mg via INTRAVENOUS

## 2015-10-05 MED ORDER — LACTATED RINGERS IV SOLN
INTRAVENOUS | Status: DC
  Administered 2015-10-05 (×2): via INTRAVENOUS

## 2015-10-05 MED ORDER — STERILE WATER FOR IRRIGATION IR SOLN
Status: DC | PRN
  Administered 2015-10-05: 50 mL

## 2015-10-05 SURGICAL SUPPLY — 39 items
BALLN DILATOR 10-12 8 (BALLOONS)
BALLN DILATOR 12-15 8 (BALLOONS)
BALLN DILATOR 15-18 8 (BALLOONS)
BALLN DILATOR CRE 0-12 8 (BALLOONS)
BALLN DILATOR ESOPH 8 10 CRE (MISCELLANEOUS) IMPLANT
BALLOON DILATOR 12-15 8 (BALLOONS) IMPLANT
BALLOON DILATOR 15-18 8 (BALLOONS) IMPLANT
BALLOON DILATOR CRE 0-12 8 (BALLOONS) IMPLANT
BLOCK BITE 60FR ADLT L/F GRN (MISCELLANEOUS) ×2 IMPLANT
CANISTER SUCT 1200ML W/VALVE (MISCELLANEOUS) ×2 IMPLANT
FCP ESCP3.2XJMB 240X2.8X (MISCELLANEOUS)
FORCEPS BIOP RAD 4 LRG CAP 4 (CUTTING FORCEPS) ×2 IMPLANT
FORCEPS BIOP RJ4 240 W/NDL (MISCELLANEOUS)
FORCEPS ESCP3.2XJMB 240X2.8X (MISCELLANEOUS) IMPLANT
GOWN CVR UNV OPN BCK APRN NK (MISCELLANEOUS) ×2 IMPLANT
GOWN ISOL THUMB LOOP REG UNIV (MISCELLANEOUS) ×2
HEMOCLIP INSTINCT (CLIP) IMPLANT
INJECTOR VARIJECT VIN23 (MISCELLANEOUS) IMPLANT
KIT CO2 TUBING (TUBING) IMPLANT
KIT DEFENDO VALVE AND CONN (KITS) IMPLANT
KIT ENDO PROCEDURE OLY (KITS) ×2 IMPLANT
LIGATOR MULTIBAND 6SHOOTER MBL (MISCELLANEOUS) IMPLANT
MARKER SPOT ENDO TATTOO 5ML (MISCELLANEOUS) IMPLANT
PAD GROUND ADULT SPLIT (MISCELLANEOUS) IMPLANT
SNARE SHORT THROW 13M SML OVAL (MISCELLANEOUS) IMPLANT
SNARE SHORT THROW 30M LRG OVAL (MISCELLANEOUS) IMPLANT
SPOT EX ENDOSCOPIC TATTOO (MISCELLANEOUS)
SUCTION POLY TRAP 4CHAMBER (MISCELLANEOUS) IMPLANT
SYR INFLATION 60ML (SYRINGE) IMPLANT
TRAP SUCTION POLY (MISCELLANEOUS) IMPLANT
TUBING CONN 6MMX3.1M (TUBING)
TUBING SUCTION CONN 0.25 STRL (TUBING) IMPLANT
UNDERPAD 30X60 958B10 (PK) (MISCELLANEOUS) IMPLANT
VALVE BIOPSY ENDO (VALVE) IMPLANT
VARIJECT INJECTOR VIN23 (MISCELLANEOUS)
WATER AUXILLARY (MISCELLANEOUS) IMPLANT
WATER STERILE IRR 250ML POUR (IV SOLUTION) ×2 IMPLANT
WATER STERILE IRR 500ML POUR (IV SOLUTION) IMPLANT
WIRE CRE 18-20MM 8CM F G (MISCELLANEOUS) IMPLANT

## 2015-10-05 NOTE — Anesthesia Preprocedure Evaluation (Signed)
Anesthesia Evaluation  Patient identified by MRN, date of birth, ID band  Reviewed: Allergy & Precautions, H&P , NPO status , Patient's Chart, lab work & pertinent test results  Airway Mallampati: II  TM Distance: >3 FB Neck ROM: full    Dental no notable dental hx.    Pulmonary Current Smoker,    Pulmonary exam normal        Cardiovascular hypertension,  Rhythm:regular Rate:Normal     Neuro/Psych    GI/Hepatic GERD  ,  Endo/Other  diabetes  Renal/GU      Musculoskeletal   Abdominal   Peds  Hematology   Anesthesia Other Findings   Reproductive/Obstetrics                             Anesthesia Physical Anesthesia Plan  ASA: II  Anesthesia Plan: MAC   Post-op Pain Management:    Induction:   Airway Management Planned:   Additional Equipment:   Intra-op Plan:   Post-operative Plan:   Informed Consent: I have reviewed the patients History and Physical, chart, labs and discussed the procedure including the risks, benefits and alternatives for the proposed anesthesia with the patient or authorized representative who has indicated his/her understanding and acceptance.     Plan Discussed with: CRNA  Anesthesia Plan Comments:         Anesthesia Quick Evaluation

## 2015-10-05 NOTE — Transfer of Care (Signed)
Immediate Anesthesia Transfer of Care Note  Patient: Henry Fuller  Procedure(s) Performed: Procedure(s): ESOPHAGOGASTRODUODENOSCOPY (EGD) WITH PROPOFOL (N/A)  Patient Location: PACU  Anesthesia Type: MAC  Level of Consciousness: awake, alert  and patient cooperative  Airway and Oxygen Therapy: Patient Spontanous Breathing and Patient connected to supplemental oxygen  Post-op Assessment: Post-op Vital signs reviewed, Patient's Cardiovascular Status Stable, Respiratory Function Stable, Patent Airway and No signs of Nausea or vomiting  Post-op Vital Signs: Reviewed and stable  Complications: No apparent anesthesia complications

## 2015-10-05 NOTE — Anesthesia Procedure Notes (Signed)
Procedure Name: MAC Performed by: Azalia Neuberger Pre-anesthesia Checklist: Patient identified, Emergency Drugs available, Suction available, Patient being monitored and Timeout performed Patient Re-evaluated:Patient Re-evaluated prior to inductionOxygen Delivery Method: Nasal cannula       

## 2015-10-05 NOTE — Anesthesia Postprocedure Evaluation (Signed)
Anesthesia Post Note  Patient: Henry Fuller  Procedure(s) Performed: Procedure(s) (LRB): ESOPHAGOGASTRODUODENOSCOPY (EGD) WITH PROPOFOL (N/A)  Patient location during evaluation: PACU Anesthesia Type: MAC Level of consciousness: awake and alert and oriented Pain management: satisfactory to patient Vital Signs Assessment: post-procedure vital signs reviewed and stable Respiratory status: spontaneous breathing, nonlabored ventilation and respiratory function stable Cardiovascular status: blood pressure returned to baseline and stable Postop Assessment: Adequate PO intake and No signs of nausea or vomiting Anesthetic complications: no    Last Vitals:  Filed Vitals:   10/05/15 0915 10/05/15 0925  BP:    Pulse:  86  Temp: 36.6 C   Resp:  15    Last Pain: There were no vitals filed for this visit.               Raliegh Ip

## 2015-10-05 NOTE — Op Note (Signed)
[facility] Gastroenterology Patient Name: Henry Fuller Procedure Date: 10/05/2015 8:52 AM MRN: [id number] Account #: [id number] Date of Birth: 1975/12/25 Admit Type: Outpatient Age: 39 Room: [facility] OR ROOM 01 Gender: Male Note Status: Finalized Procedure:         Upper GI endoscopy Indications:       Follow-up of polyps in the duodenum Providers:         [name], MD Referring MD:      Guadalupe Maple, MD (Referring MD) Medicines:         Propofol per Anesthesia Complications:     No immediate complications. Procedure:         Pre-Anesthesia Assessment:                    - Prior to the procedure, a History and Physical was                     performed, and patient medications and allergies were                     reviewed. The patient's tolerance of previous anesthesia                     was also reviewed. The risks and benefits of the procedure                     and the sedation options and risks were discussed with the                     patient. All questions were answered, and informed consent                     was obtained. Prior Anticoagulants: The patient has taken                     no previous anticoagulant or antiplatelet agents. ASA                     Grade Assessment: II - A patient with mild systemic                     disease. After reviewing the risks and benefits, the                     patient was deemed in satisfactory condition to undergo                     the procedure.                    After obtaining informed consent, the endoscope was passed                     under direct vision. Throughout the procedure, the                     patient's blood pressure, pulse, and oxygen saturations                     were monitored continuously. The Olympus GIF H180J                     colonscope [id number]) was introduced through the mouth,  and advanced to the second part of duodenum. The upper GI             endoscopy was accomplished without difficulty. The patient                     tolerated the procedure well. Findings:      The examined esophagus was normal.      The stomach was normal.      A moderate sized lesion, unchanged from previous EGD, deformity was       found in the first part of the duodenum. Biopsies were taken with a cold       forceps for histology. Impression:        - Normal esophagus.                    - Normal stomach.                    - Duodenal deformity. Biopsied. Recommendation:    - Await pathology results.                    - Consider EUS if biopsies not diagnostic. Procedure Code(s): --- Professional ---                    [phone], Esophagogastroduodenoscopy, flexible, transoral;                     with biopsy, single or multiple Diagnosis Code(s): --- Professional ---                    K31.7, Polyp of stomach and duodenum                    K31.89, Other diseases of stomach and duodenum CPT copyright 2014 American Medical Association. All rights reserved. The codes documented in this report are preliminary and upon coder review may  be revised to meet current compliance requirements. [name], MD 10/05/2015 9:10:55 AM This report has been signed electronically. Number of Addenda: 0 Note Initiated On: 10/05/2015 8:52 AM      [facility]

## 2015-10-05 NOTE — H&P (Signed)
[facility]  [address] [place], [place] [address] Phone: [phone] Fax : [phone]  Primary Care Physician:  Golden Pop, MD Primary Gastroenterologist:  Dr. Allen Norris  Pre-Procedure History & Physical: HPI:  Henry Fuller is a 40 y.o. male is here for an endoscopy.   Past Medical History  Diagnosis Date  . Hyperlipidemia   . Hypogonadism in male   . Vitamin D deficiency   . Erectile dysfunction   . Hypertension     controlled on meds  . Diabetes mellitus without complication ([place])     type 2  . Loose tooth due to trauma     front  . GERD (gastroesophageal reflux disease)     Past Surgical History  Procedure Laterality Date  . Vasectomy  2001  . Hernia repair  [id number]    umbilical  . Colonoscopy with propofol N/A 09/17/2015    Procedure: COLONOSCOPY WITH PROPOFOL;  Surgeon: [name], MD;  Location: [facility];  Service: Endoscopy;  Laterality: N/A;  DIABETIC  . Esophagogastroduodenoscopy (egd) with propofol N/A 09/17/2015    Procedure: ESOPHAGOGASTRODUODENOSCOPY (EGD) WITH PROPOFOL;  Surgeon: [name], MD;  Location: [facility];  Service: Endoscopy;  Laterality: N/A;    Prior to Admission medications   Medication Sig Start Date End Date Taking? Authorizing Provider  benazepril-hydrochlorthiazide (LOTENSIN HCT) 20-12.5 MG tablet Take 1 tablet by mouth daily. New strength 08/24/15  Yes Arnetha Courser, MD  insulin NPH-regular Human (NOVOLIN 70/30) (70-30) 100 UNIT/ML injection Inject 12 Units into the skin 2 (two) times daily with a meal.   Yes Historical Provider, MD  omeprazole (PRILOSEC) 40 MG capsule Take 1 capsule (40 mg total) by mouth daily. One by mouth daily 09/28/15  Yes Arnetha Courser, MD  sucralfate (CARAFATE) 1 GM/10ML suspension Take 10 mLs (1 g total) by mouth 4 (four) times daily -  with meals and at bedtime. 09/04/15  Yes Arnetha Courser, MD  fluticasone (FLONASE) 50 MCG/ACT nasal spray Place 2 sprays  into both nostrils as needed.     Historical Provider, MD  ondansetron (ZOFRAN ODT) 4 MG disintegrating tablet Take 1 tablet (4 mg total) by mouth every 8 (eight) hours as needed for nausea or vomiting. Patient not taking: Reported on 09/15/2015 08/29/15   Loney Hering, MD    Allergies as of 09/22/2015 - Review Complete 09/17/2015  Allergen Reaction Noted  . Atenolol Other (See Comments) 12/31/2014    Family History  Problem Relation Age of Onset  . Diabetes Mother   . Hypertension Mother   . Cancer Father     colon cancer  . Diabetes Maternal Grandmother   . Hypertension Maternal Grandmother   . Liver disease Maternal Grandfather   . Cancer Paternal Grandmother   . Stroke Paternal Grandfather     Social History   Social History  . Marital Status: Married    Spouse Name: N/A  . Number of Children: N/A  . Years of Education: N/A   Occupational History  . Not on file.   Social History Main Topics  . Smoking status: Current Every Day Smoker -- 0.50 packs/day for 9 years    Types: Cigarettes  . Smokeless tobacco: Never Used  . Alcohol Use: Yes     Comment: socially   . Drug Use: No  . Sexual Activity: Yes   Other Topics Concern  . Not on file   Social History Narrative    Review of Systems: See HPI,  otherwise negative ROS  Physical Exam: Ht 5\' 9"  (1.753 m)  Wt 164 lb (74.39 kg)  BMI 24.21 kg/m2 General:   Alert,  pleasant and cooperative in NAD Head:  Normocephalic and atraumatic. Neck:  Supple; no masses or thyromegaly. Lungs:  Clear throughout to auscultation.    Heart:  Regular rate and rhythm. Abdomen:  Soft, nontender and nondistended. Normal bowel sounds, without guarding, and without rebound.   Neurologic:  Alert and  oriented x4;  grossly normal neurologically.  Impression/Plan: Henry Fuller is here for an endoscopy to be performed for duodenal polyp  Risks, benefits, limitations, and alternatives regarding  endoscopy have been  reviewed with the patient.  Questions have been answered.  All parties agreeable.   Ollen Bowl, MD  10/05/2015, 8:32 AM

## 2015-10-06 ENCOUNTER — Encounter: Payer: Self-pay | Admitting: Gastroenterology

## 2015-10-07 ENCOUNTER — Telehealth: Payer: Self-pay

## 2015-10-07 DIAGNOSIS — D132 Benign neoplasm of duodenum: Secondary | ICD-10-CM

## 2015-10-07 NOTE — Telephone Encounter (Signed)
-----   Message from [name], MD sent at 10/07/2015  8:04 AM EST ----- Let the patient know that the biopsies again came back with only inflammation. He should be set up for an endoscopic ultrasound of this lesion. He should also be set up for a small bowel follow-through.

## 2015-10-07 NOTE — Telephone Encounter (Signed)
Pt notified of results

## 2015-10-12 ENCOUNTER — Telehealth: Payer: Self-pay | Admitting: Family Medicine

## 2015-10-12 ENCOUNTER — Telehealth: Payer: Self-pay

## 2015-10-12 NOTE — Telephone Encounter (Signed)
Pt would like to know if he could get a note taking him out until he gets finished taking all the tests that need to be taken.

## 2015-10-12 NOTE — Telephone Encounter (Signed)
Routing to provider  

## 2015-10-12 NOTE — Telephone Encounter (Signed)
  Oncology Nurse Navigator Documentation  Referral date to RadOnc/MedOnc: 10/07/15 (10/12/15 1200) Navigator Encounter Type: Telephone;Introductory phone call (10/12/15 1200)         Interventions: Coordination of Care (10/12/15 1200)   Coordination of Care: EUS (10/12/15 1200)        Time Spent with Patient: 30 (10/12/15 1200)   Spoke with Henry Fuller on the phone. EUS has been scheduled for 10-15-15 with Dr Mont Dutton at [facility]. Went over instructions and copy sent to his email. Provided my contact information for any further questions or concerns.   INSTRUCTIONS FOR ENDOSCOPIC ULTRASOUND  -Your procedure has been scheduled for December 1st with Dr Mont Dutton at [facility] will contact you to pre-register over the phone. If for any reason you have not received a call within one week prior to your scheduled procedure date, please call [phone]. -To get your scheduled arrival time, please call the Endoscopy unit at  [phone] between 1-3pm on: November 30th    -ON THE DAY OF YOU PROCEDURE:  1. If you are scheduled for a morning procedure, nothing to drink after midnight  -If you are scheduled for an afternoon procedure, you may have clear liquids until 5 hours prior  to the procedure but no carbonated drinks or broth  2. NO FOOD THE DAY OF YOUR PROCEDURE  3. You may take your heart, seizure, blood pressure, Parkinson's or breathing medications at  6am with just enough water to get your pills down  4. Do not take any oral Diabetic medications the morning of your procedure.  5. Do not take Vitamins  -On the day of your procedure, come to the [facility] Admitting/Registration desk (First desk on the right) at the scheduled arrival time. You MUST have someone drive you home from your procedure. You must have a responsible adult with a valid drivers license who is on site throughout your entire procedure and who can stay with you for several hours after your procedure.  You may not go home alone in a taxi, shuttle [place] or bus, as the drivers will not be responsible for you.  --If you have any questions please call me at the above contact

## 2015-10-13 ENCOUNTER — Telehealth: Payer: Self-pay | Admitting: Gastroenterology

## 2015-10-13 ENCOUNTER — Other Ambulatory Visit: Payer: Self-pay

## 2015-10-13 NOTE — Telephone Encounter (Signed)
We'll stick with my original note, and have him ask the doctor doing the tests and biopsies if they will please do any further notes; I just don't know where he stands with testing, how long they expect before results; it will be best if that doctor takes over the work notes now

## 2015-10-13 NOTE — Telephone Encounter (Signed)
Please call patient. He has an upper endoscopy and some type of imaging both scheduled for Thursday December 1st. He says he didn't know they were both scheduled for the same day and wants to cancel the least important. Also, needs to speak with you about a doctors note. Thanks.

## 2015-10-13 NOTE — Telephone Encounter (Signed)
Patient notified

## 2015-10-13 NOTE — Telephone Encounter (Signed)
Spoke with pt regarding his procedure. Advised him to keep the EUS appt for Thursday. We will reschedule the small bowel follow through. I have contacted central scheduling and they will call and reschedule. Work note has been completed. Tried to send through [place], but told pt he may need to pick this up.

## 2015-10-14 ENCOUNTER — Encounter: Payer: Self-pay | Admitting: *Deleted

## 2015-10-15 ENCOUNTER — Ambulatory Visit: Payer: BLUE CROSS/BLUE SHIELD | Admitting: Registered Nurse

## 2015-10-15 ENCOUNTER — Encounter
Admission: RE | Disposition: A | Discharge status: Home / Self Care | Payer: Self-pay | Source: Ambulatory Visit | Attending: Internal Medicine

## 2015-10-15 ENCOUNTER — Encounter: Payer: Self-pay | Admitting: *Deleted

## 2015-10-15 ENCOUNTER — Ambulatory Visit
Admission: RE | Admit: 2015-10-15 | Discharge: 2015-10-15 | Disposition: A | Discharge status: Home / Self Care | Payer: BLUE CROSS/BLUE SHIELD | Source: Ambulatory Visit | Attending: Internal Medicine | Admitting: Internal Medicine

## 2015-10-15 ENCOUNTER — Ambulatory Visit: Payer: BLUE CROSS/BLUE SHIELD

## 2015-10-15 DIAGNOSIS — E559 Vitamin D deficiency, unspecified: Secondary | ICD-10-CM | POA: Insufficient documentation

## 2015-10-15 DIAGNOSIS — Z79899 Other long term (current) drug therapy: Secondary | ICD-10-CM | POA: Diagnosis not present

## 2015-10-15 DIAGNOSIS — I10 Essential (primary) hypertension: Secondary | ICD-10-CM | POA: Insufficient documentation

## 2015-10-15 DIAGNOSIS — F17210 Nicotine dependence, cigarettes, uncomplicated: Secondary | ICD-10-CM | POA: Insufficient documentation

## 2015-10-15 DIAGNOSIS — E785 Hyperlipidemia, unspecified: Secondary | ICD-10-CM | POA: Insufficient documentation

## 2015-10-15 DIAGNOSIS — Z7951 Long term (current) use of inhaled steroids: Secondary | ICD-10-CM | POA: Diagnosis not present

## 2015-10-15 DIAGNOSIS — K2980 Duodenitis without bleeding: Secondary | ICD-10-CM | POA: Diagnosis not present

## 2015-10-15 DIAGNOSIS — K219 Gastro-esophageal reflux disease without esophagitis: Secondary | ICD-10-CM | POA: Diagnosis not present

## 2015-10-15 DIAGNOSIS — E109 Type 1 diabetes mellitus without complications: Secondary | ICD-10-CM | POA: Insufficient documentation

## 2015-10-15 DIAGNOSIS — K317 Polyp of stomach and duodenum: Secondary | ICD-10-CM | POA: Diagnosis present

## 2015-10-15 HISTORY — PX: EUS: SHX5427

## 2015-10-15 LAB — GLUCOSE, CAPILLARY: Glucose-Capillary: 314 mg/dL — ABNORMAL HIGH (ref 65–99)

## 2015-10-15 SURGERY — UPPER ENDOSCOPIC ULTRASOUND (EUS) LINEAR
Anesthesia: General

## 2015-10-15 MED ORDER — MIDAZOLAM HCL 5 MG/5ML IJ SOLN
INTRAMUSCULAR | Status: DC | PRN
  Administered 2015-10-15: 2 mg via INTRAVENOUS

## 2015-10-15 MED ORDER — GLYCOPYRROLATE 0.2 MG/ML IJ SOLN
INTRAMUSCULAR | Status: DC | PRN
  Administered 2015-10-15: 0.2 mg via INTRAVENOUS

## 2015-10-15 MED ORDER — PROPOFOL 500 MG/50ML IV EMUL
INTRAVENOUS | Status: DC | PRN
  Administered 2015-10-15: 120 ug/kg/min via INTRAVENOUS

## 2015-10-15 MED ORDER — PROPOFOL 10 MG/ML IV BOLUS
INTRAVENOUS | Status: DC | PRN
  Administered 2015-10-15: 30 mg via INTRAVENOUS
  Administered 2015-10-15: 70 mg via INTRAVENOUS

## 2015-10-15 MED ORDER — SODIUM CHLORIDE 0.9 % IV SOLN
INTRAVENOUS | Status: DC
  Administered 2015-10-15: 13:00:00 via INTRAVENOUS

## 2015-10-15 MED ORDER — METHYLENE BLUE 1 % INJ SOLN
INTRAMUSCULAR | Status: AC
  Filled 2015-10-15: qty 1

## 2015-10-15 MED ORDER — LIDOCAINE HCL (CARDIAC) 20 MG/ML IV SOLN
INTRAVENOUS | Status: DC | PRN
  Administered 2015-10-15: 50 mg via INTRAVENOUS

## 2015-10-15 NOTE — H&P (View-Only) (Signed)
[facility]  [address], [place], [place] [address] Phone: [phone] Fax : [phone]  Primary Care Physician:  Golden Pop, MD Primary Gastroenterologist:  Dr. Allen Norris  Pre-Procedure History & Physical: HPI:  Henry Fuller is a 39 y.o. male is here for an endoscopy.   Past Medical History  Diagnosis Date  . Hyperlipidemia   . Hypogonadism in male   . Vitamin D deficiency   . Erectile dysfunction   . Hypertension     controlled on meds  . Diabetes mellitus without complication ([place])     type 2  . Loose tooth due to trauma     front  . GERD (gastroesophageal reflux disease)     Past Surgical History  Procedure Laterality Date  . Vasectomy  2001  . Hernia repair  [id number]    umbilical  . Colonoscopy with propofol N/A 09/17/2015    Procedure: COLONOSCOPY WITH PROPOFOL;  Surgeon: [name], MD;  Location: [place];  Service: Endoscopy;  Laterality: N/A;  DIABETIC  . Esophagogastroduodenoscopy (egd) with propofol N/A 09/17/2015    Procedure: ESOPHAGOGASTRODUODENOSCOPY (EGD) WITH PROPOFOL;  Surgeon: [name], MD;  Location: [place];  Service: Endoscopy;  Laterality: N/A;    Prior to Admission medications   Medication Sig Start Date End Date Taking? Authorizing Provider  benazepril-hydrochlorthiazide (LOTENSIN HCT) 20-12.5 MG tablet Take 1 tablet by mouth daily. New strength 08/24/15  Yes Arnetha Courser, MD  insulin NPH-regular Human (NOVOLIN 70/30) (70-30) 100 UNIT/ML injection Inject 12 Units into the skin 2 (two) times daily with a meal.   Yes Historical Provider, MD  omeprazole (PRILOSEC) 40 MG capsule Take 1 capsule (40 mg total) by mouth daily. One by mouth daily 09/28/15  Yes Arnetha Courser, MD  sucralfate (CARAFATE) 1 GM/10ML suspension Take 10 mLs (1 g total) by mouth 4 (four) times daily -  with meals and at bedtime. 09/04/15  Yes Arnetha Courser, MD  fluticasone (FLONASE) 50 MCG/ACT nasal spray Place 2 sprays  into both nostrils as needed.     Historical Provider, MD  ondansetron (ZOFRAN ODT) 4 MG disintegrating tablet Take 1 tablet (4 mg total) by mouth every 8 (eight) hours as needed for nausea or vomiting. Patient not taking: Reported on 09/15/2015 08/29/15   Loney Hering, MD    Allergies as of 09/22/2015 - Review Complete 09/17/2015  Allergen Reaction Noted  . Atenolol Other (See Comments) 12/31/2014    Family History  Problem Relation Age of Onset  . Diabetes Mother   . Hypertension Mother   . Cancer Father     colon cancer  . Diabetes Maternal Grandmother   . Hypertension Maternal Grandmother   . Liver disease Maternal Grandfather   . Cancer Paternal Grandmother   . Stroke Paternal Grandfather     Social History   Social History  . Marital Status: Married    Spouse Name: N/A  . Number of Children: N/A  . Years of Education: N/A   Occupational History  . Not on file.   Social History Main Topics  . Smoking status: Current Every Day Smoker -- 0.50 packs/day for 9 years    Types: Cigarettes  . Smokeless tobacco: Never Used  . Alcohol Use: Yes     Comment: socially   . Drug Use: No  . Sexual Activity: Yes   Other Topics Concern  . Not on file   Social History Narrative    Review of Systems: See HPI,  otherwise negative ROS  Physical Exam: Ht 5\' 9"  (1.753 m)  Wt 164 lb (74.39 kg)  BMI 24.21 kg/m2 General:   Alert,  pleasant and cooperative in NAD Head:  Normocephalic and atraumatic. Neck:  Supple; no masses or thyromegaly. Lungs:  Clear throughout to auscultation.    Heart:  Regular rate and rhythm. Abdomen:  Soft, nontender and nondistended. Normal bowel sounds, without guarding, and without rebound.   Neurologic:  Alert and  oriented x4;  grossly normal neurologically.  Impression/Plan: Henry Fuller is here for an endoscopy to be performed for duodenal polyp  Risks, benefits, limitations, and alternatives regarding  endoscopy have been  reviewed with the patient.  Questions have been answered.  All parties agreeable.   Ollen Bowl, MD  10/05/2015, 8:32 AM

## 2015-10-15 NOTE — Anesthesia Postprocedure Evaluation (Signed)
Anesthesia Post Note  Patient: Henry Fuller  Procedure(s) Performed: Procedure(s) (LRB): UPPER ENDOSCOPIC ULTRASOUND (EUS) LINEAR (N/A)  Patient location during evaluation: PACU Anesthesia Type: General Level of consciousness: awake Pain management: pain level controlled Vital Signs Assessment: post-procedure vital signs reviewed and stable Respiratory status: spontaneous breathing Cardiovascular status: blood pressure returned to baseline Anesthetic complications: no    Last Vitals:  Filed Vitals:   10/15/15 1244 10/15/15 1357  BP: 146/105 140/96  Pulse: 100 105  Temp: 36.2 C 36.8 C  Resp: 18 18    Last Pain: There were no vitals filed for this visit.               VAN STAVEREN,Dryden Tapley

## 2015-10-15 NOTE — Anesthesia Preprocedure Evaluation (Signed)
Anesthesia Evaluation  Patient identified by MRN, date of birth, ID band Patient awake    Reviewed: Allergy & Precautions, NPO status , Patient's Chart, lab work & pertinent test results  Airway Mallampati: II       Dental   Pulmonary Current Smoker,     + decreased breath sounds      Cardiovascular Exercise Tolerance: Good hypertension, Pt. on medications Normal cardiovascular exam Rhythm:Regular     Neuro/Psych Anxiety    GI/Hepatic Neg liver ROS, GERD  ,  Endo/Other  diabetes, Type 1, Insulin Dependent  Renal/GU negative Renal ROS     Musculoskeletal   Abdominal Normal abdominal exam  (+)   Peds  Hematology   Anesthesia Other Findings   Reproductive/Obstetrics                             Anesthesia Physical Anesthesia Plan  ASA: III  Anesthesia Plan: General   Post-op Pain Management:    Induction: Intravenous  Airway Management Planned: Nasal Cannula  Additional Equipment:   Intra-op Plan:   Post-operative Plan:   Informed Consent: I have reviewed the patients History and Physical, chart, labs and discussed the procedure including the risks, benefits and alternatives for the proposed anesthesia with the patient or authorized representative who has indicated his/her understanding and acceptance.     Plan Discussed with: CRNA  Anesthesia Plan Comments:         Anesthesia Quick Evaluation

## 2015-10-15 NOTE — Interval H&P Note (Signed)
History and Physical Interval Note:  10/15/2015 1:04 PM  Henry Fuller  has presented today for surgery, with the diagnosis of DUODENAL LESION  The various methods of treatment have been discussed with the patient and family. After consideration of risks, benefits and other options for treatment, the patient has consented to  Procedure(s): UPPER ENDOSCOPIC ULTRASOUND (EUS) LINEAR (N/A) as a surgical intervention .  The patient's history has been reviewed, patient examined, no change in status, stable for surgery.  I have reviewed the patient's chart and labs.  Questions were answered to the patient's satisfaction.     Tillie Rung

## 2015-10-15 NOTE — Transfer of Care (Signed)
Immediate Anesthesia Transfer of Care Note  Patient: Henry Fuller  Procedure(s) Performed: Procedure(s): UPPER ENDOSCOPIC ULTRASOUND (EUS) LINEAR (N/A)  Patient Location: Endoscopy Unit  Anesthesia Type:General  Level of Consciousness: awake  Airway & Oxygen Therapy: Patient Spontanous Breathing and Patient connected to nasal cannula oxygen  Post-op Assessment: Report given to RN  Post vital signs: Reviewed  Last Vitals:  Filed Vitals:   10/15/15 1244 10/15/15 1357  BP: 146/105 140/96  Pulse: 100 105  Temp: 36.2 C 36.8 C  Resp: 18 18    Complications: No apparent anesthesia complications

## 2015-10-15 NOTE — Op Note (Addendum)
[facility] Patient Name: Henry Fuller Procedure Date: 10/15/2015 7:51 AM MRN: [id number] Account #: [id number] Date of Birth: 1976-07-26 Admit Type: Outpatient Age: 39 Room: [facility] ENDO ROOM 3 Gender: Male Note Status: Addendum Procedure:         Upper EUS Indications:       Duodenal mucosal mass/polyp found on endoscopy Patient Profile:   Refer to note in patient chart for documentation of                     history and physical. Providers:         Murray Hodgkins. Laportia Carley Referring MD:      [name], MD (Referring MD), Guadalupe Maple, MD                     (Referring MD) Medicines:         Propofol per Anesthesia Complications:     No immediate complications. Procedure:         Pre-Anesthesia Assessment:                    Prior to the procedure, a History and Physical was                     performed, and patient medications and allergies were                     reviewed. The patient is competent. The risks and benefits                     of the procedure and the sedation options and risks were                     discussed with the patient. All questions were answered                     and informed consent was obtained. Patient identification                     and proposed procedure were verified by the physician, the                     nurse and the anesthesiologist in the pre-procedure area.                     Mental Status Examination: alert and oriented. Airway                     Examination: normal oropharyngeal airway and neck                     mobility. Respiratory Examination: clear to auscultation.                     CV Examination: normal. Prophylactic Antibiotics: The                     patient does not require prophylactic antibiotics. Prior                     Anticoagulants: The patient has taken no previous                     anticoagulant or antiplatelet agents. ASA Grade  Assessment: II -  A patient with mild systemic disease.                     After reviewing the risks and benefits, the patient was                     deemed in satisfactory condition to undergo the procedure.                     The anesthesia plan was to use monitored anesthesia care                     (MAC). Immediately prior to administration of medications,                     the patient was re-assessed for adequacy to receive                     sedatives. The heart rate, respiratory rate, oxygen                     saturations, blood pressure, adequacy of pulmonary                     ventilation, and response to care were monitored                     throughout the procedure. The physical status of the                     patient was re-assessed after the procedure.                    After obtaining informed consent, the endoscope was passed                     under direct vision. Throughout the procedure, the                     patient's blood pressure, pulse, and oxygen saturations                     were monitored continuously. The EUS GI Radial Array                     [id number] was introduced through the mouth, and advanced to                     the duodenum for ultrasound examination from the stomach                     and duodenum. The Olympus GIF-IT100 endoscope (S#:                     [id number]) was introduced through the mouth, and advanced to                     the second part of duodenum. The upper EUS was                     accomplished without difficulty. The patient tolerated the                     procedure well. Findings:      Endoscopic Finding :      The  examined esophagus was endoscopically normal.      The entire examined stomach was endoscopically normal.      A single small mucosal nodule with central umbilication was found in the       first part of the duodenum. Tunnel biopsies were taken with a cold       forceps for histology.      The 2nd part of the duodenum  was normal.      Endosonographic Finding :      An oval intramural (subepithelial) lesion was found in the apex of the       duodenal bulb. The lesion was isoechoic. Endosonographically, the lesion       appeared to originate from within the luminal interface/superficial       mucosa (Layer 1) and deep mucosa (Layer 2). The lesion also measured       10.7 mm by 5.2 mm in diameter. The outer margins were well defined. An       intact interface was seen between the mass and the adjacent structures       suggesting a lack of invasion.      There was no sign of significant endosonographic abnormality in the       pancreatic head.      There was no sign of significant endosonographic abnormality in the       common bile duct. The maximum diameter of the duct was 2.9 mm.      No lymphadenopathy seen. Impression:        EGD Impressions:                    - Normal esophagus.                    - Normal stomach.                    - Mucosal nodule with a central umbilication found in the                     duodenum. Biopsied. Endoscopic appearance is suggestive of                     a pancreas rest. Less likely a neuroendocrine tumor.                    - Normal 2nd part of the duodenum.                    EUS Impressions:                    - An intramural (subepithelial) lesion was found in the                     apex of the duodenal bulb. The lesion appeared to                     originate from within the luminal interface/superficial                     mucosa (Layer 1) and deep mucosa (Layer 2). No concerning                     features on EUS examination.                    -  There was no sign of significant pathology in the                     pancreatic head.                    - There was no sign of significant pathology in the common                     bile duct.                    - No lymphadenopathy seen. Recommendation:    - Discharge patient to home (ambulatory).                     - Await path results.                    - Consider a repeat EGD in 1 year for surveillance if                     tunnel biopsies are nondiagnostic. No further evaluation                     necessary if pathology diagnostic of a benign lesion.                    - The findings and recommendations were discussed with the                     patient and his family.                    - Return to referring physician as previously scheduled. Procedure Code(s): --- Professional ---                    [phone], Esophagogastroduodenoscopy, flexible, transoral;                     with endoscopic ultrasound examination, including the                     esophagus, stomach, and either the duodenum or a                     surgically altered stomach where the jejunum is examined                     distal to the anastomosis                    43239, 64, Esophagogastroduodenoscopy, flexible,                     transoral; with biopsy, single or multiple Diagnosis Code(s): --- Professional ---                    K31.89, Other diseases of stomach and duodenum CPT copyright 2014 American Medical Association. All rights reserved. The codes documented in this report are preliminary and upon coder review may  be revised to meet current compliance requirements. Attending Participation:      I personally performed the entire procedure without the assistance of a       fellow, resident or surgical assistant. [place],  10/15/2015 1:59:01 PM This report has been signed electronically. Number of Addenda: 1 Note Initiated On: 10/15/2015 7:51 AM  [facility]

## 2015-10-19 ENCOUNTER — Encounter: Payer: Self-pay | Admitting: Internal Medicine

## 2015-10-19 LAB — SURGICAL PATHOLOGY

## 2015-10-22 ENCOUNTER — Ambulatory Visit
Admission: RE | Admit: 2015-10-22 | Discharge: 2015-10-22 | Disposition: A | Discharge status: Home / Self Care | Payer: BLUE CROSS/BLUE SHIELD | Source: Ambulatory Visit | Attending: Gastroenterology | Admitting: Gastroenterology

## 2015-10-22 DIAGNOSIS — D132 Benign neoplasm of duodenum: Secondary | ICD-10-CM | POA: Insufficient documentation

## 2015-10-26 ENCOUNTER — Telehealth: Payer: Self-pay

## 2015-10-26 ENCOUNTER — Other Ambulatory Visit: Payer: Self-pay

## 2015-10-26 DIAGNOSIS — R112 Nausea with vomiting, unspecified: Secondary | ICD-10-CM

## 2015-10-26 DIAGNOSIS — K2970 Gastritis, unspecified, without bleeding: Secondary | ICD-10-CM

## 2015-10-26 DIAGNOSIS — K2990 Gastroduodenitis, unspecified, without bleeding: Secondary | ICD-10-CM

## 2015-10-26 MED ORDER — ONDANSETRON HCL 8 MG PO TABS: 8.0000 mg | ORAL_TABLET | Freq: Three times a day (TID) | ORAL | Status: DC | PRN

## 2015-10-26 MED ORDER — OMEPRAZOLE 40 MG PO CPDR: 40.0000 mg | DELAYED_RELEASE_CAPSULE | Freq: Every day | ORAL | Status: DC

## 2015-10-26 NOTE — Telephone Encounter (Signed)
-----   Message from [name], MD sent at 10/25/2015  4:46 PM EST ----- Let the patient know the small bowel exam did not show any other abnormal findings to explain his symptoms. The patient should be set up for an EUS of the duodenal lesion.

## 2015-10-26 NOTE — Telephone Encounter (Signed)
Patient called for results today, I gave patient his result, looks like everything is normal and repeat EGD in 1 year, per patient had a bad weekend vomiting and he is asking is there any other tests or ideas to help him.

## 2015-10-26 NOTE — Telephone Encounter (Signed)
Contacted pt to inform him of his negative results from the small bowel follow through and EUS. [facility] doctors are recommending repeat EGD in 1 year. Pt advised per Dr. Allen Norris, he has completed all testing for his current symptoms. Pt stated he had stopped the Omeprazole 40mg  daily. Advised him to restart this and new rx for Zofran 8mg  has been sent to pharmacy. Ok'd per Dr. Allen Norris. Pt will contact me if symptoms worsen or continue with no relief.

## 2015-10-26 NOTE — Telephone Encounter (Signed)
Please review Pt's EUS. He had this done on 10/15/15 and advise. I will call pt with both results.

## 2015-10-26 NOTE — Telephone Encounter (Signed)
The biopsy was noncancerous and the [facility] doctor recommended repeat upper endoscopy in 1 year

## 2015-10-26 NOTE — Telephone Encounter (Signed)
Per Dr. Allen Fuller, all test to check his symptoms have been done. All results were negative. Pt has been advised to restart Omeprazole 40mg  daily. Pt hasn't been taking this. Also, rx for Zofran 8mg  has been sent to pharmacy to help with nausea. He has been advised to call me if the symptoms do not improve.

## 2015-10-26 NOTE — Progress Notes (Signed)
Please review EUS in system. Pt has already had this done.

## 2016-12-15 ENCOUNTER — Encounter: Payer: Self-pay | Admitting: Family Medicine

## 2016-12-15 ENCOUNTER — Ambulatory Visit (INDEPENDENT_AMBULATORY_CARE_PROVIDER_SITE_OTHER): Payer: BLUE CROSS/BLUE SHIELD | Admitting: Family Medicine

## 2016-12-15 DIAGNOSIS — G8929 Other chronic pain: Secondary | ICD-10-CM

## 2016-12-15 DIAGNOSIS — M5430 Sciatica, unspecified side: Secondary | ICD-10-CM | POA: Insufficient documentation

## 2016-12-15 DIAGNOSIS — M545 Low back pain, unspecified: Secondary | ICD-10-CM | POA: Insufficient documentation

## 2016-12-15 DIAGNOSIS — Z5181 Encounter for therapeutic drug level monitoring: Secondary | ICD-10-CM | POA: Diagnosis not present

## 2016-12-15 DIAGNOSIS — Z720 Tobacco use: Secondary | ICD-10-CM

## 2016-12-15 DIAGNOSIS — I7090 Unspecified atherosclerosis: Secondary | ICD-10-CM | POA: Diagnosis not present

## 2016-12-15 DIAGNOSIS — F17200 Nicotine dependence, unspecified, uncomplicated: Secondary | ICD-10-CM | POA: Diagnosis not present

## 2016-12-15 DIAGNOSIS — E1165 Type 2 diabetes mellitus with hyperglycemia: Secondary | ICD-10-CM | POA: Diagnosis not present

## 2016-12-15 DIAGNOSIS — M5431 Sciatica, right side: Secondary | ICD-10-CM

## 2016-12-15 DIAGNOSIS — I10 Essential (primary) hypertension: Secondary | ICD-10-CM | POA: Diagnosis not present

## 2016-12-15 DIAGNOSIS — E1142 Type 2 diabetes mellitus with diabetic polyneuropathy: Secondary | ICD-10-CM | POA: Diagnosis not present

## 2016-12-15 DIAGNOSIS — Z794 Long term (current) use of insulin: Secondary | ICD-10-CM

## 2016-12-15 DIAGNOSIS — M5441 Lumbago with sciatica, right side: Secondary | ICD-10-CM

## 2016-12-15 LAB — LIPID PANEL
CHOL/HDL RATIO: 5.1 ratio — AB (ref <5.0)
CHOLESTEROL: 159 mg/dL (ref <200)
HDL: 31 mg/dL — ABNORMAL LOW (ref >40)
LDL Cholesterol: 103 mg/dL — ABNORMAL HIGH (ref <100)
TRIGLYCERIDES: 123 mg/dL (ref <150)
VLDL: 25 mg/dL (ref <30)

## 2016-12-15 LAB — COMPLETE METABOLIC PANEL WITH GFR
ALT: 13 U/L (ref 9–46)
AST: 11 U/L (ref 10–40)
Albumin: 4.2 g/dL (ref 3.6–5.1)
Alkaline Phosphatase: 107 U/L (ref 40–115)
BUN: 7 mg/dL (ref 7–25)
CALCIUM: 9.2 mg/dL (ref 8.6–10.3)
CHLORIDE: 101 mmol/L (ref 98–110)
CO2: 27 mmol/L (ref 20–31)
CREATININE: 0.95 mg/dL (ref 0.60–1.35)
GFR, Est Non African American: >89 mL/min (ref >=60)
Glucose, Bld: 344 mg/dL — ABNORMAL HIGH (ref 65–99)
Potassium: 4.5 mmol/L (ref 3.5–5.3)
Sodium: 135 mmol/L (ref 135–146)
Total Bilirubin: 1 mg/dL (ref 0.2–1.2)
Total Protein: 7.2 g/dL (ref 6.1–8.1)

## 2016-12-15 LAB — CBC WITH DIFFERENTIAL/PLATELET
BASOS ABS: 107 {cells}/uL (ref 0–200)
BASOS PCT: 1 %
EOS ABS: 214 {cells}/uL (ref 15–500)
Eosinophils Relative: 2 %
HEMATOCRIT: 48.9 % (ref 38.5–50.0)
Hemoglobin: 16.5 g/dL (ref 13.2–17.1)
LYMPHS PCT: 27 %
Lymphs Abs: 2889 cells/uL (ref 850–3900)
MCH: 29.9 pg (ref 27.0–33.0)
MCHC: 33.7 g/dL (ref 32.0–36.0)
MCV: 88.6 fL (ref 80.0–100.0)
MONO ABS: 642 {cells}/uL (ref 200–950)
MPV: 9.5 fL (ref 7.5–12.5)
Monocytes Relative: 6 %
NEUTROS PCT: 64 %
Neutro Abs: 6848 cells/uL (ref 1500–7800)
Platelets: 346 10*3/uL (ref 140–400)
RBC: 5.52 MIL/uL (ref 4.20–5.80)
RDW: 13.6 % (ref 11.0–15.0)
WBC: 10.7 10*3/uL (ref 3.8–10.8)

## 2016-12-15 MED ORDER — ATORVASTATIN CALCIUM 10 MG PO TABS: 10.0000 mg | ORAL_TABLET | Freq: Every day | ORAL | 1 refills | Status: DC

## 2016-12-15 MED ORDER — BENAZEPRIL-HYDROCHLOROTHIAZIDE 20-12.5 MG PO TABS: 1.0000 | ORAL_TABLET | Freq: Every day | ORAL | 2 refills | Status: DC

## 2016-12-15 NOTE — Assessment & Plan Note (Addendum)
Uncontrolled; try DASH guidelines; start back on antihypertensive; return in 2 weeks for recheck and will get BMP then

## 2016-12-15 NOTE — Assessment & Plan Note (Addendum)
Managed by Dr. Gabriel Carina; foot exam today; warned patient about complications of uncontrolled diabetes and urged him to call her today; discussed blindness as a consequence; discussed dialysis and explained what that was like; cautioned about PVD, foot ulcers, and the need for amputations, loss of limb; explained that we really want him to take care of himself and manage his diabetes with his specialist to help avoid these complications; even though he is feeling good now, complications can occur down the road and he'll wish he had taken better care of himself; he agrees to call her

## 2016-12-15 NOTE — Assessment & Plan Note (Signed)
Urged patient to quit and warned about dangers of smoking
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
## 2016-12-15 NOTE — Patient Instructions (Addendum)
I urge you to call Dr. Joycie Peek office today and get an appointment for a visit and labs with her I do encourage you to quit smoking Call [phone] to sign up for smoking cessation classes You can call 1-800-QUIT-NOW to talk with a smoking cessation coach Start back on the high blood pressure medicine Return in 2 weeks to see me   Smoking Hazards Smoking cigarettes is extremely bad for your health. Tobacco smoke has over 200 known poisons in it. It contains the poisonous gases nitrogen oxide and carbon monoxide. There are over 60 chemicals in tobacco smoke that cause cancer. Some of the chemicals found in cigarette smoke include:   Cyanide.   Benzene.   Formaldehyde.   Methanol (wood alcohol).   Acetylene (fuel used in welding torches).   Ammonia.  Even smoking lightly shortens your life expectancy by several years. You can greatly reduce the risk of medical problems for you and your family by stopping now. Smoking is the most preventable cause of death and disease in our society. Within days of quitting smoking, your circulation improves, you decrease the risk of having a heart attack, and your lung capacity improves. There may be some increased phlegm in the first few days after quitting, and it may take months for your lungs to clear up completely. Quitting for 10 years reduces your risk of developing lung cancer to almost that of a nonsmoker.  WHAT ARE THE RISKS OF SMOKING? Cigarette smokers have an increased risk of many serious medical problems, including:  Lung cancer.   Lung disease (such as pneumonia, bronchitis, and emphysema).   Heart attack and chest pain due to the heart not getting enough oxygen (angina).   Heart disease and peripheral blood vessel disease.   Hypertension.   Stroke.   Oral cancer (cancer of the lip, mouth, or voice box).   Bladder cancer.   Pancreatic cancer.   Cervical cancer.   Pregnancy complications, including  premature birth.   Stillbirths and smaller newborn babies, birth defects, and genetic damage to sperm.   Early menopause.   Lower estrogen level for women.   Infertility.   Facial wrinkles.   Blindness.   Increased risk of broken bones (fractures).   Senile dementia.   Stomach ulcers and internal bleeding.   Delayed wound healing and increased risk of complications during surgery. Because of secondhand smoke exposure, children of smokers have an increased risk of the following:   Sudden infant death syndrome (SIDS).   Respiratory infections.   Lung cancer.   Heart disease.   Ear infections.  WHY IS SMOKING ADDICTIVE? Nicotine is the chemical agent in tobacco that is capable of causing addiction or dependence. When you smoke and inhale, nicotine is absorbed rapidly into the bloodstream through your lungs. Both inhaled and noninhaled nicotine may be addictive.  WHAT ARE THE BENEFITS OF QUITTING?  There are many health benefits to quitting smoking. Some are:   The likelihood of developing cancer and heart disease decreases. Health improvements are seen almost immediately.   Blood pressure, pulse rate, and breathing patterns start returning to normal soon after quitting.   People who quit may see an improvement in their overall quality of life.  HOW DO YOU QUIT SMOKING? Smoking is an addiction with both physical and psychological effects, and longtime habits can be hard to change. Your health care provider can recommend:  Programs and community resources, which may include group support, education, or therapy.  Replacement products, such as patches,  gum, and nasal sprays. Use these products only as directed. Do not replace cigarette smoking with electronic cigarettes (commonly called e-cigarettes). The safety of e-cigarettes is unknown, and some may contain harmful chemicals. FOR MORE INFORMATION  American Lung Association: www.lung.org  American  Cancer Society: www.cancer.org This information is not intended to replace advice given to you by your health care provider. Make sure you discuss any questions you have with your health care provider. Document Released: 12/08/2004 Document Revised: 02/22/2016 Document Reviewed: 04/22/2013 Elsevier Interactive Patient Education  2017 [place].  Sciatica Introduction Sciatica is pain, numbness, weakness, or tingling along your sciatic nerve. The sciatic nerve starts in the lower back and goes down the back of each leg. Sciatica happens when this nerve is pinched or has pressure put on it. Sciatica usually goes away on its own or with treatment. Sometimes, sciatica may keep coming back (recur). Follow these instructions at home: Medicines  Take over-the-counter and prescription medicines only as told by your doctor.  Do not drive or use heavy machinery while taking prescription pain medicine. Managing pain  If directed, put ice on the affected area.  Put ice in a plastic bag.  Place a towel between your skin and the bag.  Leave the ice on for 20 minutes, 2-3 times a day.  After icing, apply heat to the affected area before you exercise or as often as told by your doctor. Use the heat source that your doctor tells you to use, such as a moist heat pack or a heating pad.  Place a towel between your skin and the heat source.  Leave the heat on for 20-30 minutes.  Remove the heat if your skin turns bright red. This is especially important if you are unable to feel pain, heat, or cold. You may have a greater risk of getting burned. Activity  Return to your normal activities as told by your doctor. Ask your doctor what activities are safe for you.  Avoid activities that make your sciatica worse.  Take short rests during the day. Rest in a lying or standing position. This is usually better than sitting to rest.  When you rest for a long time, do some physical activity or stretching  between periods of rest.  Avoid sitting for a long time without moving. Get up and move around at least one time each hour.  Exercise and stretch regularly, as told by your doctor.  Do not lift anything that is heavier than 10 lb (4.5 kg) while you have symptoms of sciatica.  Avoid lifting heavy things even when you do not have symptoms.  Avoid lifting heavy things over and over.  When you lift objects, always lift in a way that is safe for your body. To do this, you should:  Bend your knees.  Keep the object close to your body.  Avoid twisting. General instructions  Use good posture.  Avoid leaning forward when you are sitting.  Avoid hunching over when you are standing.  Stay at a healthy weight.  Wear comfortable shoes that support your feet. Avoid wearing high heels.  Avoid sleeping on a mattress that is too soft or too hard. You might have less pain if you sleep on a mattress that is firm enough to support your back.  Keep all follow-up visits as told by your doctor. This is important. Contact a doctor if:  You have pain that:  Wakes you up when you are sleeping.  Gets worse when you lie  down.  Is worse than the pain you have had in the past.  Lasts longer than 4 weeks.  You lose weight for without trying. Get help right away if:  You cannot control when you pee (urinate) or poop (have a bowel movement).  You have weakness in any of these areas and it gets worse.  Lower back.  Lower belly (pelvis).  Butt (buttocks).  Legs.  You have redness or swelling of your back.  You have a burning feeling when you pee. This information is not intended to replace advice given to you by your health care provider. Make sure you discuss any questions you have with your health care provider. Document Released: 08/09/2008 Document Revised: 04/07/2016 Document Reviewed: 07/10/2015  2017 Elsevier

## 2016-12-15 NOTE — Assessment & Plan Note (Signed)
Check liver function, creatinine, -lytes

## 2016-12-15 NOTE — Assessment & Plan Note (Signed)
Refer to ortho, PT, and get films

## 2016-12-15 NOTE — Assessment & Plan Note (Signed)
Will check lipids and initiate statin after seeing labs (along with SGPT)

## 2016-12-15 NOTE — Assessment & Plan Note (Signed)
Start with lumbar films; refer to orthopaedist; refer to PT

## 2017-01-03 ENCOUNTER — Encounter: Payer: Self-pay | Admitting: Family Medicine

## 2017-01-03 ENCOUNTER — Ambulatory Visit
Admission: RE | Admit: 2017-01-03 | Discharge: 2017-01-03 | Disposition: A | Discharge status: Home / Self Care | Payer: BLUE CROSS/BLUE SHIELD | Source: Ambulatory Visit | Attending: Family Medicine | Admitting: Family Medicine

## 2017-01-03 ENCOUNTER — Ambulatory Visit (INDEPENDENT_AMBULATORY_CARE_PROVIDER_SITE_OTHER): Payer: BLUE CROSS/BLUE SHIELD | Admitting: Family Medicine

## 2017-01-03 VITALS — BP 114/72 | HR 99 | Temp 98.1°F | Resp 16 | Ht 68.2 in | Wt 156.0 lb

## 2017-01-03 DIAGNOSIS — M5441 Lumbago with sciatica, right side: Secondary | ICD-10-CM | POA: Insufficient documentation

## 2017-01-03 DIAGNOSIS — Z720 Tobacco use: Secondary | ICD-10-CM | POA: Diagnosis not present

## 2017-01-03 DIAGNOSIS — M545 Low back pain: Secondary | ICD-10-CM | POA: Diagnosis not present

## 2017-01-03 DIAGNOSIS — G8929 Other chronic pain: Secondary | ICD-10-CM | POA: Insufficient documentation

## 2017-01-03 DIAGNOSIS — Z0000 Encounter for general adult medical examination without abnormal findings: Secondary | ICD-10-CM

## 2017-01-03 NOTE — Patient Instructions (Addendum)
I do encourage you to quit smoking Call [phone] to sign up for smoking cessation classes You can call 1-800-QUIT-NOW to talk with a smoking cessation coach   Steps to Quit Smoking Smoking tobacco can be bad for your health. It can also affect almost every organ in your body. Smoking puts you and people around you at risk for many serious long-lasting (chronic) diseases. Quitting smoking is hard, but it is one of the best things that you can do for your health. It is never too late to quit. What are the benefits of quitting smoking? When you quit smoking, you lower your risk for getting serious diseases and conditions. They can include:  Lung cancer or lung disease.  Heart disease.  Stroke.  Heart attack.  Not being able to have children (infertility).  Weak bones (osteoporosis) and broken bones (fractures). If you have coughing, wheezing, and shortness of breath, those symptoms may get better when you quit. You may also get sick less often. If you are pregnant, quitting smoking can help to lower your chances of having a baby of low birth weight. What can I do to help me quit smoking? Talk with your doctor about what can help you quit smoking. Some things you can do (strategies) include:  Quitting smoking totally, instead of slowly cutting back how much you smoke over a period of time.  Going to in-person counseling. You are more likely to quit if you go to many counseling sessions.  Using resources and support systems, such as:  Online chats with a Social worker.  Phone quitlines.  Printed Furniture conservator/restorer.  Support groups or group counseling.  Text messaging programs.  Mobile phone apps or applications.  Taking medicines. Some of these medicines may have nicotine in them. If you are pregnant or breastfeeding, do not take any medicines to quit smoking unless your doctor says it is okay. Talk with your doctor about counseling or other things that can help you. Talk with  your doctor about using more than one strategy at the same time, such as taking medicines while you are also going to in-person counseling. This can help make quitting easier. What things can I do to make it easier to quit? Quitting smoking might feel very hard at first, but there is a lot that you can do to make it easier. Take these steps:  Talk to your family and friends. Ask them to support and encourage you.  Call phone quitlines, reach out to support groups, or work with a Social worker.  Ask people who smoke to not smoke around you.  Avoid places that make you want (trigger) to smoke, such as:  Bars.  Parties.  Smoke-break areas at work.  Spend time with people who do not smoke.  Lower the stress in your life. Stress can make you want to smoke. Try these things to help your stress:  Getting regular exercise.  Deep-breathing exercises.  Yoga.  Meditating.  Doing a body scan. To do this, close your eyes, focus on one area of your body at a time from head to toe, and notice which parts of your body are tense. Try to relax the muscles in those areas.  Download or buy apps on your mobile phone or tablet that can help you stick to your quit plan. There are many free apps, such as QuitGuide from the [employer] Office manager for Disease Control and Prevention). You can find more support from smokefree.gov and other websites. This information is not intended to replace  advice given to you by your health care provider. Make sure you discuss any questions you have with your health care provider. Document Released: 08/27/2009 Document Revised: 06/28/2016 Document Reviewed: 03/17/2015 Elsevier Interactive Patient Education  2017 [employer] Maintenance, Male A healthy lifestyle and preventative care can promote health and wellness.  Maintain regular health, dental, and eye exams.  Eat a healthy diet. Foods like vegetables, fruits, whole grains, low-fat dairy products, and lean protein  foods contain the nutrients you need and are low in calories. Decrease your intake of foods high in solid fats, added sugars, and salt. Get information about a proper diet from your health care provider, if necessary.  Regular physical exercise is one of the most important things you can do for your health. Most adults should get at least 150 minutes of moderate-intensity exercise (any activity that increases your heart rate and causes you to sweat) each week. In addition, most adults need muscle-strengthening exercises on 2 or more days a week.   Maintain a healthy weight. The body mass index (BMI) is a screening tool to identify possible weight problems. It provides an estimate of body fat based on height and weight. Your health care provider can find your BMI and can help you achieve or maintain a healthy weight. For males 20 years and older:  A BMI below 18.5 is considered underweight.  A BMI of 18.5 to 24.9 is normal.  A BMI of 25 to 29.9 is considered overweight.  A BMI of 30 and above is considered obese.  Maintain normal blood lipids and cholesterol by exercising and minimizing your intake of saturated fat. Eat a balanced diet with plenty of fruits and vegetables. Blood tests for lipids and cholesterol should begin at age 48 and be repeated every 5 years. If your lipid or cholesterol levels are high, you are over age 48, or you are at high risk for heart disease, you may need your cholesterol levels checked more frequently.Ongoing high lipid and cholesterol levels should be treated with medicines if diet and exercise are not working.  If you smoke, find out from your health care provider how to quit. If you do not use tobacco, do not start.  Lung cancer screening is recommended for adults aged 55-80 years who are at high risk for developing lung cancer because of a history of smoking. A yearly low-dose CT scan of the lungs is recommended for people who have at least a 30-pack-year history  of smoking and are current smokers or have quit within the past 15 years. A pack year of smoking is smoking an average of 1 pack of cigarettes a day for 1 year (for example, a 30-pack-year history of smoking could mean smoking 1 pack a day for 30 years or 2 packs a day for 15 years). Yearly screening should continue until the smoker has stopped smoking for at least 15 years. Yearly screening should be stopped for people who develop a health problem that would prevent them from having lung cancer treatment.  If you choose to drink alcohol, do not have more than 2 drinks per day. One drink is considered to be 12 oz (360 mL) of beer, 5 oz (150 mL) of wine, or 1.5 oz (45 mL) of liquor.  Avoid the use of street drugs. Do not share needles with anyone. Ask for help if you need support or instructions about stopping the use of drugs.  High blood pressure causes heart disease and increases the risk of  stroke. High blood pressure is more likely to develop in:  People who have blood pressure in the end of the normal range (100-139/85-89 mm Hg).  People who are overweight or obese.  People who are African American.  If you are 44-64 years of age, have your blood pressure checked every 3-5 years. If you are 80 years of age or older, have your blood pressure checked every year. You should have your blood pressure measured twice-once when you are at a hospital or clinic, and once when you are not at a hospital or clinic. Record the average of the two measurements. To check your blood pressure when you are not at a hospital or clinic, you can use:  An automated blood pressure machine at a pharmacy.  A home blood pressure monitor.  If you are 57-61 years old, ask your health care provider if you should take aspirin to prevent heart disease.  Diabetes screening involves taking a blood sample to check your fasting blood sugar level. This should be done once every 3 years after age 12 if you are at a normal  weight and without risk factors for diabetes. Testing should be considered at a younger age or be carried out more frequently if you are overweight and have at least 1 risk factor for diabetes.  Colorectal cancer can be detected and often prevented. Most routine colorectal cancer screening begins at the age of 62 and continues through age 55. However, your health care provider may recommend screening at an earlier age if you have risk factors for colon cancer. On a yearly basis, your health care provider may provide home test kits to check for hidden blood in the stool. A small camera at the end of a tube may be used to directly examine the colon (sigmoidoscopy or colonoscopy) to detect the earliest forms of colorectal cancer. Talk to your health care provider about this at age 65 when routine screening begins. A direct exam of the colon should be repeated every 5-10 years through age 28, unless early forms of precancerous polyps or small growths are found.  People who are at an increased risk for hepatitis B should be screened for this virus. You are considered at high risk for hepatitis B if:  You were born in a country where hepatitis B occurs often. Talk with your health care provider about which countries are considered high risk.  Your parents were born in a high-risk country and you have not received a shot to protect against hepatitis B (hepatitis B vaccine).  You have HIV or AIDS.  You use needles to inject street drugs.  You live with, or have sex with, someone who has hepatitis B.  You are a man who has sex with other men (MSM).  You get hemodialysis treatment.  You take certain medicines for conditions like cancer, organ transplantation, and autoimmune conditions.  Hepatitis C blood testing is recommended for all people born from [birth year] through 1966 and any individual with known risk factors for hepatitis C.  Healthy men should no longer receive prostate-specific antigen (PSA) blood  tests as part of routine cancer screening. Talk to your health care provider about prostate cancer screening.  Testicular cancer screening is not recommended for adolescents or adult males who have no symptoms. Screening includes self-exam, a health care provider exam, and other screening tests. Consult with your health care provider about any symptoms you have or any concerns you have about testicular cancer.  Practice safe sex. Use  condoms and avoid high-risk sexual practices to reduce the spread of sexually transmitted infections (STIs).  You should be screened for STIs, including gonorrhea and chlamydia if:  You are sexually active and are younger than 24 years.  You are older than 24 years, and your health care provider tells you that you are at risk for this type of infection.  Your sexual activity has changed since you were last screened, and you are at an increased risk for chlamydia or gonorrhea. Ask your health care provider if you are at risk.  If you are at risk of being infected with HIV, it is recommended that you take a prescription medicine daily to prevent HIV infection. This is called pre-exposure prophylaxis (PrEP). You are considered at risk if:  You are a man who has sex with other men (MSM).  You are a heterosexual man who is sexually active with multiple partners.  You take drugs by injection.  You are sexually active with a partner who has HIV.  Talk with your health care provider about whether you are at high risk of being infected with HIV. If you choose to begin PrEP, you should first be tested for HIV. You should then be tested every 3 months for as long as you are taking PrEP.  Use sunscreen. Apply sunscreen liberally and repeatedly throughout the day. You should seek shade when your shadow is shorter than you. Protect yourself by wearing long sleeves, pants, a wide-brimmed hat, and sunglasses year round whenever you are outdoors.  Tell your health care  provider of new moles or changes in moles, especially if there is a change in shape or color. Also, tell your health care provider if a mole is larger than the size of a pencil eraser.  A one-time screening for abdominal aortic aneurysm (AAA) and surgical repair of large AAAs by ultrasound is recommended for men aged 56-75 years who are current or former smokers.  Stay current with your vaccines (immunizations). This information is not intended to replace advice given to you by your health care provider. Make sure you discuss any questions you have with your health care provider. Document Released: 04/28/2008 Document Revised: 11/21/2014 Document Reviewed: 08/04/2015 Elsevier Interactive Patient Education  2017 [employer].

## 2017-01-03 NOTE — Progress Notes (Signed)
Patient ID: Henry Fuller, male   DOB: 1976/10/23, 41 y.o.   MRN: [id number]   Subjective:   Henry Fuller is a 41 y.o. male here for a complete physical exam  Interim issues since last visit: saw endocrinologist; diabetes management through her; from 75s to 100s to 200s; feels fine  USPSTF grade A and B recommendations Depression:  Depression screen [facility] 2/9 01/03/2017 12/15/2016  Decreased Interest 0 0  Down, Depressed, Hopeless 0 0  PHQ - 2 Score 0 0   Hypertension: controlled Obesity: no Alcohol: no more than 14 drinks per week Tobacco use: never more than a pack a day; smokes at work and driving; not in the house; not a chain smoker; pretty much habit; smokes with friend at work; does not want to do vapor Lipids: Dec 15, 2016; reviewed with patient Glucose: just followed at endo Colorectal cancer: father had colon cancer, diagnosed early 56's; colonoscopy done by Dr. Allen Norris in 2016; every five years Prostate cancer: no fam hx; due at age 40 Breast cancer: no lumps Lung cancer: n/a Osteoporosis: n/a AAA: n/a Aspirin: 81 mg aspirin Diet: probably excess saturated fats; loves greens Exercise: does a lot of walking, 10k steps a day Skin cancer: no worrisome moles Vaccines: patient declined flu shot today; PPSV-23 UTD, tetanus UTD  Past Medical History:  Diagnosis Date  . Diabetes mellitus without complication ([name])    type 2  . Erectile dysfunction   . GERD (gastroesophageal reflux disease)   . Hyperlipidemia   . Hypertension    controlled on meds  . Hypogonadism in male   . Loose tooth due to trauma    front  . Vitamin D deficiency    Past Surgical History:  Procedure Laterality Date  . COLONOSCOPY WITH PROPOFOL N/A 09/17/2015   Procedure: COLONOSCOPY WITH PROPOFOL;  Surgeon: [name], MD;  Location: [place];  Service: Endoscopy;  Laterality: N/A;  DIABETIC  . ESOPHAGOGASTRODUODENOSCOPY (EGD) WITH PROPOFOL N/A 09/17/2015   Procedure:  ESOPHAGOGASTRODUODENOSCOPY (EGD) WITH PROPOFOL;  Surgeon: [name], MD;  Location: [place];  Service: Endoscopy;  Laterality: N/A;  . ESOPHAGOGASTRODUODENOSCOPY (EGD) WITH PROPOFOL N/A 10/05/2015   Procedure: ESOPHAGOGASTRODUODENOSCOPY (EGD) WITH PROPOFOL;  Surgeon: [name], MD;  Location: [place];  Service: Endoscopy;  Laterality: N/A;  . EUS N/A 10/15/2015   Procedure: UPPER ENDOSCOPIC ULTRASOUND (EUS) LINEAR;  Surgeon: [name], MD;  Location: [facility] END[facility];  Service: Gastroenterology;  Laterality: N/A;  . HERNIA REPAIR  [id number]   umbilical  . VASECTOMY  [id number]   Family History  Problem Relation Age of Onset  . Diabetes Mother   . Hypertension Mother   . Cancer Father     colon cancer  . Diabetes Brother   . Hypertension Brother   . Diabetes Maternal Grandmother   . Hypertension Maternal Grandmother   . Heart disease Maternal Grandmother   . Liver disease Maternal Grandfather   . Cancer Paternal Grandmother   . Stroke Paternal Grandfather   . Diabetes Brother   . Hypertension Brother    Social History  Substance Use Topics  . Smoking status: Current Every Day Smoker    Packs/day: 0.50    Years: 9.00    Types: Cigarettes  . Smokeless tobacco: Never Used  . Alcohol use Yes     Comment: socially    Review of Systems  Objective:   Vitals:   01/03/17 0816  BP: 114/72  Pulse: 99  Resp: 16  Temp:  98.1 F (36.7 C)  TempSrc: Oral  SpO2: 97%  Weight: 156 lb (70.8 kg)  Height: 5' 8.2" (1.732 m)   Body mass index is 23.58 kg/m. Wt Readings from Last 3 Encounters:  01/03/17 156 lb (70.8 kg)  12/15/16 161 lb 8 oz (73.3 kg)  10/15/15 164 lb (74.4 kg)   Physical Exam  Constitutional: He appears well-developed and well-nourished. No distress.  HENT:  Head: Normocephalic and atraumatic.  Nose: Nose normal.  Mouth/Throat: Oropharynx is clear and moist.  Eyes: EOM are normal. No scleral icterus.  Neck: No JVD present. No  thyromegaly present.  Cardiovascular: Normal rate, regular rhythm and normal heart sounds.   Pulmonary/Chest: Effort normal and breath sounds normal. No respiratory distress. He has no wheezes. He has no rales.  Abdominal: Soft. Bowel sounds are normal. He exhibits no distension. There is no tenderness. There is no guarding.  Musculoskeletal: Normal range of motion. He exhibits no edema.  Lymphadenopathy:    He has no cervical adenopathy.  Neurological: He is alert. He displays normal reflexes. He exhibits normal muscle tone. Coordination normal.  Skin: Skin is warm and dry. No rash noted. He is not diaphoretic. No erythema. No pallor.  Psychiatric: He has a normal mood and affect. His behavior is normal. Judgment and thought content normal.   Diabetic Foot Form - Detailed   Diabetic Foot Exam - detailed Diabetic Foot exam was performed with the following findings:  Yes 01/03/2017  8:39 AM  Visual Foot Exam completed.:  Yes  Is there a history of foot ulcer?:  No Are the toenails long?:  No Are the toenails thick?:  No Are the toenails ingrown?:  No Normal Range of Motion:  Yes Pulse Foot Exam completed.:  Yes  Right Dorsalis Pedis:  Present Left Dorsalis Pedis:  Present  Sensory Foot Exam Completed.:  Yes Swelling:  No Semmes-Weinstein Monofilament Test R Site 1-Great Toe:  Pos L Site 1-Great Toe:  Pos  R Site 4:  Pos L Site 4:  Pos  R Site 5:  Pos L Site 5:  Pos        Assessment/Plan:   Problem List Items Addressed This Visit      Other   Tobacco abuse    Urged patient to quit smoking; discussed the habit side to his addiction; I am here to help if/when he's ready to quit      Preventative health care - Primary    USPSTF grade A and B recommendations reviewed with patient; age-appropriate recommendations, preventive care, screening tests, etc discussed and encouraged; healthy living encouraged; see AVS for patient education given to patient          Meds ordered this  encounter  Medications  . dapagliflozin propanediol (FARXIGA) 10 MG TABS tablet    Sig: Take 10 mg by mouth daily.  . Insulin Glargine 300 UNIT/ML SOPN    Sig: Inject 20 Units into the skin daily.   No orders of the defined types were placed in this encounter.   Follow up plan: Return in about 1 year (around 01/03/2018) for complete physical.  An After Visit Summary was printed and given to the patient.

## 2017-01-03 NOTE — Assessment & Plan Note (Signed)
USPSTF grade A and B recommendations reviewed with patient; age-appropriate recommendations, preventive care, screening tests, etc discussed and encouraged; healthy living encouraged; see AVS for patient education given to patient  

## 2017-01-03 NOTE — Assessment & Plan Note (Signed)
Urged patient to quit smoking; discussed the habit side to his addiction; I am here to help if/when he's ready to quit

## 2017-01-13 DIAGNOSIS — E1142 Type 2 diabetes mellitus with diabetic polyneuropathy: Secondary | ICD-10-CM | POA: Diagnosis not present

## 2017-01-13 DIAGNOSIS — E1165 Type 2 diabetes mellitus with hyperglycemia: Secondary | ICD-10-CM | POA: Diagnosis not present

## 2017-01-13 DIAGNOSIS — F17200 Nicotine dependence, unspecified, uncomplicated: Secondary | ICD-10-CM | POA: Diagnosis not present

## 2017-01-13 DIAGNOSIS — I10 Essential (primary) hypertension: Secondary | ICD-10-CM | POA: Diagnosis not present

## 2017-01-31 ENCOUNTER — Other Ambulatory Visit: Payer: Self-pay

## 2017-01-31 DIAGNOSIS — I10 Essential (primary) hypertension: Secondary | ICD-10-CM

## 2017-01-31 MED ORDER — BENAZEPRIL-HYDROCHLOROTHIAZIDE 20-12.5 MG PO TABS: 1.0000 | ORAL_TABLET | Freq: Every day | ORAL | 0 refills | Status: DC

## 2017-01-31 NOTE — Telephone Encounter (Signed)
Needs mail order of 90 day supply.

## 2017-01-31 NOTE — Telephone Encounter (Signed)
Please remind patient that we had hoped to recheck his labs in the middle of February; please ask him to come in asap for the BMP I'll refill med

## 2017-03-09 IMAGING — CT CT ABD-PELV W/ CM
2 of 5 series · 16 of 46 positions shown, 18 images · IV contrast (omnipaque)
Comparison: None.

CLINICAL DATA: Abdominal pain, chronic and epigastric. Weight loss.
Blood in stool.

EXAM:
CT ABDOMEN AND PELVIS WITH CONTRAST
TECHNIQUE: Multidetector CT imaging of the abdomen and pelvis was performed
using the standard protocol following bolus administration of
intravenous contrast.
CONTRAST:  100mL OMNIPAQUE IOHEXOL 300 MG/ML  SOLN

[Series 2: routine with · axial · 0.66mm/px · z∈[-1067,-642]mm · 13 of 97 slices shown, 15 images]
[im 6/97  soft-tissue]
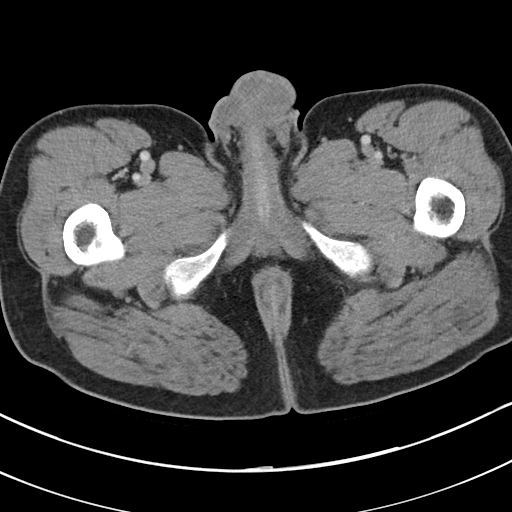
[im 6/97  bone]
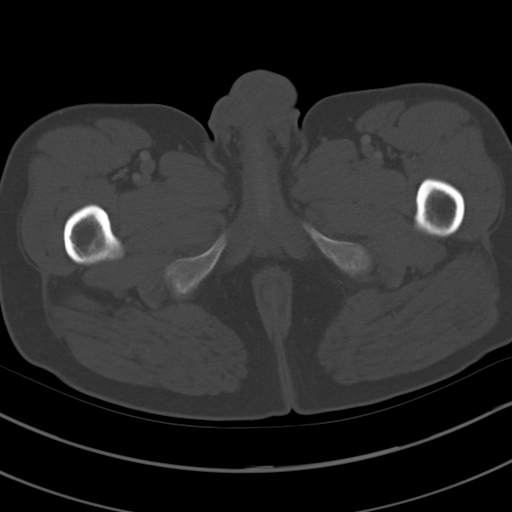
[im 11/97  soft-tissue]
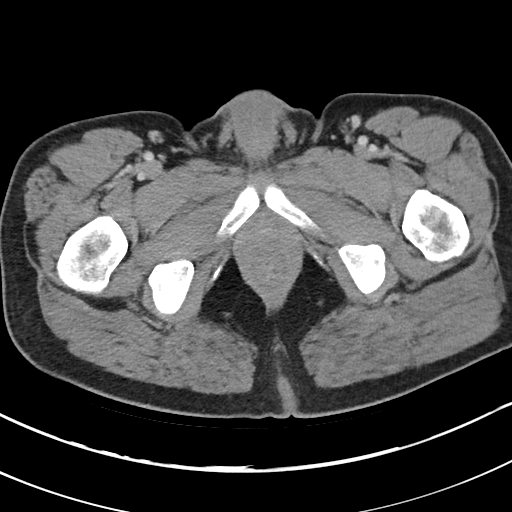
[im 22/97  soft-tissue]
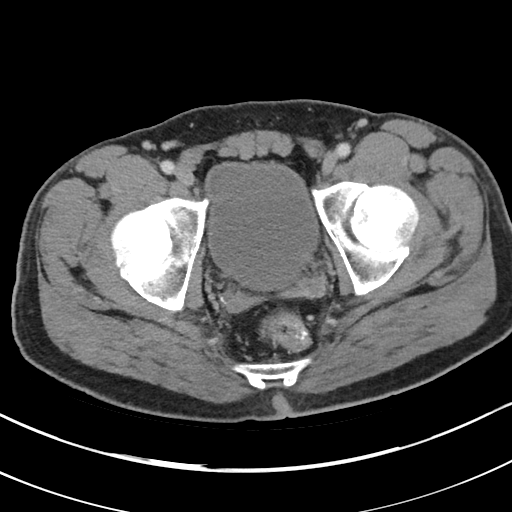
[im 27/97  soft-tissue]
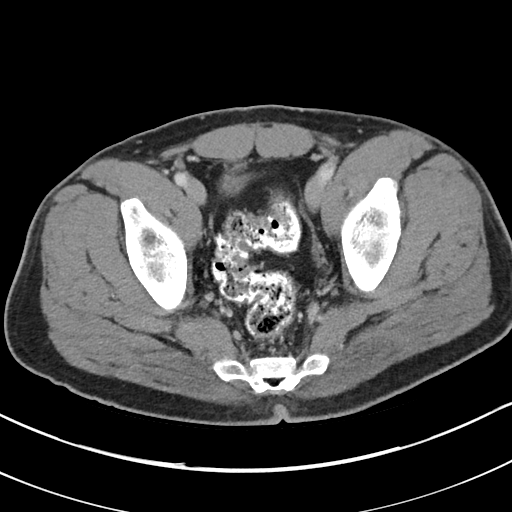
[im 33/97  soft-tissue]
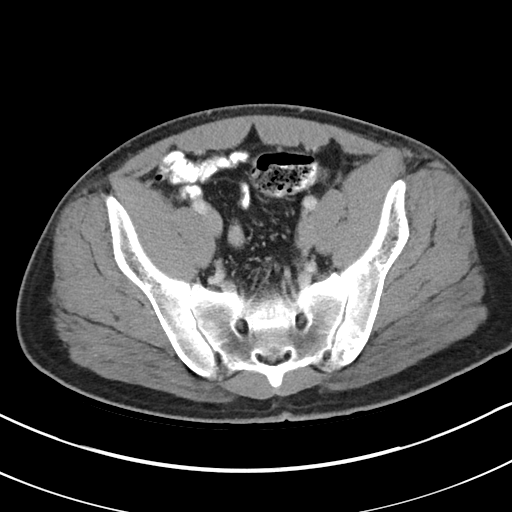
[im 43/97  soft-tissue]
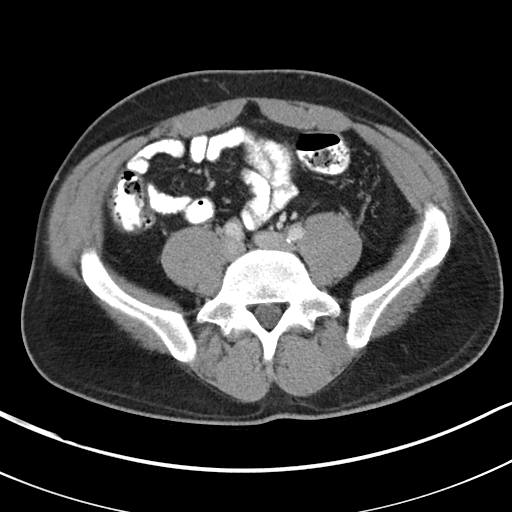
[im 49/97  soft-tissue]
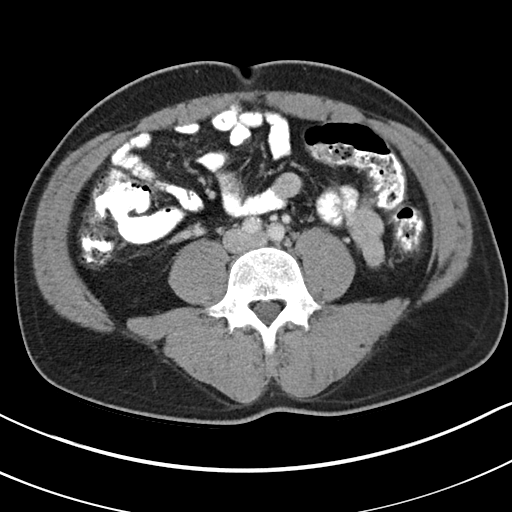
[im 54/97  soft-tissue]
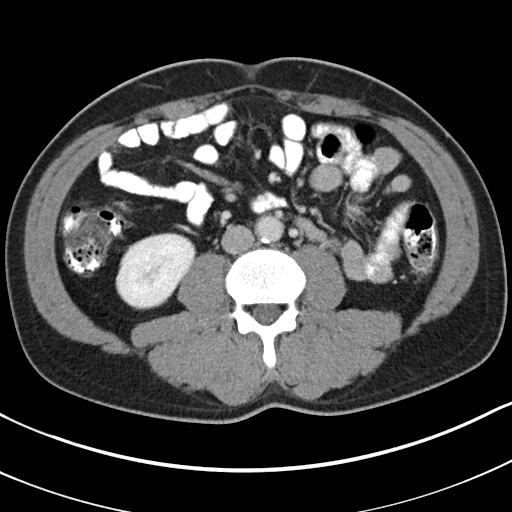
[im 65/97  soft-tissue]
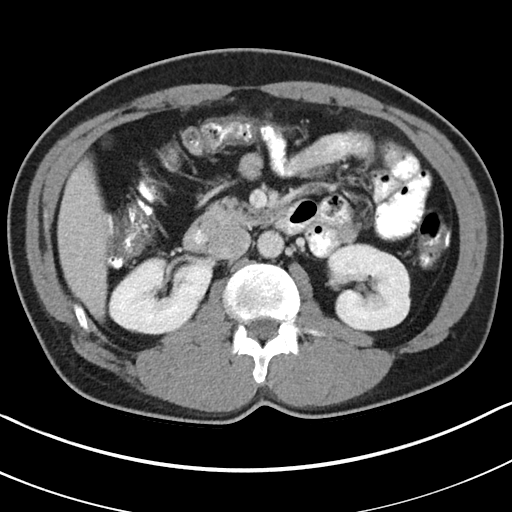
[im 65/97  bone]
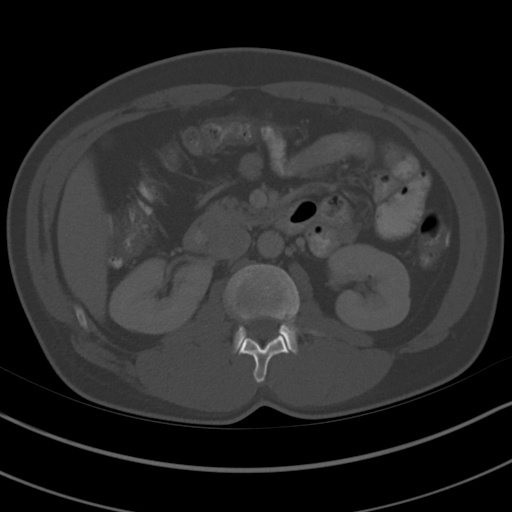
[im 70/97  soft-tissue]
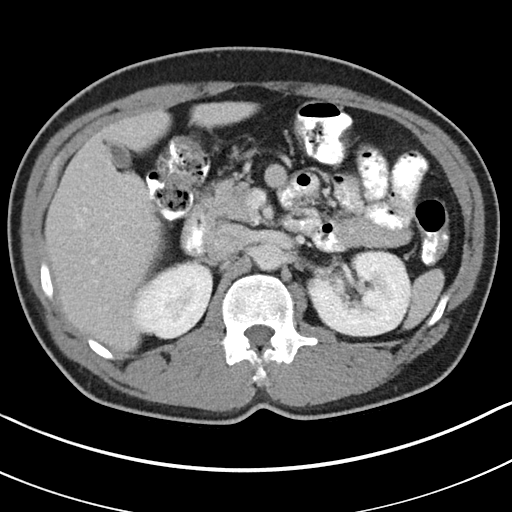
[im 75/97  soft-tissue]
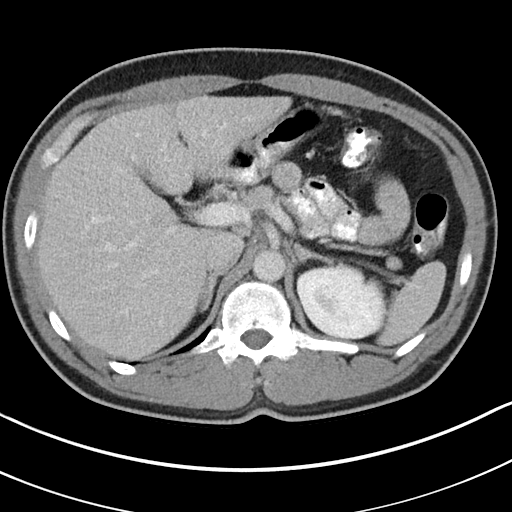
[im 86/97  soft-tissue]
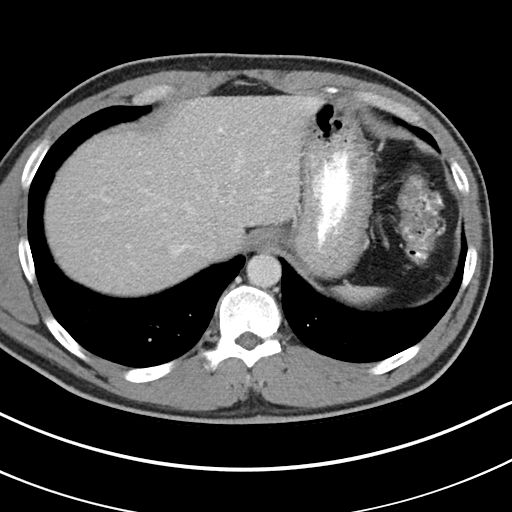
[im 91/97  soft-tissue]
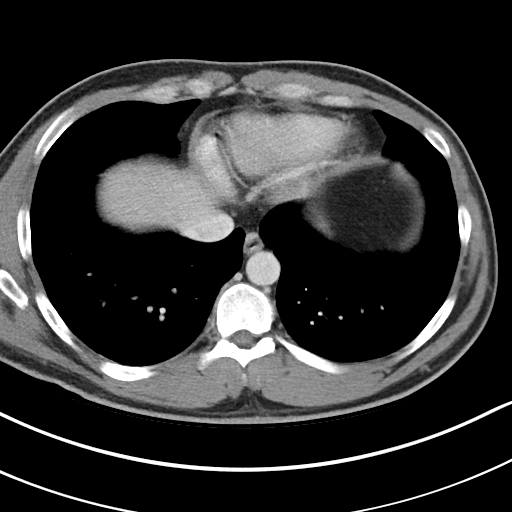

[Series 5: cor routine with · coronal · 0.68mm/px · 3 of 134 slices shown]
[im 45/134  soft-tissue]
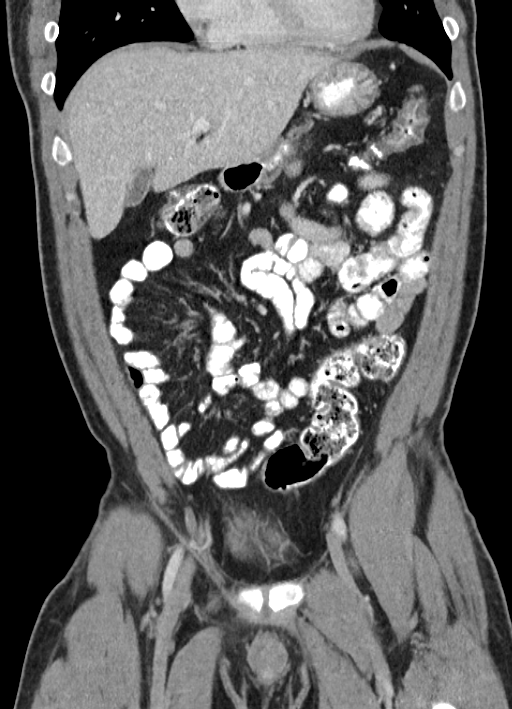
[im 60/134  soft-tissue]
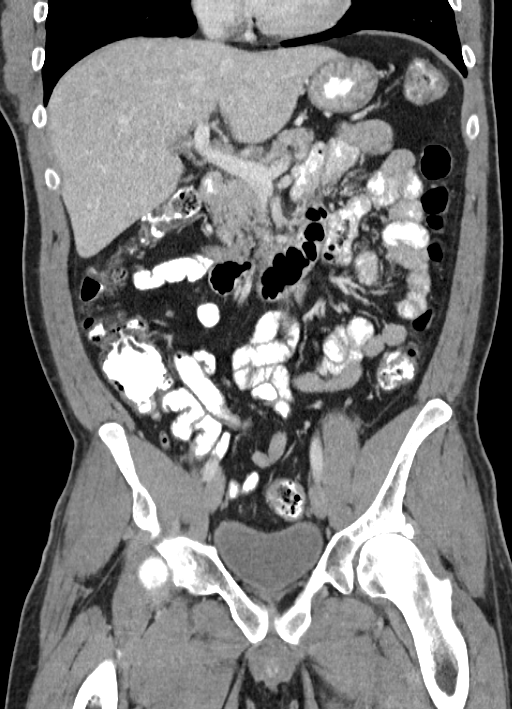
[im 74/134  soft-tissue]
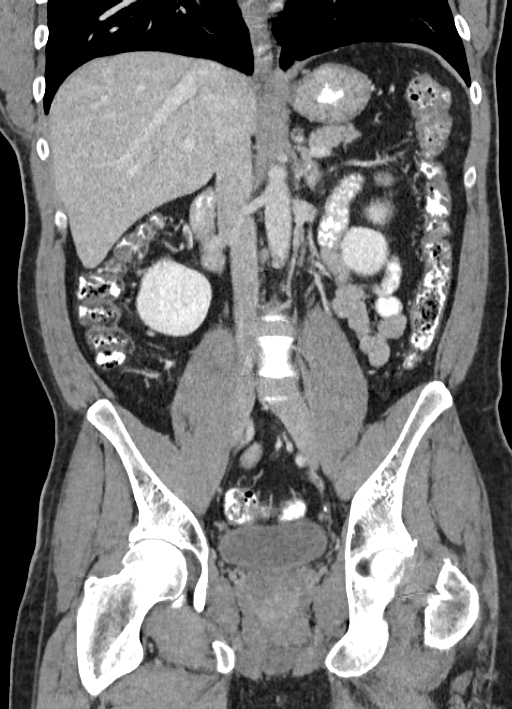

[16 of 46 positions shown; findings below may reference images not displayed]

FINDINGS: Lower chest and abdominal wall:  No contributory findings.

Hepatobiliary: No focal liver abnormality.No evidence of biliary
obstruction or stone.

Pancreas: Unremarkable.

Spleen: Unremarkable.

Adrenals/Urinary Tract: Negative adrenals. No hydronephrosis or
stone. Unremarkable bladder.

Reproductive:Early vas deferens calcification correlating with
diabetes history.

Stomach/Bowel: There is a short segment (roughly 3 cm long)
enteroenteric intussusception in the left upper quadrant. On coronal
reformats possible additional even shorter segment intussusception
on series 5, image 50. No lead point adenopathy or bowel wall mass
is identified. No associated obstruction. No indication of colitis.
No appendicitis.

Vascular/Lymphatic: Premature atherosclerosis and medium vessel
calcification. No acute arterial finding or visible flow limiting
stenosis. No mass or adenopathy.

Peritoneal: No ascites or pneumoperitoneum.

Musculoskeletal: No acute finding.  No sacroiliitis.
IMPRESSION: 1. One or 2 short-segment and nonobstructing enteroenteric
intussusceptions. These are often transient and incidental but given
the history note that there is association with celiac disease.
2. Premature atherosclerosis.

## 2017-03-17 ENCOUNTER — Emergency Department
Admission: EM | Admit: 2017-03-17 | Discharge: 2017-03-17 | Disposition: A | Discharge status: Home / Self Care | Payer: BLUE CROSS/BLUE SHIELD | Attending: Emergency Medicine | Admitting: Emergency Medicine

## 2017-03-17 ENCOUNTER — Encounter: Payer: Self-pay | Admitting: *Deleted

## 2017-03-17 ENCOUNTER — Emergency Department: Payer: BLUE CROSS/BLUE SHIELD

## 2017-03-17 DIAGNOSIS — E1165 Type 2 diabetes mellitus with hyperglycemia: Secondary | ICD-10-CM | POA: Insufficient documentation

## 2017-03-17 DIAGNOSIS — R202 Paresthesia of skin: Secondary | ICD-10-CM | POA: Diagnosis not present

## 2017-03-17 DIAGNOSIS — R079 Chest pain, unspecified: Secondary | ICD-10-CM | POA: Diagnosis not present

## 2017-03-17 DIAGNOSIS — Z79899 Other long term (current) drug therapy: Secondary | ICD-10-CM | POA: Insufficient documentation

## 2017-03-17 DIAGNOSIS — Z7982 Long term (current) use of aspirin: Secondary | ICD-10-CM | POA: Diagnosis not present

## 2017-03-17 DIAGNOSIS — Z794 Long term (current) use of insulin: Secondary | ICD-10-CM | POA: Insufficient documentation

## 2017-03-17 DIAGNOSIS — I10 Essential (primary) hypertension: Secondary | ICD-10-CM | POA: Insufficient documentation

## 2017-03-17 DIAGNOSIS — R200 Anesthesia of skin: Secondary | ICD-10-CM | POA: Diagnosis present

## 2017-03-17 DIAGNOSIS — F17210 Nicotine dependence, cigarettes, uncomplicated: Secondary | ICD-10-CM | POA: Diagnosis not present

## 2017-03-17 DIAGNOSIS — R739 Hyperglycemia, unspecified: Secondary | ICD-10-CM

## 2017-03-17 LAB — BASIC METABOLIC PANEL
Anion gap: 8 (ref 5–15)
BUN: 12 mg/dL (ref 6–20)
CALCIUM: 9 mg/dL (ref 8.9–10.3)
CO2: 28 mmol/L (ref 22–32)
CREATININE: 0.77 mg/dL (ref 0.61–1.24)
Chloride: 99 mmol/L — ABNORMAL LOW (ref 101–111)
GFR calc non Af Amer: >60 mL/min (ref >60)
Glucose, Bld: 345 mg/dL — ABNORMAL HIGH (ref 65–99)
Potassium: 3.3 mmol/L — ABNORMAL LOW (ref 3.5–5.1)
Sodium: 135 mmol/L (ref 135–145)

## 2017-03-17 LAB — CBC
HCT: 48.2 % (ref 40.0–52.0)
Hemoglobin: 16.5 g/dL (ref 13.0–18.0)
MCH: 29.8 pg (ref 26.0–34.0)
MCHC: 34.2 g/dL (ref 32.0–36.0)
MCV: 87.2 fL (ref 80.0–100.0)
PLATELETS: 339 10*3/uL (ref 150–440)
RBC: 5.53 MIL/uL (ref 4.40–5.90)
RDW: 13.8 % (ref 11.5–14.5)
WBC: 11.4 10*3/uL — AB (ref 3.8–10.6)

## 2017-03-17 LAB — GLUCOSE, CAPILLARY: GLUCOSE-CAPILLARY: 294 mg/dL — AB (ref 65–99)

## 2017-03-17 LAB — TROPONIN I

## 2017-03-17 MED ORDER — SODIUM CHLORIDE 0.9 % IV BOLUS (SEPSIS)
1000.0000 mL | Freq: Once | INTRAVENOUS | Status: AC
  Administered 2017-03-17: 1000 mL via INTRAVENOUS

## 2017-03-17 NOTE — ED Triage Notes (Signed)
Pt complains of hypertension today at work with chest discomfort, pt sent home from work, pt complains of back pain and left arm pain/numbness

## 2017-03-17 NOTE — ED Notes (Signed)
Patient up to the room commode with a steady gait.

## 2017-03-17 NOTE — ED Notes (Signed)
Pt alert and oriented X4, active, cooperative, pt in NAD. RR even and unlabored, color WNL.  Pt informed to return if any life threatening symptoms occur.   

## 2017-03-17 NOTE — ED Triage Notes (Signed)
Pt complains of symptoms for 1 week

## 2017-03-17 NOTE — Discharge Instructions (Signed)
Please seek medical attention for any high fevers, chest pain, shortness of breath, change in behavior, persistent vomiting, bloody stool or any other new or concerning symptoms.  

## 2017-03-17 NOTE — ED Provider Notes (Signed)
[facility] Emergency Department Provider Note   ____________________________________________   I have reviewed the triage vital signs and the nursing notes.   HISTORY  Chief Complaint Chest Pain and Numbness   History limited by: Not Limited   HPI Henry Fuller is a 41 y.o. male who presents to the emergency department today because of concerns for high blood pressure and tingling in change in sensation to his upper extremity is. The patient states this first happened roughly 5 days ago. At that time he was having tingling to his left arm. He states that he also had some discomfort in his chest. This then went away and then he did have one day with pain and tingling to his right arm. However today it was again in his left arm. When he went to work today this happened again and his blood pressure was checked. He states his blood pressure has been high for the past couple of days. Given the elevation of the blood. He came to the emergency department for evaluation.   Past Medical History:  Diagnosis Date  . Diabetes mellitus without complication ([place])    type 2  . Erectile dysfunction   . GERD (gastroesophageal reflux disease)   . Hyperlipidemia   . Hypertension    controlled on meds  . Hypogonadism in male   . Loose tooth due to trauma    front  . Vitamin D deficiency     Patient Active Problem List   Diagnosis Date Noted  . Preventative health care 01/03/2017  . Low back pain 12/15/2016  . Sciatica of right side 12/15/2016  . Gastric polyps   . Other diseases of stomach and duodenum   . 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 Loss of weight   . Atherosclerosis 08/28/2015  . Medication monitoring encounter 08/28/2015  . Unintended weight loss 08/20/2015  . Type 2 diabetes mellitus, uncontrolled ([place]) 08/20/2015  . Tobacco abuse 08/20/2015  . Essential hypertension, benign 08/20/2015  . Abdominal pain, chronic, epigastric 08/17/2015    Past  Surgical History:  Procedure Laterality Date  . COLONOSCOPY WITH PROPOFOL N/A 09/17/2015   Procedure: COLONOSCOPY WITH PROPOFOL;  Surgeon: [name], MD;  Location: [place];  Service: Endoscopy;  Laterality: N/A;  DIABETIC  . ESOPHAGOGASTRODUODENOSCOPY (EGD) WITH PROPOFOL N/A 09/17/2015   Procedure: ESOPHAGOGASTRODUODENOSCOPY (EGD) WITH PROPOFOL;  Surgeon: [name], MD;  Location: [place];  Service: Endoscopy;  Laterality: N/A;  . ESOPHAGOGASTRODUODENOSCOPY (EGD) WITH PROPOFOL N/A 10/05/2015   Procedure: ESOPHAGOGASTRODUODENOSCOPY (EGD) WITH PROPOFOL;  Surgeon: [name], MD;  Location: [place];  Service: Endoscopy;  Laterality: N/A;  . EUS N/A 10/15/2015   Procedure: UPPER ENDOSCOPIC ULTRASOUND (EUS) LINEAR;  Surgeon: [name], MD;  Location: [facility] END[facility];  Service: Gastroenterology;  Laterality: N/A;  . HERNIA REPAIR  0626   umbilical  . VASECTOMY  9485    Prior to Admission medications   Medication Sig Start Date End Date Taking? Authorizing Provider  aspirin EC 81 MG tablet Take 81 mg by mouth daily.    Historical Provider, MD  atorvastatin (LIPITOR) 10 MG tablet Take 1 tablet (10 mg total) by mouth at bedtime. 12/15/16   Arnetha Courser, MD  benazepril-hydrochlorthiazide (LOTENSIN HCT) 20-12.5 MG tablet Take 1 tablet by mouth daily. New strength 01/31/17   Arnetha Courser, MD  dapagliflozin propanediol (FARXIGA) 10 MG TABS tablet Take 10 mg by mouth daily. 12/15/16 12/15/17  Historical Provider, MD  fluticasone (FLONASE) 50 MCG/ACT  nasal spray Place 2 sprays into both nostrils as needed.     Historical Provider, MD  Insulin Glargine 300 UNIT/ML SOPN Inject 20 Units into the skin daily. 12/15/16   Historical Provider, MD    Allergies Atenolol  Family History  Problem Relation Age of Onset  . Diabetes Mother   . Hypertension Mother   . Cancer Father     colon cancer  . Diabetes Brother   . Hypertension Brother   . Diabetes Maternal  Grandmother   . Hypertension Maternal Grandmother   . Heart disease Maternal Grandmother   . Liver disease Maternal Grandfather   . Cancer Paternal Grandmother   . Stroke Paternal Grandfather   . Diabetes Brother   . Hypertension Brother     Social History Social History  Substance Use Topics  . Smoking status: Current Every Day Smoker    Packs/day: 0.50    Years: 9.00    Types: Cigarettes  . Smokeless tobacco: Never Used  . Alcohol use Yes     Comment: socially     Review of Systems Constitutional: No fever/chills Eyes: No visual changes. ENT: No sore throat. Cardiovascular: Positive for intermittent. Respiratory: Denies shortness of breath. Gastrointestinal: No abdominal pain.  No nausea, no vomiting.  No diarrhea.   Genitourinary: Negative for dysuria. Musculoskeletal: Left and right upper extremity change in sensation. Skin: Negative for rash. Neurological: Left shoulder tingling.  ____________________________________________   PHYSICAL EXAM:  VITAL SIGNS: ED Triage Vitals  Enc Vitals Group     BP 03/17/17 1138 137/86     Pulse Rate 03/17/17 1138 99     Resp 03/17/17 1138 20     Temp 03/17/17 1138 98.2 F (36.8 C)     Temp Source 03/17/17 1138 Oral     SpO2 03/17/17 1138 98 %     Weight 03/17/17 1138 160 lb (72.6 kg)     Height 03/17/17 1138 5\' 8"  (1.727 m)     Head Circumference --      Peak Flow --      Pain Score 03/17/17 1315 2   Constitutional: Alert and oriented. Well appearing and in no distress. Eyes: Conjunctivae are normal. Normal extraocular movements. ENT   Head: Normocephalic and atraumatic.   Nose: No congestion/rhinnorhea.   Mouth/Throat: Mucous membranes are moist.   Neck: No stridor. Hematological/Lymphatic/Immunilogical: No cervical lymphadenopathy. Cardiovascular: Normal rate, regular rhythm.  No murmurs, rubs, or gallops.  Respiratory: Normal respiratory effort without tachypnea nor retractions. Breath sounds are  clear and equal bilaterally. No wheezes/rales/rhonchi. Gastrointestinal: Soft and non tender. No rebound. No guarding.  Genitourinary: Deferred Musculoskeletal: Normal range of motion in all extremities. No lower extremity edema. Neurologic:  Normal speech and language. No gross focal neurologic deficits are appreciated.  Skin:  Skin is warm, dry and intact. No rash noted. Psychiatric: Mood and affect are normal. Speech and behavior are normal. Patient exhibits appropriate insight and judgment.  ____________________________________________    LABS (pertinent positives/negatives)  Labs Reviewed  BASIC METABOLIC PANEL - Abnormal; Notable for the following:       Result Value   Potassium 3.3 (*)    Chloride 99 (*)    Glucose, Bld 345 (*)    All other components within normal limits  CBC - Abnormal; Notable for the following:    WBC 11.4 (*)    All other components within normal limits  GLUCOSE, CAPILLARY - Abnormal; Notable for the following:    Glucose-Capillary 294 (*)    All  other components within normal limits  TROPONIN I     ____________________________________________   EKG  I, Nance Pear, attending physician, personally viewed and interpreted this EKG  EKG Time: 1132 Rate: 99 Rhythm: normal sinus rhythm Axis: normal Intervals: qtc 423 QRS: LVH ST changes: no st elevation Impression: abnormal ekg  ____________________________________________    RADIOLOGY  CXR IMPRESSION: No radiographic evidence of acute cardiopulmonary disease  ____________________________________________   PROCEDURES  Procedures  ____________________________________________   INITIAL IMPRESSION / ASSESSMENT AND PLAN / ED COURSE  Pertinent labs & imaging results that were available during my care of the patient were reviewed by me and considered in my medical decision making (see chart for details).  Patient presented to the emergency department today because of concerns  for some chest discomfort as well as some paresthesias. Blood sugar was found to be high. This did start to correct after IV fluids. Given negative workup. I feel that patient can be discharged home safely. Did discuss ports primary care follow-up.  ____________________________________________   FINAL CLINICAL IMPRESSION(S) / ED DIAGNOSES  Final diagnoses:  Paresthesia  Hyperglycemia     Note: This dictation was prepared with Dragon dictation. Any transcriptional errors that result from this process are unintentional     Nance Pear, MD 03/17/17 1521

## 2017-04-18 DIAGNOSIS — E1165 Type 2 diabetes mellitus with hyperglycemia: Secondary | ICD-10-CM | POA: Diagnosis not present

## 2017-04-18 DIAGNOSIS — E1142 Type 2 diabetes mellitus with diabetic polyneuropathy: Secondary | ICD-10-CM | POA: Diagnosis not present

## 2017-04-18 DIAGNOSIS — F17200 Nicotine dependence, unspecified, uncomplicated: Secondary | ICD-10-CM | POA: Diagnosis not present

## 2017-04-18 DIAGNOSIS — Z794 Long term (current) use of insulin: Secondary | ICD-10-CM | POA: Diagnosis not present

## 2017-04-18 DIAGNOSIS — E113293 Type 2 diabetes mellitus with mild nonproliferative diabetic retinopathy without macular edema, bilateral: Secondary | ICD-10-CM | POA: Diagnosis not present

## 2017-06-11 ENCOUNTER — Other Ambulatory Visit: Payer: Self-pay | Admitting: Family Medicine

## 2017-06-11 DIAGNOSIS — I10 Essential (primary) hypertension: Secondary | ICD-10-CM

## 2017-06-12 NOTE — Telephone Encounter (Signed)
Rx approved

## 2017-07-19 DIAGNOSIS — E113293 Type 2 diabetes mellitus with mild nonproliferative diabetic retinopathy without macular edema, bilateral: Secondary | ICD-10-CM | POA: Diagnosis not present

## 2017-07-19 LAB — HM DIABETES EYE EXAM

## 2017-09-11 ENCOUNTER — Ambulatory Visit: Payer: BLUE CROSS/BLUE SHIELD | Admitting: Family Medicine

## 2017-11-08 DIAGNOSIS — E1165 Type 2 diabetes mellitus with hyperglycemia: Secondary | ICD-10-CM | POA: Diagnosis not present

## 2017-11-08 DIAGNOSIS — E113293 Type 2 diabetes mellitus with mild nonproliferative diabetic retinopathy without macular edema, bilateral: Secondary | ICD-10-CM | POA: Diagnosis not present

## 2017-11-08 DIAGNOSIS — Z9114 Patient's other noncompliance with medication regimen: Secondary | ICD-10-CM | POA: Insufficient documentation

## 2017-11-08 DIAGNOSIS — E1142 Type 2 diabetes mellitus with diabetic polyneuropathy: Secondary | ICD-10-CM | POA: Diagnosis not present

## 2017-11-08 DIAGNOSIS — F17200 Nicotine dependence, unspecified, uncomplicated: Secondary | ICD-10-CM | POA: Diagnosis not present

## 2017-11-08 LAB — HEMOGLOBIN A1C: Hemoglobin A1C: 13

## 2017-12-06 DIAGNOSIS — E1142 Type 2 diabetes mellitus with diabetic polyneuropathy: Secondary | ICD-10-CM | POA: Diagnosis not present

## 2017-12-06 DIAGNOSIS — F17200 Nicotine dependence, unspecified, uncomplicated: Secondary | ICD-10-CM | POA: Diagnosis not present

## 2017-12-06 DIAGNOSIS — E113293 Type 2 diabetes mellitus with mild nonproliferative diabetic retinopathy without macular edema, bilateral: Secondary | ICD-10-CM | POA: Diagnosis not present

## 2017-12-06 DIAGNOSIS — E1165 Type 2 diabetes mellitus with hyperglycemia: Secondary | ICD-10-CM | POA: Diagnosis not present

## 2018-01-03 DIAGNOSIS — E1165 Type 2 diabetes mellitus with hyperglycemia: Secondary | ICD-10-CM | POA: Diagnosis not present

## 2018-01-03 DIAGNOSIS — Z794 Long term (current) use of insulin: Secondary | ICD-10-CM | POA: Diagnosis not present

## 2018-01-03 DIAGNOSIS — E113293 Type 2 diabetes mellitus with mild nonproliferative diabetic retinopathy without macular edema, bilateral: Secondary | ICD-10-CM | POA: Diagnosis not present

## 2018-01-03 DIAGNOSIS — E1142 Type 2 diabetes mellitus with diabetic polyneuropathy: Secondary | ICD-10-CM | POA: Diagnosis not present

## 2018-01-03 DIAGNOSIS — F17200 Nicotine dependence, unspecified, uncomplicated: Secondary | ICD-10-CM | POA: Diagnosis not present

## 2018-01-04 ENCOUNTER — Encounter: Payer: BLUE CROSS/BLUE SHIELD | Admitting: Family Medicine

## 2018-02-01 ENCOUNTER — Encounter: Payer: BLUE CROSS/BLUE SHIELD | Admitting: Family Medicine

## 2018-02-22 ENCOUNTER — Telehealth: Payer: Self-pay | Admitting: Family Medicine

## 2018-02-22 NOTE — Telephone Encounter (Signed)
Copied from [place] [id number]. Topic: Quick Communication - Rx Refill/Question >> Feb 22, 2018  1:58 PM Scherrie Gerlach wrote: Medication: benazepril-hydrochlorthiazide (LOTENSIN HCT) 20-12.5 MG tabl Has the patient contacted their pharmacy? yes wife states they have been waiting on the pharmacy to get this med in, and no one has and they do not know when they are going to get. Pt is out and needs an alternate  [place] (N),  - [place] [phone] (Phone) [phone] (Fax)

## 2018-02-22 NOTE — Telephone Encounter (Signed)
Please have him call his endocrinologist He has not been seen here in about 15 months We'll need to see him for refills and labs His endo doctor should be able to address if she wants him on this for renal protection

## 2018-02-23 NOTE — Telephone Encounter (Signed)
Called pt's number no answer. Unable to leave message as no voicemail is set up. Called pt's emergency contact, no answer. LM for pt informing him of the need to get up an appt asap, as well as call endo for appt.

## 2018-02-23 NOTE — Telephone Encounter (Signed)
He needs to get medicine for diabetic kidney protection from endo please He's non-compliant here, overdue for visit and labs

## 2018-02-23 NOTE — Telephone Encounter (Signed)
Pt. Has appt. 5/8  But pharmacy is saying they can not get this medication anymore and wants to know what he needs to do until appt.   He is going [place] [facility] for Endo

## 2018-02-23 NOTE — Telephone Encounter (Signed)
I prescribed him 6 months of medicine in July, so he would have run out of this medicine in January anyway Patient canceled his appt with me in October, February, and March

## 2018-03-23 ENCOUNTER — Encounter: Payer: BLUE CROSS/BLUE SHIELD | Admitting: Family Medicine

## 2018-05-02 ENCOUNTER — Ambulatory Visit (INDEPENDENT_AMBULATORY_CARE_PROVIDER_SITE_OTHER): Payer: BLUE CROSS/BLUE SHIELD | Admitting: Family Medicine

## 2018-05-02 ENCOUNTER — Encounter: Payer: Self-pay | Admitting: Family Medicine

## 2018-05-02 VITALS — BP 122/72 | HR 98 | Temp 98.3°F | Resp 14 | Ht 67.75 in | Wt 175.1 lb

## 2018-05-02 DIAGNOSIS — J309 Allergic rhinitis, unspecified: Secondary | ICD-10-CM | POA: Insufficient documentation

## 2018-05-02 DIAGNOSIS — Z720 Tobacco use: Secondary | ICD-10-CM

## 2018-05-02 DIAGNOSIS — I7090 Unspecified atherosclerosis: Secondary | ICD-10-CM

## 2018-05-02 DIAGNOSIS — Z0000 Encounter for general adult medical examination without abnormal findings: Secondary | ICD-10-CM

## 2018-05-02 DIAGNOSIS — E1165 Type 2 diabetes mellitus with hyperglycemia: Secondary | ICD-10-CM | POA: Diagnosis not present

## 2018-05-02 DIAGNOSIS — Z113 Encounter for screening for infections with a predominantly sexual mode of transmission: Secondary | ICD-10-CM | POA: Diagnosis not present

## 2018-05-02 DIAGNOSIS — J301 Allergic rhinitis due to pollen: Secondary | ICD-10-CM | POA: Diagnosis not present

## 2018-05-02 DIAGNOSIS — I10 Essential (primary) hypertension: Secondary | ICD-10-CM | POA: Diagnosis not present

## 2018-05-02 DIAGNOSIS — Z0001 Encounter for general adult medical examination with abnormal findings: Secondary | ICD-10-CM | POA: Diagnosis not present

## 2018-05-02 MED ORDER — FLUTICASONE PROPIONATE 50 MCG/ACT NA SUSP: 2.0000 | Freq: Every day | NASAL | 11 refills | Status: DC | PRN

## 2018-05-02 NOTE — Assessment & Plan Note (Signed)
Refill given of nasal corticosteroid

## 2018-05-02 NOTE — Assessment & Plan Note (Signed)
USPSTF grade A and B recommendations reviewed with patient; age-appropriate recommendations, preventive care, screening tests, etc discussed and encouraged; healthy living encouraged; see AVS for patient education given to patient  

## 2018-05-02 NOTE — Assessment & Plan Note (Signed)
Encouragement given; discussed options including Chantix, nicotine replacements; he decided to try nicotine replacement; 800-QUIT-NOW number given and he may qualify for vouchers for replacement product(s); he is welcome to call back for Rx for Chantix if this attempt is unsuccessful; see AVS for other ideas

## 2018-05-02 NOTE — Patient Instructions (Addendum)
I do recommend yearly flu shots; for individuals who don't want flu shots, try to practice excellent hand hygiene, and avoid nursing homes, day cares, and hospitals during peak flu season; taking additional vitamin C daily during flu/cold season may help boost your immune system too  I do encourage you to quit smoking Call [phone] to sign up for smoking cessation classes You can call 1-800-QUIT-NOW to talk with a smoking cessation coach   Steps to Quit Smoking Smoking tobacco can be bad for your health. It can also affect almost every organ in your body. Smoking puts you and people around you at risk for many serious long-lasting (chronic) diseases. Quitting smoking is hard, but it is one of the best things that you can do for your health. It is never too late to quit. What are the benefits of quitting smoking? When you quit smoking, you lower your risk for getting serious diseases and conditions. They can include:  Lung cancer or lung disease.  Heart disease.  Stroke.  Heart attack.  Not being able to have children (infertility).  Weak bones (osteoporosis) and broken bones (fractures).  If you have coughing, wheezing, and shortness of breath, those symptoms may get better when you quit. You may also get sick less often. If you are pregnant, quitting smoking can help to lower your chances of having a baby of low birth weight. What can I do to help me quit smoking? Talk with your doctor about what can help you quit smoking. Some things you can do (strategies) include:  Quitting smoking totally, instead of slowly cutting back how much you smoke over a period of time.  Going to in-person counseling. You are more likely to quit if you go to many counseling sessions.  Using resources and support systems, such as: ? Database administrator with a Social worker. ? Phone quitlines. ? Careers information officer. ? Support groups or group counseling. ? Text messaging programs. ? Mobile phone apps  or applications.  Taking medicines. Some of these medicines may have nicotine in them. If you are pregnant or breastfeeding, do not take any medicines to quit smoking unless your doctor says it is okay. Talk with your doctor about counseling or other things that can help you.  Talk with your doctor about using more than one strategy at the same time, such as taking medicines while you are also going to in-person counseling. This can help make quitting easier. What things can I do to make it easier to quit? Quitting smoking might feel very hard at first, but there is a lot that you can do to make it easier. Take these steps:  Talk to your family and friends. Ask them to support and encourage you.  Call phone quitlines, reach out to support groups, or work with a Social worker.  Ask people who smoke to not smoke around you.  Avoid places that make you want (trigger) to smoke, such as: ? Bars. ? Parties. ? Smoke-break areas at work.  Spend time with people who do not smoke.  Lower the stress in your life. Stress can make you want to smoke. Try these things to help your stress: ? Getting regular exercise. ? Deep-breathing exercises. ? Yoga. ? Meditating. ? Doing a body scan. To do this, close your eyes, focus on one area of your body at a time from head to toe, and notice which parts of your body are tense. Try to relax the muscles in those areas.  Download or buy apps  on your mobile phone or tablet that can help you stick to your quit plan. There are many free apps, such as QuitGuide from the [employer] Office manager for Disease Control and Prevention). You can find more support from smokefree.gov and other websites.  This information is not intended to replace advice given to you by your health care provider. Make sure you discuss any questions you have with your health care provider. Document Released: 08/27/2009 Document Revised: 06/28/2016 Document Reviewed: 03/17/2015 Elsevier Interactive Patient  Education  2018 [place] Maintenance, Male A healthy lifestyle and preventive care is important for your health and wellness. Ask your health care provider about what schedule of regular examinations is right for you. What should I know about weight and diet? Eat a Healthy Diet  Eat plenty of vegetables, fruits, whole grains, low-fat dairy products, and lean protein.  Do not eat a lot of foods high in solid fats, added sugars, or salt.  Maintain a Healthy Weight Regular exercise can help you achieve or maintain a healthy weight. You should:  Do at least 150 minutes of exercise each week. The exercise should increase your heart rate and make you sweat (moderate-intensity exercise).  Do strength-training exercises at least twice a week.  Watch Your Levels of Cholesterol and Blood Lipids  Have your blood tested for lipids and cholesterol every 5 years starting at 43 years of age. If you are at high risk for heart disease, you should start having your blood tested when you are 43 years old. You may need to have your cholesterol levels checked more often if: ? Your lipid or cholesterol levels are high. ? You are older than 43 years of age. ? You are at high risk for heart disease.  What should I know about cancer screening? Many types of cancers can be detected early and may often be prevented. Lung Cancer  You should be screened every year for lung cancer if: ? You are a current smoker who has smoked for at least 30 years. ? You are a former smoker who has quit within the past 15 years.  Talk to your health care provider about your screening options, when you should start screening, and how often you should be screened.  Colorectal Cancer  Routine colorectal cancer screening usually begins at 43 years of age and should be repeated every 5-10 years until you are 43 years old. You may need to be screened more often if early forms of precancerous polyps or small growths  are found. Your health care provider may recommend screening at an earlier age if you have risk factors for colon cancer.  Your health care provider may recommend using home test kits to check for hidden blood in the stool.  A small camera at the end of a tube can be used to examine your colon (sigmoidoscopy or colonoscopy). This checks for the earliest forms of colorectal cancer.  Prostate and Testicular Cancer  Depending on your age and overall health, your health care provider may do certain tests to screen for prostate and testicular cancer.  Talk to your health care provider about any symptoms or concerns you have about testicular or prostate cancer.  Skin Cancer  Check your skin from head to toe regularly.  Tell your health care provider about any new moles or changes in moles, especially if: ? There is a change in a mole's size, shape, or color. ? You have a mole that is larger than a pencil eraser.  Always use sunscreen. Apply sunscreen liberally and repeat throughout the day.  Protect yourself by wearing long sleeves, pants, a wide-brimmed hat, and sunglasses when outside.  What should I know about heart disease, diabetes, and high blood pressure?  If you are 37-70 years of age, have your blood pressure checked every 3-5 years. If you are 70 years of age or older, have your blood pressure checked every year. You should have your blood pressure measured twice-once when you are at a hospital or clinic, and once when you are not at a hospital or clinic. Record the average of the two measurements. To check your blood pressure when you are not at a hospital or clinic, you can use: ? An automated blood pressure machine at a pharmacy. ? A home blood pressure monitor.  Talk to your health care provider about your target blood pressure.  If you are between 86-101 years old, ask your health care provider if you should take aspirin to prevent heart disease.  Have regular diabetes  screenings by checking your fasting blood sugar level. ? If you are at a normal weight and have a low risk for diabetes, have this test once every three years after the age of 6. ? If you are overweight and have a high risk for diabetes, consider being tested at a younger age or more often.  A one-time screening for abdominal aortic aneurysm (AAA) by ultrasound is recommended for men aged 2-75 years who are current or former smokers. What should I know about preventing infection? Hepatitis B If you have a higher risk for hepatitis B, you should be screened for this virus. Talk with your health care provider to find out if you are at risk for hepatitis B infection. Hepatitis C Blood testing is recommended for:  Everyone born from [birth year] through 1966.  Anyone with known risk factors for hepatitis C.  Sexually Transmitted Diseases (STDs)  You should be screened each year for STDs including gonorrhea and chlamydia if: ? You are sexually active and are younger than 43 years of age. ? You are older than 43 years of age and your health care provider tells you that you are at risk for this type of infection. ? Your sexual activity has changed since you were last screened and you are at an increased risk for chlamydia or gonorrhea. Ask your health care provider if you are at risk.  Talk with your health care provider about whether you are at high risk of being infected with HIV. Your health care provider may recommend a prescription medicine to help prevent HIV infection.  What else can I do?  Schedule regular health, dental, and eye exams.  Stay current with your vaccines (immunizations).  Do not use any tobacco products, such as cigarettes, chewing tobacco, and e-cigarettes. If you need help quitting, ask your health care provider.  Limit alcohol intake to no more than 2 drinks per day. One drink equals 12 ounces of beer, 5 ounces of wine, or 1 ounces of hard liquor.  Do not use street  drugs.  Do not share needles.  Ask your health care provider for help if you need support or information about quitting drugs.  Tell your health care provider if you often feel depressed.  Tell your health care provider if you have ever been abused or do not feel safe at home. This information is not intended to replace advice given to you by your health care provider. Make sure you discuss any  questions you have with your health care provider. Document Released: 04/28/2008 Document Revised: 06/29/2016 Document Reviewed: 08/04/2015 Elsevier Interactive Patient Education  [employer].

## 2018-05-02 NOTE — Progress Notes (Signed)
Patient ID: Henry Fuller, male   DOB: [birth year], 42 y.o.   MRN: [id number]   Subjective:   Henry Fuller is a 42 y.o. male here for a complete physical exam  Interim issues since last visit: he has been doing well; interested in quitting smoking He sees Dr. Gabriel Carina for diabetes; last A1c uncontrolled; he is overdue to go back; I understand she is leaving the area; he tries to eat well, but work presents an obstacle He has some right sided hip pain; allergies, needs refill of nasal spray Hx of duodenal polyps; saw GI; note to him to see about any f/u colonoscopy since he reports his father had it early High cholesterol, atherosclerosis (premature); patient is not taking statin; he did not have a bad reaction; just didn't get refilled he thinks; no problems with it  USPSTF grade A and B recommendations Depression:  Depression screen [facility] 2/9 05/02/2018 01/03/2017 12/15/2016  Decreased Interest 0 0 0  Down, Depressed, Hopeless 0 0 0  PHQ - 2 Score 0 0 0   Hypertension: well-controlled on meds BP Readings from Last 3 Encounters:  05/02/18 122/72  03/17/17 127/86  01/03/17 114/72   Obesity: Wt Readings from Last 3 Encounters:  05/02/18 175 lb 1.6 oz (79.4 kg)  03/17/17 160 lb (72.6 kg)  01/03/17 156 lb (70.8 kg)   BMI Readings from Last 3 Encounters:  05/02/18 26.82 kg/m  03/17/17 24.33 kg/m  01/03/17 23.58 kg/m    Immunizations: pneumococcal vaccine UTD; tetanus UTD; does not get flu shots (made him sick once) Skin cancer: no worrisome moles Lung cancer:  Smoker, trying to quit; wants to try to quit; tried patches Prostate cancer: no trouble urinating No results found for: PSA Colorectal cancer: has had colonoscopy x 2; father had colon cancer; last colonoscopy Nov 2016; next due 3 vs 5 years AAA: n/a Aspirin: n/a Diet: trying to watch a good diet; eats once a day at most, not three meals a day; not home for breakfast; does not want to eat the same stuff in the  area where he works; eats dinner at home; not enough fruits and veggies Exercise: does a lot of walking, 12-13k steps a day at work Alcohol: maybe 1 weekend, 3-4 at a time Tobacco use: working on cessation HIV, hep B, hep C: check STD testing and prevention (chl/gon/syphilis): check Glucose:  Glucose, Bld  Date Value Ref Range Status  03/17/2017 345 (H) 65 - 99 mg/dL Final  12/15/2016 344 (H) 65 - 99 mg/dL Final  08/29/2015 294 (H) 65 - 99 mg/dL Final   Glucose-Capillary  Date Value Ref Range Status  03/17/2017 294 (H) 65 - 99 mg/dL Final  10/15/2015 314 (H) 65 - 99 mg/dL Final  10/05/2015 257 (H) 65 - 99 mg/dL Final   Lipids:  Lab Results  Component Value Date   CHOL 159 12/15/2016   CHOL 170 12/26/2014   CHOL 171 11/21/2013   Lab Results  Component Value Date   HDL 31 (L) 12/15/2016   HDL 29 (A) 12/26/2014   HDL 26 (A) 11/21/2013   Lab Results  Component Value Date   LDLCALC 103 (H) 12/15/2016   LDLCALC 73 12/26/2014   LDLCALC 90 11/21/2013   Lab Results  Component Value Date   TRIG 123 12/15/2016   TRIG 340 (A) 12/26/2014   TRIG 275 (A) 11/21/2013   Lab Results  Component Value Date   CHOLHDL 5.1 (H) 12/15/2016   No results found for: LDLDIRECT  Past Medical History:  Diagnosis Date  . Diabetes mellitus without complication ([place])    type 2  . Erectile dysfunction   . GERD (gastroesophageal reflux disease)   . Hyperlipidemia   . Hypertension    controlled on meds  . Hypogonadism in male   . Loose tooth due to trauma    front  . Vitamin D deficiency    Past Surgical History:  Procedure Laterality Date  . COLONOSCOPY WITH PROPOFOL N/A 09/17/2015   Procedure: COLONOSCOPY WITH PROPOFOL;  Surgeon: [name], MD;  Location: [place];  Service: Endoscopy;  Laterality: N/A;  DIABETIC  . ESOPHAGOGASTRODUODENOSCOPY (EGD) WITH PROPOFOL N/A 09/17/2015   Procedure: ESOPHAGOGASTRODUODENOSCOPY (EGD) WITH PROPOFOL;  Surgeon: [name], MD;   Location: [place];  Service: Endoscopy;  Laterality: N/A;  . ESOPHAGOGASTRODUODENOSCOPY (EGD) WITH PROPOFOL N/A 10/05/2015   Procedure: ESOPHAGOGASTRODUODENOSCOPY (EGD) WITH PROPOFOL;  Surgeon: [name], MD;  Location: [place];  Service: Endoscopy;  Laterality: N/A;  . EUS N/A 10/15/2015   Procedure: UPPER ENDOSCOPIC ULTRASOUND (EUS) LINEAR;  Surgeon: [name], MD;  Location: [facility];  Service: Gastroenterology;  Laterality: N/A;  . HERNIA REPAIR  6712   umbilical  . VASECTOMY  4580   Family History  Problem Relation Age of Onset  . Diabetes Mother   . Hypertension Mother   . Cancer Father        colon cancer  . Diabetes Brother   . Hypertension Brother   . Diabetes Maternal Grandmother   . Hypertension Maternal Grandmother   . Heart disease Maternal Grandmother   . Liver disease Maternal Grandfather   . Cancer Paternal Grandmother   . Stroke Paternal Grandfather   . Diabetes Brother   . Hypertension Brother    Social History   Tobacco Use  . Smoking status: Current Every Day Smoker    Packs/day: 0.50    Years: 9.00    Pack years: 4.50    Types: Cigarettes  . Smokeless tobacco: Never Used  Substance Use Topics  . Alcohol use: Yes    Comment: socially   . Drug use: No   Review of Systems  HENT: Positive for rhinorrhea (he'd like a refill of nasal corticosteroid).     Objective:   Vitals:   05/02/18 1117  BP: 122/72  Pulse: 98  Resp: 14  Temp: 98.3 F (36.8 C)  TempSrc: Oral  SpO2: 97%  Weight: 175 lb 1.6 oz (79.4 kg)  Height: 5' 7.75" (1.721 m)   Body mass index is 26.82 kg/m. Wt Readings from Last 3 Encounters:  05/02/18 175 lb 1.6 oz (79.4 kg)  03/17/17 160 lb (72.6 kg)  01/03/17 156 lb (70.8 kg)   Physical Exam  Constitutional: He appears well-developed and well-nourished. No distress.  HENT:  Head: Normocephalic and atraumatic.  Nose: Nose normal.  Mouth/Throat: Oropharynx is clear and moist.  Eyes:  EOM are normal. No scleral icterus.  Neck: No JVD present. No thyromegaly present.  Cardiovascular: Normal rate, regular rhythm and normal heart sounds.  Pulmonary/Chest: Effort normal and breath sounds normal. No respiratory distress. He has no wheezes. He has no rales.  Abdominal: Soft. Bowel sounds are normal. He exhibits no distension. There is no tenderness. There is no guarding.  Musculoskeletal: Normal range of motion. He exhibits no edema.  Lymphadenopathy:    He has no cervical adenopathy.  Neurological: He is alert. He displays normal reflexes. He exhibits normal muscle tone. Coordination normal.  Skin: Skin is  warm and dry. No rash noted. He is not diaphoretic. No erythema. No pallor.  Psychiatric: He has a normal mood and affect. His behavior is normal. Judgment and thought content normal.   Diabetic Foot Form - Detailed   Diabetic Foot Exam - detailed Diabetic Foot exam was performed with the following findings:  Yes 05/02/2018 11:37 AM  Visual Foot Exam completed.:  Yes  Pulse Foot Exam completed.:  Yes  Right Dorsalis Pedis:  Present Left Dorsalis Pedis:  Present  Sensory Foot Exam Completed.:  Yes Semmes-Weinstein Monofilament Test R Site 1-Great Toe:  Pos L Site 1-Great Toe:  Pos         Assessment/Plan:   Problem List Items Addressed This Visit      Cardiovascular and Mediastinum   Essential hypertension, benign (Chronic)    Controlled today; continue ARB/thiazide combo      Atherosclerosis (Chronic)    Goal LDL less than 70; check lipids today; will decide on dose of statin once labs are back; smoking cessation and diabetes control are key        Respiratory   Allergic rhinitis (Chronic)    Refill given of nasal corticosteroid        Endocrine   Type 2 diabetes mellitus, uncontrolled (HCC) (Chronic)    Managed primarily by endocrinologist, but patient is overdue for A1c and urine microalbumin; check today and forward results to her      Relevant  Medications   insulin aspart (NOVOLOG FLEXPEN) 100 UNIT/ML FlexPen   Other Relevant Orders   Microalbumin / creatinine urine ratio   Hemoglobin A1c     Other   Tobacco abuse (Chronic)    Encouragement given; discussed options including Chantix, nicotine replacements; he decided to try nicotine replacement; 800-QUIT-NOW number given and he may qualify for vouchers for replacement product(s); he is welcome to call back for Rx for Chantix if this attempt is unsuccessful; see AVS for other ideas      Preventative health care - Primary    USPSTF grade A and B recommendations reviewed with patient; age-appropriate recommendations, preventive care, screening tests, etc discussed and encouraged; healthy living encouraged; see AVS for patient education given to patient       Relevant Orders   CBC with Differential/Platelet   COMPLETE METABOLIC PANEL WITH GFR   Lipid panel   TSH    Other Visit Diagnoses    Screen for STD (sexually transmitted disease)       Relevant Orders   C. trachomatis/N. gonorrhoeae RNA   Hepatitis panel, acute   HIV antibody   RPR   Trichomonas vaginalis RNA, Ql,Males      Meds ordered this encounter  Medications  . fluticasone (FLONASE) 50 MCG/ACT nasal spray    Sig: Place 2 sprays into both nostrils daily as needed.    Dispense:  16 g    Refill:  11   Orders Placed This Encounter  Procedures  . C. trachomatis/N. gonorrhoeae RNA  . CBC with Differential/Platelet  . COMPLETE METABOLIC PANEL WITH GFR  . Lipid panel  . TSH  . Microalbumin / creatinine urine ratio  . Hemoglobin A1c  . Hepatitis panel, acute  . HIV antibody  . RPR   Greater than 3 minutes spent counseling on smoking cessation  Follow up plan: Return in about 1 year (around 05/03/2019) for complete physical.  An After Visit Summary was printed and given to the patient.

## 2018-05-02 NOTE — Assessment & Plan Note (Signed)
Controlled today; continue ARB/thiazide combo

## 2018-05-02 NOTE — Assessment & Plan Note (Signed)
Managed primarily by endocrinologist, but patient is overdue for A1c and urine microalbumin; check today and forward results to her

## 2018-05-02 NOTE — Assessment & Plan Note (Addendum)
Goal LDL less than 70; check lipids today; will decide on dose of statin once labs are back; smoking cessation and diabetes control are key

## 2018-05-03 ENCOUNTER — Other Ambulatory Visit: Payer: Self-pay | Admitting: Family Medicine

## 2018-05-03 MED ORDER — ATORVASTATIN CALCIUM 10 MG PO TABS: 10.0000 mg | ORAL_TABLET | Freq: Every day | ORAL | 5 refills | Status: DC

## 2018-05-03 NOTE — Progress Notes (Signed)
Atorvastatin 10

## 2018-05-07 ENCOUNTER — Telehealth: Payer: Self-pay | Admitting: Family Medicine

## 2018-05-07 NOTE — Telephone Encounter (Signed)
-----   Message from [name], MD sent at 05/07/2018  8:50 AM EDT ----- Regarding: RE: next colonoscopy due... ? Hi, Five years for a family history.  Darren ----- Message ----- From: Arnetha Courser, MD Sent: 05/02/2018  11:34 AM To: [name], MD Subject: next colonoscopy due... ?                      Hi! I'm seeing Mr. Scantlebury today. His father had colon cancer. You did his colonoscopy in 2016. When does he need to have the next one? Thank you

## 2018-05-07 NOTE — Telephone Encounter (Signed)
Please let patient know that his next colonoscopy will be due five years after the last one Thank you

## 2018-05-07 NOTE — Telephone Encounter (Signed)
Left detailed voicemail

## 2018-05-25 ENCOUNTER — Encounter: Payer: Self-pay | Admitting: Family Medicine

## 2018-05-25 DIAGNOSIS — E11319 Type 2 diabetes mellitus with unspecified diabetic retinopathy without macular edema: Secondary | ICD-10-CM

## 2018-05-25 HISTORY — DX: Type 2 diabetes mellitus with unspecified diabetic retinopathy without macular edema: E11.319

## 2018-06-21 ENCOUNTER — Other Ambulatory Visit: Payer: Self-pay | Admitting: Family Medicine

## 2018-06-22 NOTE — Telephone Encounter (Signed)
Last Cr and K+ reviewed; Rx approved 

## 2018-08-14 LAB — LIPID PANEL
CHOL/HDL RATIO: 5.2 (calc) — AB (ref <5.0)
Cholesterol: 165 mg/dL (ref <200)
HDL: 32 mg/dL — AB (ref >40)
LDL Cholesterol (Calc): 108 mg/dL (calc) — ABNORMAL HIGH
NON-HDL CHOLESTEROL (CALC): 133 mg/dL — AB (ref <130)
Triglycerides: 139 mg/dL (ref <150)

## 2018-08-14 LAB — CBC WITH DIFFERENTIAL/PLATELET
BASOS ABS: 58 {cells}/uL (ref 0–200)
Basophils Relative: 0.6 %
Eosinophils Absolute: 240 cells/uL (ref 15–500)
Eosinophils Relative: 2.5 %
HEMATOCRIT: 48.8 % (ref 38.5–50.0)
HEMOGLOBIN: 16.8 g/dL (ref 13.2–17.1)
LYMPHS ABS: 2966 {cells}/uL (ref 850–3900)
MCH: 29.5 pg (ref 27.0–33.0)
MCHC: 34.4 g/dL (ref 32.0–36.0)
MCV: 85.6 fL (ref 80.0–100.0)
MPV: 10.3 fL (ref 7.5–12.5)
Monocytes Relative: 6.7 %
NEUTROS ABS: 5693 {cells}/uL (ref 1500–7800)
NEUTROS PCT: 59.3 %
Platelets: 308 10*3/uL (ref 140–400)
RBC: 5.7 10*6/uL (ref 4.20–5.80)
RDW: 12.6 % (ref 11.0–15.0)
Total Lymphocyte: 30.9 %
WBC: 9.6 10*3/uL (ref 3.8–10.8)
WBCMIX: 643 {cells}/uL (ref 200–950)

## 2018-08-14 LAB — TSH: TSH: 0.59 m[IU]/L (ref 0.40–4.50)

## 2018-08-14 LAB — HEPATITIS PANEL, ACUTE
HEP A IGM: NONREACTIVE
HEP B S AG: NONREACTIVE
Hep B C IgM: NONREACTIVE
Hepatitis C Ab: NONREACTIVE
SIGNAL TO CUT-OFF: 0.01 (ref <1.00)

## 2018-08-14 LAB — RPR: RPR: NONREACTIVE

## 2018-08-14 LAB — COMPLETE METABOLIC PANEL WITH GFR
AG RATIO: 1.3 (calc) (ref 1.0–2.5)
ALBUMIN MSPROF: 4 g/dL (ref 3.6–5.1)
ALT: 23 U/L (ref 9–46)
AST: 13 U/L (ref 10–40)
Alkaline phosphatase (APISO): 114 U/L (ref 40–115)
BUN: 11 mg/dL (ref 7–25)
CALCIUM: 9.4 mg/dL (ref 8.6–10.3)
CO2: 28 mmol/L (ref 20–32)
CREATININE: 0.92 mg/dL (ref 0.60–1.35)
Chloride: 98 mmol/L (ref 98–110)
GFR, EST NON AFRICAN AMERICAN: 102 mL/min/{1.73_m2} (ref >=60)
GFR, Est African American: 118 mL/min/{1.73_m2} (ref >=60)
GLOBULIN: 3 g/dL (ref 1.9–3.7)
Glucose, Bld: 393 mg/dL — ABNORMAL HIGH (ref 65–139)
POTASSIUM: 4.3 mmol/L (ref 3.5–5.3)
SODIUM: 133 mmol/L — AB (ref 135–146)
Total Bilirubin: 1 mg/dL (ref 0.2–1.2)
Total Protein: 7 g/dL (ref 6.1–8.1)

## 2018-08-14 LAB — C. TRACHOMATIS/N. GONORRHOEAE RNA
C. trachomatis RNA, TMA: NOT DETECTED
N. GONORRHOEAE RNA, TMA: NOT DETECTED

## 2018-08-14 LAB — HEMOGLOBIN A1C
EAG (MMOL/L): 18.7 (calc)
Hgb A1c MFr Bld: 13.4 % of total Hgb — ABNORMAL HIGH (ref <5.7)
Mean Plasma Glucose: 338 (calc)

## 2018-08-14 LAB — TRICHOMONAS VAGINALIS RNA, QL,MALES: TRICHOMONAS VAGINALIS RNA: NOT DETECTED

## 2018-08-14 LAB — MICROALBUMIN / CREATININE URINE RATIO
Creatinine, Urine: 49 mg/dL (ref 20–320)
Microalb Creat Ratio: 4 mcg/mg creat (ref <30)
Microalb, Ur: 0.2 mg/dL

## 2018-08-14 LAB — HIV ANTIBODY (ROUTINE TESTING W REFLEX): HIV 1&2 Ab, 4th Generation: NONREACTIVE

## 2018-10-06 ENCOUNTER — Other Ambulatory Visit: Payer: Self-pay

## 2018-10-06 ENCOUNTER — Emergency Department: Payer: BLUE CROSS/BLUE SHIELD

## 2018-10-06 ENCOUNTER — Emergency Department
Admission: EM | Admit: 2018-10-06 | Discharge: 2018-10-06 | Disposition: A | Discharge status: Home / Self Care | Payer: BLUE CROSS/BLUE SHIELD | Attending: Emergency Medicine | Admitting: Emergency Medicine

## 2018-10-06 DIAGNOSIS — Z79899 Other long term (current) drug therapy: Secondary | ICD-10-CM | POA: Diagnosis not present

## 2018-10-06 DIAGNOSIS — E11319 Type 2 diabetes mellitus with unspecified diabetic retinopathy without macular edema: Secondary | ICD-10-CM | POA: Insufficient documentation

## 2018-10-06 DIAGNOSIS — M542 Cervicalgia: Secondary | ICD-10-CM | POA: Insufficient documentation

## 2018-10-06 DIAGNOSIS — F17210 Nicotine dependence, cigarettes, uncomplicated: Secondary | ICD-10-CM | POA: Diagnosis not present

## 2018-10-06 DIAGNOSIS — M25512 Pain in left shoulder: Secondary | ICD-10-CM | POA: Diagnosis not present

## 2018-10-06 DIAGNOSIS — S4992XA Unspecified injury of left shoulder and upper arm, initial encounter: Secondary | ICD-10-CM | POA: Diagnosis not present

## 2018-10-06 DIAGNOSIS — M7918 Myalgia, other site: Secondary | ICD-10-CM

## 2018-10-06 DIAGNOSIS — Z7982 Long term (current) use of aspirin: Secondary | ICD-10-CM | POA: Diagnosis not present

## 2018-10-06 DIAGNOSIS — Z794 Long term (current) use of insulin: Secondary | ICD-10-CM | POA: Insufficient documentation

## 2018-10-06 DIAGNOSIS — I10 Essential (primary) hypertension: Secondary | ICD-10-CM | POA: Insufficient documentation

## 2018-10-06 DIAGNOSIS — M545 Low back pain: Secondary | ICD-10-CM | POA: Insufficient documentation

## 2018-10-06 LAB — URINALYSIS, COMPLETE (UACMP) WITH MICROSCOPIC
BILIRUBIN URINE: NEGATIVE
Bacteria, UA: NONE SEEN
HGB URINE DIPSTICK: NEGATIVE
KETONES UR: NEGATIVE mg/dL
Leukocytes, UA: NEGATIVE
NITRITE: NEGATIVE
PH: 5 (ref 5.0–8.0)
Protein, ur: NEGATIVE mg/dL
SQUAMOUS EPITHELIAL / LPF: NONE SEEN (ref 0–5)
Specific Gravity, Urine: 1.029 (ref 1.005–1.030)
WBC, UA: NONE SEEN WBC/hpf (ref 0–5)

## 2018-10-06 LAB — BASIC METABOLIC PANEL
Anion gap: 12 (ref 5–15)
BUN: 14 mg/dL (ref 6–20)
CALCIUM: 8.6 mg/dL — AB (ref 8.9–10.3)
CO2: 19 mmol/L — ABNORMAL LOW (ref 22–32)
CREATININE: 0.77 mg/dL (ref 0.61–1.24)
Chloride: 101 mmol/L (ref 98–111)
GFR calc Af Amer: >60 mL/min (ref >60)
Glucose, Bld: 399 mg/dL — ABNORMAL HIGH (ref 70–99)
Potassium: 3.6 mmol/L (ref 3.5–5.1)
SODIUM: 132 mmol/L — AB (ref 135–145)

## 2018-10-06 LAB — GLUCOSE, CAPILLARY
GLUCOSE-CAPILLARY: 201 mg/dL — AB (ref 70–99)
Glucose-Capillary: 382 mg/dL — ABNORMAL HIGH (ref 70–99)

## 2018-10-06 LAB — CBC
HCT: 44.4 % (ref 39.0–52.0)
Hemoglobin: 15.3 g/dL (ref 13.0–17.0)
MCH: 29.5 pg (ref 26.0–34.0)
MCHC: 34.5 g/dL (ref 30.0–36.0)
MCV: 85.7 fL (ref 80.0–100.0)
PLATELETS: 346 10*3/uL (ref 150–400)
RBC: 5.18 MIL/uL (ref 4.22–5.81)
RDW: 12.4 % (ref 11.5–15.5)
WBC: 11 10*3/uL — AB (ref 4.0–10.5)
nRBC: 0 % (ref 0.0–0.2)

## 2018-10-06 MED ORDER — CYCLOBENZAPRINE HCL 10 MG PO TABS
10.0000 mg | ORAL_TABLET | Freq: Once | ORAL | Status: AC
  Administered 2018-10-06: 10 mg via ORAL
  Filled 2018-10-06: qty 1

## 2018-10-06 MED ORDER — DICLOFENAC SODIUM 50 MG PO TBEC: 50.0000 mg | DELAYED_RELEASE_TABLET | Freq: Two times a day (BID) | ORAL | 0 refills | Status: AC

## 2018-10-06 MED ORDER — CYCLOBENZAPRINE HCL 5 MG PO TABS: 5.0000 mg | ORAL_TABLET | Freq: Three times a day (TID) | ORAL | 0 refills | Status: DC | PRN

## 2018-10-06 MED ORDER — NAPROXEN 500 MG PO TABS
500.0000 mg | ORAL_TABLET | Freq: Once | ORAL | Status: AC
  Administered 2018-10-06: 500 mg via ORAL
  Filled 2018-10-06: qty 1
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
## 2018-10-08 ENCOUNTER — Ambulatory Visit (INDEPENDENT_AMBULATORY_CARE_PROVIDER_SITE_OTHER): Payer: BLUE CROSS/BLUE SHIELD | Admitting: Nurse Practitioner

## 2018-10-08 ENCOUNTER — Encounter: Payer: Self-pay | Admitting: Nurse Practitioner

## 2018-10-08 DIAGNOSIS — M542 Cervicalgia: Secondary | ICD-10-CM | POA: Diagnosis not present

## 2018-10-08 DIAGNOSIS — M549 Dorsalgia, unspecified: Secondary | ICD-10-CM | POA: Diagnosis not present

## 2018-10-08 DIAGNOSIS — E1165 Type 2 diabetes mellitus with hyperglycemia: Secondary | ICD-10-CM

## 2018-10-08 DIAGNOSIS — I7090 Unspecified atherosclerosis: Secondary | ICD-10-CM

## 2018-10-08 MED ORDER — ATORVASTATIN CALCIUM 10 MG PO TABS: 10.0000 mg | ORAL_TABLET | Freq: Every day | ORAL | 5 refills | Status: DC
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
## 2018-10-08 NOTE — Patient Instructions (Addendum)
-   Please set up appointment with endocrinologist - Sent referral for chiropracter; if you do not get a phone call within a week please call and let us know - Please take medications as prescribed - Use heat on muscles and light stretching.  - Please drink plenty of water at least 64 ounces a day - Start taking atorvastatin (cholesterol medicine) - Please call eye doctor and schedule an appointment as soon as possible.

## 2018-10-10 ENCOUNTER — Encounter: Payer: Self-pay | Admitting: Family Medicine

## 2018-10-19 ENCOUNTER — Other Ambulatory Visit: Payer: Self-pay | Admitting: Nurse Practitioner

## 2018-10-19 ENCOUNTER — Encounter: Payer: Self-pay | Admitting: Family Medicine

## 2018-10-19 ENCOUNTER — Ambulatory Visit: Payer: BLUE CROSS/BLUE SHIELD | Admitting: Family Medicine

## 2018-10-19 DIAGNOSIS — M542 Cervicalgia: Secondary | ICD-10-CM

## 2018-10-19 MED ORDER — MELOXICAM 15 MG PO TABS: 15.0000 mg | ORAL_TABLET | Freq: Every day | ORAL | 0 refills | Status: DC

## 2018-10-19 NOTE — Telephone Encounter (Signed)
This appears to have been completed I am signing off on this encounter

## 2018-10-19 NOTE — Telephone Encounter (Signed)
Patient was seen by another provider; routing to that provider

## 2018-11-08 ENCOUNTER — Encounter: Payer: Self-pay | Admitting: Family Medicine

## 2018-11-08 ENCOUNTER — Ambulatory Visit
Admission: RE | Admit: 2018-11-08 | Discharge: 2018-11-08 | Disposition: A | Discharge status: Home / Self Care | Payer: BLUE CROSS/BLUE SHIELD | Attending: Family Medicine | Admitting: Family Medicine

## 2018-11-08 ENCOUNTER — Ambulatory Visit (INDEPENDENT_AMBULATORY_CARE_PROVIDER_SITE_OTHER): Payer: BLUE CROSS/BLUE SHIELD | Admitting: Family Medicine

## 2018-11-08 ENCOUNTER — Ambulatory Visit
Admission: RE | Admit: 2018-11-08 | Discharge: 2018-11-08 | Disposition: A | Discharge status: Home / Self Care | Payer: BLUE CROSS/BLUE SHIELD | Source: Ambulatory Visit | Attending: Family Medicine | Admitting: Family Medicine

## 2018-11-08 VITALS — BP 120/78 | HR 99 | Temp 98.0°F | Ht 68.0 in | Wt 174.8 lb

## 2018-11-08 DIAGNOSIS — R059 Cough, unspecified: Secondary | ICD-10-CM

## 2018-11-08 DIAGNOSIS — E1165 Type 2 diabetes mellitus with hyperglycemia: Secondary | ICD-10-CM

## 2018-11-08 DIAGNOSIS — R042 Hemoptysis: Secondary | ICD-10-CM

## 2018-11-08 DIAGNOSIS — R05 Cough: Secondary | ICD-10-CM

## 2018-11-08 DIAGNOSIS — Z794 Long term (current) use of insulin: Secondary | ICD-10-CM | POA: Diagnosis not present

## 2018-11-08 DIAGNOSIS — J0100 Acute maxillary sinusitis, unspecified: Secondary | ICD-10-CM

## 2018-11-08 DIAGNOSIS — E11319 Type 2 diabetes mellitus with unspecified diabetic retinopathy without macular edema: Secondary | ICD-10-CM

## 2018-11-08 DIAGNOSIS — F4321 Adjustment disorder with depressed mood: Secondary | ICD-10-CM

## 2018-11-08 MED ORDER — DOXYCYCLINE HYCLATE 100 MG PO TABS: 100.0000 mg | ORAL_TABLET | Freq: Two times a day (BID) | ORAL | 0 refills | Status: DC
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
## 2018-11-08 NOTE — Assessment & Plan Note (Signed)
Urged patient to start back on his insulin and see his endocrinologist right away; foot exam by MD today

## 2018-11-08 NOTE — Patient Instructions (Addendum)
Please do get in to see the endocrinologist as soon as possible Start the antibiotics Have the xray done across the street Please do start back on your insulin right away High sugars will make it harder for your body to fight infection We'll have you see the psychologist

## 2018-11-09 ENCOUNTER — Other Ambulatory Visit: Payer: Self-pay | Admitting: Family Medicine

## 2018-11-09 DIAGNOSIS — E785 Hyperlipidemia, unspecified: Secondary | ICD-10-CM

## 2018-11-09 DIAGNOSIS — I7090 Unspecified atherosclerosis: Secondary | ICD-10-CM

## 2018-11-09 LAB — CBC WITH DIFFERENTIAL/PLATELET
Absolute Monocytes: 689 cells/uL (ref 200–950)
Basophils Absolute: 86 cells/uL (ref 0–200)
Basophils Relative: 0.7 %
EOS PCT: 3.5 %
Eosinophils Absolute: 431 cells/uL (ref 15–500)
HEMATOCRIT: 47.8 % (ref 38.5–50.0)
Hemoglobin: 16.2 g/dL (ref 13.2–17.1)
LYMPHS ABS: 4059 {cells}/uL — AB (ref 850–3900)
MCH: 29.1 pg (ref 27.0–33.0)
MCHC: 33.9 g/dL (ref 32.0–36.0)
MCV: 85.8 fL (ref 80.0–100.0)
MONOS PCT: 5.6 %
MPV: 10.6 fL (ref 7.5–12.5)
NEUTROS PCT: 57.2 %
Neutro Abs: 7036 cells/uL (ref 1500–7800)
PLATELETS: 308 10*3/uL (ref 140–400)
RBC: 5.57 10*6/uL (ref 4.20–5.80)
RDW: 12.3 % (ref 11.0–15.0)
Total Lymphocyte: 33 %
WBC: 12.3 10*3/uL — AB (ref 3.8–10.8)

## 2018-11-09 LAB — LIPID PANEL
CHOLESTEROL: 184 mg/dL (ref <200)
HDL: 33 mg/dL — ABNORMAL LOW (ref >40)
LDL Cholesterol (Calc): 120 mg/dL (calc) — ABNORMAL HIGH
Non-HDL Cholesterol (Calc): 151 mg/dL (calc) — ABNORMAL HIGH (ref <130)
Total CHOL/HDL Ratio: 5.6 (calc) — ABNORMAL HIGH (ref <5.0)
Triglycerides: 185 mg/dL — ABNORMAL HIGH (ref <150)

## 2018-11-09 LAB — MICROALBUMIN / CREATININE URINE RATIO: Creatinine, Urine: 43 mg/dL (ref 20–320)

## 2018-11-09 LAB — COMPLETE METABOLIC PANEL WITH GFR
AG RATIO: 1.4 (calc) (ref 1.0–2.5)
ALT: 21 U/L (ref 9–46)
AST: 16 U/L (ref 10–40)
Albumin: 4.1 g/dL (ref 3.6–5.1)
Alkaline phosphatase (APISO): 124 U/L — ABNORMAL HIGH (ref 40–115)
BILIRUBIN TOTAL: 0.9 mg/dL (ref 0.2–1.2)
BUN: 16 mg/dL (ref 7–25)
CALCIUM: 9.4 mg/dL (ref 8.6–10.3)
CO2: 25 mmol/L (ref 20–32)
Chloride: 98 mmol/L (ref 98–110)
Creat: 0.98 mg/dL (ref 0.60–1.35)
GFR, EST NON AFRICAN AMERICAN: 94 mL/min/{1.73_m2} (ref >=60)
GFR, Est African American: 109 mL/min/{1.73_m2} (ref >=60)
Globulin: 3 g/dL (calc) (ref 1.9–3.7)
Glucose, Bld: 386 mg/dL — ABNORMAL HIGH (ref 65–139)
POTASSIUM: 4.3 mmol/L (ref 3.5–5.3)
Sodium: 133 mmol/L — ABNORMAL LOW (ref 135–146)
Total Protein: 7.1 g/dL (ref 6.1–8.1)

## 2018-11-09 LAB — HEMOGLOBIN A1C

## 2018-11-09 MED ORDER — ATORVASTATIN CALCIUM 20 MG PO TABS: 20.0000 mg | ORAL_TABLET | Freq: Every day | ORAL | 1 refills | Status: DC

## 2018-11-16 DIAGNOSIS — M545 Low back pain: Secondary | ICD-10-CM | POA: Diagnosis not present

## 2018-11-16 DIAGNOSIS — M542 Cervicalgia: Secondary | ICD-10-CM | POA: Diagnosis not present

## 2018-11-22 ENCOUNTER — Ambulatory Visit: Payer: Self-pay | Admitting: Family Medicine

## 2018-11-22 ENCOUNTER — Encounter: Payer: Self-pay | Admitting: Family Medicine

## 2018-11-22 VITALS — BP 130/88 | HR 100 | Temp 98.3°F | Ht 68.0 in | Wt 173.3 lb

## 2018-11-22 DIAGNOSIS — G8911 Acute pain due to trauma: Secondary | ICD-10-CM

## 2018-11-22 DIAGNOSIS — M5441 Lumbago with sciatica, right side: Secondary | ICD-10-CM

## 2018-11-22 DIAGNOSIS — M542 Cervicalgia: Secondary | ICD-10-CM

## 2018-11-22 DIAGNOSIS — G8929 Other chronic pain: Secondary | ICD-10-CM

## 2018-11-22 DIAGNOSIS — R413 Other amnesia: Secondary | ICD-10-CM

## 2018-11-22 DIAGNOSIS — F4321 Adjustment disorder with depressed mood: Secondary | ICD-10-CM

## 2018-11-22 DIAGNOSIS — R410 Disorientation, unspecified: Secondary | ICD-10-CM

## 2018-11-22 DIAGNOSIS — M546 Pain in thoracic spine: Secondary | ICD-10-CM

## 2018-11-22 MED ORDER — ESCITALOPRAM OXALATE 10 MG PO TABS: 10.0000 mg | ORAL_TABLET | Freq: Every day | ORAL | 0 refills | Status: DC

## 2018-11-22 NOTE — Patient Instructions (Addendum)
Please do take a multiple vitamin daily Start the new medicine for depressive symptoms (escitalopram) Take one a day Return in two weeks for follow-up on your mood, call sooner if needed CANCEL the appointment with me for later in January  Only use the meloxicam when really needed because it can increase the risk of a stomach bleed while on escitalopram Okay to take Tylenol per package directions, up to 3,000 mg per day total We'll have you see the orthopaedist soon  Here are some resources to help you if you feel you are in a mental health crisis:  [place] - Call [phone]  for help - Website with more resources: GripTrip.com.pt  [facility] - Call [phone] for help. - Mobile Crisis Program available 24 hours a day, 365 days a year. - Available for anyone of any age in [place] counties.  [facility] - Address: [address], [place] - Telephone: [phone]  - Hours of Operation: Sunday - Saturday - 8:00 a.m. - 8:00 p.m. - Medicaid, Medicare (Government Issued Only), BCBS, and [place] [facility], [place], Psychiatrists on-site to provide medication management, [place], and Peer Support Care.  [facility] - Call [phone] for help. - Mobile Crisis Program available 24 hours a day, 365 days a year. - Available for anyone of any age in [place]  Work with your endocrinologist about your diabetes; frequent thirst is often a sign of high blood pressures and it's important to get your sugars under control
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
## 2018-11-26 ENCOUNTER — Encounter: Payer: Self-pay | Admitting: Family Medicine

## 2018-12-03 ENCOUNTER — Encounter: Payer: Self-pay | Admitting: Family Medicine

## 2018-12-03 ENCOUNTER — Telehealth: Payer: Self-pay

## 2018-12-03 NOTE — Telephone Encounter (Signed)
Jamie notified patient paperwork is ready for pick up and notified patient the remainder of the paperwork needs to be completed by a physical therapist for [facility]. Pt stated he is not supposed to see a PT until the end of the month after MRI. Paperwork is being faxed.

## 2018-12-03 NOTE — Telephone Encounter (Signed)
Copied from [place] [id number]. Topic: General - Other >> Dec 03, 2018 12:47 PM Marin Olp L wrote: Reason for CRM: Patient's wife, April calling to check on status of getting forms faxed for work. See mychart message from 11/26/2018.

## 2018-12-04 ENCOUNTER — Other Ambulatory Visit: Payer: Self-pay

## 2018-12-05 ENCOUNTER — Encounter: Payer: Self-pay | Admitting: Family Medicine

## 2018-12-05 MED ORDER — LORAZEPAM 0.5 MG PO TABS: ORAL_TABLET | ORAL | 0 refills | Status: DC

## 2018-12-06 ENCOUNTER — Ambulatory Visit: Payer: BLUE CROSS/BLUE SHIELD | Admitting: Family Medicine

## 2018-12-06 ENCOUNTER — Encounter: Payer: Self-pay | Admitting: Family Medicine

## 2018-12-06 DIAGNOSIS — R413 Other amnesia: Secondary | ICD-10-CM | POA: Diagnosis not present

## 2018-12-06 DIAGNOSIS — F0781 Postconcussional syndrome: Secondary | ICD-10-CM | POA: Diagnosis not present

## 2018-12-06 DIAGNOSIS — E119 Type 2 diabetes mellitus without complications: Secondary | ICD-10-CM | POA: Diagnosis not present

## 2018-12-10 ENCOUNTER — Ambulatory Visit
Admission: RE | Admit: 2018-12-10 | Discharge: 2018-12-10 | Disposition: A | Discharge status: Home / Self Care | Payer: BLUE CROSS/BLUE SHIELD | Source: Ambulatory Visit | Attending: Family Medicine | Admitting: Family Medicine

## 2018-12-10 ENCOUNTER — Encounter: Payer: Self-pay | Admitting: Family Medicine

## 2018-12-10 DIAGNOSIS — M542 Cervicalgia: Secondary | ICD-10-CM

## 2018-12-10 DIAGNOSIS — S199XXA Unspecified injury of neck, initial encounter: Secondary | ICD-10-CM | POA: Diagnosis not present

## 2018-12-10 DIAGNOSIS — S299XXA Unspecified injury of thorax, initial encounter: Secondary | ICD-10-CM | POA: Diagnosis not present

## 2018-12-10 DIAGNOSIS — G8911 Acute pain due to trauma: Secondary | ICD-10-CM | POA: Insufficient documentation

## 2018-12-10 DIAGNOSIS — M545 Low back pain: Secondary | ICD-10-CM | POA: Diagnosis not present

## 2018-12-10 DIAGNOSIS — S3992XA Unspecified injury of lower back, initial encounter: Secondary | ICD-10-CM | POA: Diagnosis not present

## 2018-12-11 DIAGNOSIS — M5442 Lumbago with sciatica, left side: Secondary | ICD-10-CM | POA: Diagnosis not present

## 2018-12-11 DIAGNOSIS — M533 Sacrococcygeal disorders, not elsewhere classified: Secondary | ICD-10-CM | POA: Diagnosis not present

## 2018-12-11 DIAGNOSIS — M7918 Myalgia, other site: Secondary | ICD-10-CM | POA: Diagnosis not present

## 2018-12-11 DIAGNOSIS — M542 Cervicalgia: Secondary | ICD-10-CM | POA: Diagnosis not present

## 2018-12-12 ENCOUNTER — Encounter: Payer: Self-pay | Admitting: Family Medicine

## 2018-12-12 ENCOUNTER — Ambulatory Visit: Payer: BLUE CROSS/BLUE SHIELD | Admitting: Family Medicine

## 2018-12-12 ENCOUNTER — Ambulatory Visit (INDEPENDENT_AMBULATORY_CARE_PROVIDER_SITE_OTHER): Payer: BLUE CROSS/BLUE SHIELD | Admitting: Family Medicine

## 2018-12-12 DIAGNOSIS — M47812 Spondylosis without myelopathy or radiculopathy, cervical region: Secondary | ICD-10-CM | POA: Diagnosis not present

## 2018-12-12 DIAGNOSIS — F0781 Postconcussional syndrome: Secondary | ICD-10-CM

## 2018-12-12 DIAGNOSIS — M542 Cervicalgia: Secondary | ICD-10-CM | POA: Insufficient documentation

## 2018-12-12 DIAGNOSIS — R413 Other amnesia: Secondary | ICD-10-CM | POA: Insufficient documentation

## 2018-12-12 DIAGNOSIS — M5090 Cervical disc disorder, unspecified, unspecified cervical region: Secondary | ICD-10-CM

## 2018-12-12 DIAGNOSIS — M4802 Spinal stenosis, cervical region: Secondary | ICD-10-CM | POA: Insufficient documentation

## 2018-12-12 DIAGNOSIS — M4800 Spinal stenosis, site unspecified: Secondary | ICD-10-CM | POA: Diagnosis not present

## 2018-12-12 DIAGNOSIS — E119 Type 2 diabetes mellitus without complications: Secondary | ICD-10-CM | POA: Insufficient documentation

## 2018-12-12 DIAGNOSIS — I10 Essential (primary) hypertension: Secondary | ICD-10-CM

## 2018-12-12 HISTORY — DX: Postconcussional syndrome: F07.81

## 2018-12-12 MED ORDER — DULOXETINE HCL 30 MG PO CPEP: ORAL_CAPSULE | ORAL | 0 refills | Status: DC

## 2018-12-12 MED ORDER — DULOXETINE HCL 30 MG PO CPEP: 30.0000 mg | ORAL_CAPSULE | Freq: Every day | ORAL | 0 refills | Status: DC

## 2018-12-12 NOTE — Assessment & Plan Note (Signed)
Refer to PT and neurosurgeon

## 2018-12-12 NOTE — Assessment & Plan Note (Signed)
Refer to neurosurgeon
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
## 2018-12-12 NOTE — Assessment & Plan Note (Signed)
Noted on MRI; refer to neurosurgeon and PT

## 2018-12-12 NOTE — Patient Instructions (Addendum)
I will strongly recommend that you drink NO regular soda Please do contact Dr. Melrose Nakayama about the headaches We'll extend your work note until December 31, 2018 to allow time for you to see the specialists and the physical therapists  NovoLog is a mealtime insulin that helps control blood sugar spikes that happen when you eat. NovoLog is taken 5-10 minutes before the start of a meal.  Make sure you eat... don't use your rapid insulin and then skip your meal.  We'll have see the neurosurgeon and the physical therapist Stop the lexapro (escitalopram) Start the new medicine cymbalta (duloxetine)

## 2018-12-18 DIAGNOSIS — E113293 Type 2 diabetes mellitus with mild nonproliferative diabetic retinopathy without macular edema, bilateral: Secondary | ICD-10-CM | POA: Diagnosis not present

## 2018-12-18 DIAGNOSIS — E1165 Type 2 diabetes mellitus with hyperglycemia: Secondary | ICD-10-CM | POA: Diagnosis not present

## 2018-12-18 DIAGNOSIS — E1142 Type 2 diabetes mellitus with diabetic polyneuropathy: Secondary | ICD-10-CM | POA: Diagnosis not present

## 2018-12-18 DIAGNOSIS — Z794 Long term (current) use of insulin: Secondary | ICD-10-CM | POA: Diagnosis not present

## 2018-12-20 ENCOUNTER — Encounter: Payer: Self-pay | Admitting: Family Medicine

## 2018-12-26 ENCOUNTER — Encounter: Payer: Self-pay | Admitting: Family Medicine

## 2018-12-27 ENCOUNTER — Encounter: Payer: Self-pay | Admitting: Nurse Practitioner

## 2018-12-27 ENCOUNTER — Telehealth: Payer: Self-pay

## 2018-12-27 ENCOUNTER — Ambulatory Visit (INDEPENDENT_AMBULATORY_CARE_PROVIDER_SITE_OTHER): Payer: BLUE CROSS/BLUE SHIELD | Admitting: Nurse Practitioner

## 2018-12-27 VITALS — BP 138/98 | HR 93 | Temp 98.0°F | Resp 16 | Ht 68.0 in | Wt 180.7 lb

## 2018-12-27 DIAGNOSIS — M47812 Spondylosis without myelopathy or radiculopathy, cervical region: Secondary | ICD-10-CM | POA: Diagnosis not present

## 2018-12-27 DIAGNOSIS — M4800 Spinal stenosis, site unspecified: Secondary | ICD-10-CM

## 2018-12-27 DIAGNOSIS — M5441 Lumbago with sciatica, right side: Secondary | ICD-10-CM

## 2018-12-27 DIAGNOSIS — G8929 Other chronic pain: Secondary | ICD-10-CM

## 2018-12-27 DIAGNOSIS — E1165 Type 2 diabetes mellitus with hyperglycemia: Secondary | ICD-10-CM

## 2018-12-27 DIAGNOSIS — Z794 Long term (current) use of insulin: Secondary | ICD-10-CM

## 2018-12-27 DIAGNOSIS — I10 Essential (primary) hypertension: Secondary | ICD-10-CM

## 2018-12-27 DIAGNOSIS — M5090 Cervical disc disorder, unspecified, unspecified cervical region: Secondary | ICD-10-CM

## 2018-12-27 DIAGNOSIS — M4802 Spinal stenosis, cervical region: Secondary | ICD-10-CM | POA: Diagnosis not present

## 2018-12-27 MED ORDER — TIZANIDINE HCL 2 MG PO TABS: 2.0000 mg | ORAL_TABLET | Freq: Four times a day (QID) | ORAL | 0 refills | Status: DC | PRN

## 2018-12-27 MED ORDER — HYDROCHLOROTHIAZIDE 12.5 MG PO TABS: 12.5000 mg | ORAL_TABLET | Freq: Every day | ORAL | 0 refills | Status: DC

## 2018-12-27 MED ORDER — BENAZEPRIL HCL 20 MG PO TABS: 20.0000 mg | ORAL_TABLET | Freq: Every day | ORAL | 0 refills | Status: DC
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
## 2018-12-27 NOTE — Patient Instructions (Addendum)
- Please schedule a routine eye exam to check for diabetic retinopathy within the next 3 months.  - Put an alarm on your phone to remind you to take your blood pressure medications- they do not work unless you take them.   Your goal blood pressure is less than 140 mmHg on top. Try to follow the DASH guidelines (DASH stands for Dietary Approaches to Stop Hypertension) Try to limit the sodium in your diet.  Ideally, consume less than 1.5 grams (less than 1,500mg ) per day. Do not add salt when cooking or at the table.  Check the sodium amount on labels when shopping, and choose items lower in sodium when given a choice. Avoid or limit foods that already contain a lot of sodium.  Eat a diet rich in fruits and vegetables and whole grains.  Carbohydrate Counting for Diabetes Mellitus, Adult Carbohydrate counting is a method of keeping track of how many carbohydrates you eat. Eating carbohydrates naturally increases the amount of sugar (glucose) in the blood. Counting how many carbohydrates you eat helps keep your blood glucose within normal limits, which helps you manage your diabetes (diabetes mellitus). It is important to know how many carbohydrates you can safely have in each meal. This is different for every person. A diet and nutrition specialist (registered dietitian) can help you make a meal plan and calculate how many carbohydrates you should have at each meal and snack. Carbohydrates are found in the following foods:  Grains, such as breads and cereals.  Dried beans and soy products.  Starchy vegetables, such as potatoes, peas, and corn.  Fruit and fruit juices.  Milk and yogurt.  Sweets and snack foods, such as cake, cookies, candy, chips, and soft drinks. How do I count carbohydrates? There are two ways to count carbohydrates in food. You can use either of the methods or a combination of both. Reading "Nutrition Facts" on packaged food The "Nutrition Facts" list is included on the  labels of almost all packaged foods and beverages in the [place] It includes:  The serving size.  Information about nutrients in each serving, including the grams (g) of carbohydrate per serving. To use the "Nutrition Facts":  Decide how many servings you will have.  Multiply the number of servings by the number of carbohydrates per serving.  The resulting number is the total amount of carbohydrates that you will be having. Learning standard serving sizes of other foods When you eat carbohydrate foods that are not packaged or do not include "Nutrition Facts" on the label, you need to measure the servings in order to count the amount of carbohydrates:  Measure the foods that you will eat with a food scale or measuring cup, if needed.  Decide how many standard-size servings you will eat.  Multiply the number of servings by 15. Most carbohydrate-rich foods have about 15 g of carbohydrates per serving. ? For example, if you eat 8 oz (170 g) of strawberries, you will have eaten 2 servings and 30 g of carbohydrates (2 servings x 15 g = 30 g).  For foods that have more than one food mixed, such as soups and casseroles, you must count the carbohydrates in each food that is included. The following list contains standard serving sizes of common carbohydrate-rich foods. Each of these servings has about 15 g of carbohydrates:   hamburger bun or  English muffin.   oz (15 mL) syrup.   oz (14 g) jelly.  1 slice of bread.  1 six-inch  tortilla.  3 oz (85 g) cooked rice or pasta.  4 oz (113 g) cooked dried beans.  4 oz (113 g) starchy vegetable, such as peas, corn, or potatoes.  4 oz (113 g) hot cereal.  4 oz (113 g) mashed potatoes or  of a large baked potato.  4 oz (113 g) canned or frozen fruit.  4 oz (120 mL) fruit juice.  4-6 crackers.  6 chicken nuggets.  6 oz (170 g) unsweetened dry cereal.  6 oz (170 g) plain fat-free yogurt or yogurt sweetened with artificial  sweeteners.  8 oz (240 mL) milk.  8 oz (170 g) fresh fruit or one small piece of fruit.  24 oz (680 g) popped popcorn. Example of carbohydrate counting Sample meal  3 oz (85 g) chicken breast.  6 oz (170 g) brown rice.  4 oz (113 g) corn.  8 oz (240 mL) milk.  8 oz (170 g) strawberries with sugar-free whipped topping. Carbohydrate calculation 1. Identify the foods that contain carbohydrates: ? Rice. ? Corn. ? Milk. ? Strawberries. 2. Calculate how many servings you have of each food: ? 2 servings rice. ? 1 serving corn. ? 1 serving milk. ? 1 serving strawberries. 3. Multiply each number of servings by 15 g: ? 2 servings rice x 15 g = 30 g. ? 1 serving corn x 15 g = 15 g. ? 1 serving milk x 15 g = 15 g. ? 1 serving strawberries x 15 g = 15 g. 4. Add together all of the amounts to find the total grams of carbohydrates eaten: ? 30 g + 15 g + 15 g + 15 g = 75 g of carbohydrates total. Summary  Carbohydrate counting is a method of keeping track of how many carbohydrates you eat.  Eating carbohydrates naturally increases the amount of sugar (glucose) in the blood.  Counting how many carbohydrates you eat helps keep your blood glucose within normal limits, which helps you manage your diabetes.  A diet and nutrition specialist (registered dietitian) can help you make a meal plan and calculate how many carbohydrates you should have at each meal and snack. This information is not intended to replace advice given to you by your health care provider. Make sure you discuss any questions you have with your health care provider. Document Released: 10/31/2005 Document Revised: 05/10/2017 Document Reviewed: 04/13/2016 Elsevier Interactive Patient Education  2019 [employer].

## 2018-12-27 NOTE — Telephone Encounter (Signed)
I tried to contact this patient to ask him about his appt today and to see if we needed to reschedule with Dr. Sanda Klein instead.

## 2018-12-27 NOTE — Assessment & Plan Note (Signed)
Uncontrolled today, but patient hasn't taken any of his medicine he says; get medicine asap; monitor BP at home; goal is less than 130/80

## 2019-01-01 ENCOUNTER — Telehealth: Payer: Self-pay | Admitting: Family Medicine

## 2019-01-01 MED ORDER — DULOXETINE HCL 60 MG PO CPEP: 60.0000 mg | ORAL_CAPSULE | Freq: Every day | ORAL | 3 refills | Status: DC

## 2019-01-01 NOTE — Telephone Encounter (Signed)
Copied from [place] [id number]. Topic: Quick Communication - See Telephone Encounter >> Jan 01, 2019  3:58 PM Margot Ables wrote: CRM for notification. See Telephone encounter for: 01/01/19.  Pt calling to f/u on disability paperwork stating he has not received further mychart msgs from Dr. Sanda Klein. Pt notes that [employer] has contact him regarding the claim and they have not received further documentation. Pt tried to work yesterday and was sent home stating he has to pass a physical (will be scheduled thru his employer after his neurosurgery appt). Pt has appt with Neurosurgery on Thursday 01/03/2019. Pt sees ortho on 01/10/2019. Please follow up with pt as this is his only source of income.

## 2019-01-01 NOTE — Telephone Encounter (Signed)
PT is needed January 15, 2019 is the earliest they can see him They would not let him return to work until he passes a physical through work Bending, squatting, get heart rate up for the exam Neck swelling again after walking on concrete at a car show, feet and legs puffy after walking around Taking inflammation pills Chiropractor Extend note to after PT, return March 4th

## 2019-01-02 ENCOUNTER — Encounter: Payer: Self-pay | Admitting: Family Medicine

## 2019-01-03 DIAGNOSIS — M542 Cervicalgia: Secondary | ICD-10-CM | POA: Diagnosis not present

## 2019-01-03 DIAGNOSIS — M545 Low back pain: Secondary | ICD-10-CM | POA: Diagnosis not present

## 2019-01-03 DIAGNOSIS — M79601 Pain in right arm: Secondary | ICD-10-CM | POA: Diagnosis not present

## 2019-01-03 DIAGNOSIS — M79604 Pain in right leg: Secondary | ICD-10-CM | POA: Diagnosis not present

## 2019-01-07 ENCOUNTER — Encounter: Payer: Self-pay | Admitting: Family Medicine

## 2019-01-08 DIAGNOSIS — M533 Sacrococcygeal disorders, not elsewhere classified: Secondary | ICD-10-CM | POA: Diagnosis not present

## 2019-01-08 DIAGNOSIS — M542 Cervicalgia: Secondary | ICD-10-CM | POA: Diagnosis not present

## 2019-01-08 DIAGNOSIS — M5442 Lumbago with sciatica, left side: Secondary | ICD-10-CM | POA: Diagnosis not present

## 2019-01-08 DIAGNOSIS — M7918 Myalgia, other site: Secondary | ICD-10-CM | POA: Diagnosis not present

## 2019-01-15 ENCOUNTER — Ambulatory Visit: Payer: BLUE CROSS/BLUE SHIELD | Attending: Family Medicine

## 2019-01-15 DIAGNOSIS — M542 Cervicalgia: Secondary | ICD-10-CM | POA: Insufficient documentation

## 2019-01-15 DIAGNOSIS — M546 Pain in thoracic spine: Secondary | ICD-10-CM | POA: Diagnosis not present

## 2019-01-15 DIAGNOSIS — M545 Low back pain, unspecified: Secondary | ICD-10-CM
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
## 2019-01-17 ENCOUNTER — Ambulatory Visit: Payer: BLUE CROSS/BLUE SHIELD

## 2019-01-17 DIAGNOSIS — M542 Cervicalgia: Secondary | ICD-10-CM

## 2019-01-17 DIAGNOSIS — M545 Low back pain, unspecified: Secondary | ICD-10-CM

## 2019-01-17 DIAGNOSIS — M546 Pain in thoracic spine: Secondary | ICD-10-CM | POA: Diagnosis not present
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
## 2019-01-22 ENCOUNTER — Ambulatory Visit: Payer: BLUE CROSS/BLUE SHIELD

## 2019-01-22 DIAGNOSIS — M545 Low back pain, unspecified: Secondary | ICD-10-CM

## 2019-01-22 DIAGNOSIS — M546 Pain in thoracic spine: Secondary | ICD-10-CM | POA: Diagnosis not present

## 2019-01-22 DIAGNOSIS — M542 Cervicalgia: Secondary | ICD-10-CM | POA: Diagnosis not present

## 2019-01-23 DIAGNOSIS — F17200 Nicotine dependence, unspecified, uncomplicated: Secondary | ICD-10-CM | POA: Diagnosis not present

## 2019-01-23 DIAGNOSIS — E1165 Type 2 diabetes mellitus with hyperglycemia: Secondary | ICD-10-CM | POA: Diagnosis not present

## 2019-01-23 DIAGNOSIS — E1142 Type 2 diabetes mellitus with diabetic polyneuropathy: Secondary | ICD-10-CM | POA: Diagnosis not present

## 2019-01-23 DIAGNOSIS — E113293 Type 2 diabetes mellitus with mild nonproliferative diabetic retinopathy without macular edema, bilateral: Secondary | ICD-10-CM | POA: Diagnosis not present
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
## 2019-01-24 ENCOUNTER — Other Ambulatory Visit: Payer: Self-pay

## 2019-01-24 ENCOUNTER — Ambulatory Visit: Payer: BLUE CROSS/BLUE SHIELD

## 2019-01-24 DIAGNOSIS — M545 Low back pain, unspecified: Secondary | ICD-10-CM

## 2019-01-24 DIAGNOSIS — M542 Cervicalgia: Secondary | ICD-10-CM | POA: Diagnosis not present

## 2019-01-24 DIAGNOSIS — M546 Pain in thoracic spine: Secondary | ICD-10-CM

## 2019-01-24 DIAGNOSIS — Z9114 Patient's other noncompliance with medication regimen: Secondary | ICD-10-CM | POA: Insufficient documentation
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
## 2019-01-25 ENCOUNTER — Ambulatory Visit: Payer: BLUE CROSS/BLUE SHIELD

## 2019-01-25 VITALS — BP 130/82 | HR 108

## 2019-01-25 DIAGNOSIS — I10 Essential (primary) hypertension: Secondary | ICD-10-CM

## 2019-01-25 NOTE — Progress Notes (Signed)
Patient here for blood pressure check.  He is currently on Benazepril 20mg  and HCTZ 12.5mg .  Patient denies any side effects and blood pressure today is 130/82 and pulse 108.  Consulted with Benjamine Mola and we will continue current regimen. Patient drank caffeine today and is  having pain due to PT,  Also has not drank any water. He denies palpitations, SOB, nausea, vomiting, diarrhea and colds symptoms.  Patient was told to monitor pulse also at home.

## 2019-01-28 ENCOUNTER — Other Ambulatory Visit: Payer: Self-pay

## 2019-01-28 ENCOUNTER — Ambulatory Visit: Payer: BLUE CROSS/BLUE SHIELD

## 2019-01-28 DIAGNOSIS — M542 Cervicalgia: Secondary | ICD-10-CM | POA: Diagnosis not present

## 2019-01-28 DIAGNOSIS — M545 Low back pain, unspecified: Secondary | ICD-10-CM

## 2019-01-28 DIAGNOSIS — M546 Pain in thoracic spine: Secondary | ICD-10-CM | POA: Diagnosis not present
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
## 2019-01-30 ENCOUNTER — Other Ambulatory Visit: Payer: Self-pay

## 2019-01-30 ENCOUNTER — Ambulatory Visit: Payer: BLUE CROSS/BLUE SHIELD

## 2019-01-30 DIAGNOSIS — M545 Low back pain, unspecified: Secondary | ICD-10-CM

## 2019-01-30 DIAGNOSIS — M546 Pain in thoracic spine: Secondary | ICD-10-CM | POA: Diagnosis not present

## 2019-01-30 DIAGNOSIS — M542 Cervicalgia: Secondary | ICD-10-CM
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
## 2019-02-04 ENCOUNTER — Ambulatory Visit: Payer: BLUE CROSS/BLUE SHIELD

## 2019-02-05 DIAGNOSIS — M7918 Myalgia, other site: Secondary | ICD-10-CM | POA: Diagnosis not present

## 2019-02-05 DIAGNOSIS — M5442 Lumbago with sciatica, left side: Secondary | ICD-10-CM | POA: Diagnosis not present

## 2019-02-05 DIAGNOSIS — M533 Sacrococcygeal disorders, not elsewhere classified: Secondary | ICD-10-CM | POA: Diagnosis not present

## 2019-02-05 DIAGNOSIS — M542 Cervicalgia: Secondary | ICD-10-CM | POA: Diagnosis not present

## 2019-02-06 ENCOUNTER — Ambulatory Visit: Payer: BLUE CROSS/BLUE SHIELD

## 2019-02-11 ENCOUNTER — Ambulatory Visit: Payer: BLUE CROSS/BLUE SHIELD

## 2019-02-12 ENCOUNTER — Encounter: Payer: Self-pay | Admitting: Family Medicine

## 2019-02-13 NOTE — Telephone Encounter (Signed)
I typed up letter yesterday I'm not sure if I routed it or sent it correctly so he can see it in [facility], please route letter to him if he cannot see it or offer to mail it Thank you

## 2019-02-13 NOTE — Telephone Encounter (Signed)
Joelene Millin, please see my note from 9:07 this morning and help Mr. Grissett. Thank you

## 2019-03-07 DIAGNOSIS — M7918 Myalgia, other site: Secondary | ICD-10-CM | POA: Diagnosis not present

## 2019-03-07 DIAGNOSIS — M5442 Lumbago with sciatica, left side: Secondary | ICD-10-CM | POA: Diagnosis not present

## 2019-03-07 DIAGNOSIS — M533 Sacrococcygeal disorders, not elsewhere classified: Secondary | ICD-10-CM | POA: Diagnosis not present

## 2019-03-07 DIAGNOSIS — M542 Cervicalgia: Secondary | ICD-10-CM | POA: Diagnosis not present

## 2019-03-14 DIAGNOSIS — R413 Other amnesia: Secondary | ICD-10-CM | POA: Diagnosis not present

## 2019-03-14 DIAGNOSIS — F0781 Postconcussional syndrome: Secondary | ICD-10-CM | POA: Diagnosis not present

## 2019-03-22 DIAGNOSIS — M545 Low back pain: Secondary | ICD-10-CM | POA: Diagnosis not present

## 2019-03-22 DIAGNOSIS — M542 Cervicalgia: Secondary | ICD-10-CM | POA: Diagnosis not present

## 2019-03-28 ENCOUNTER — Ambulatory Visit: Payer: BLUE CROSS/BLUE SHIELD | Admitting: Family Medicine

## 2019-04-11 DIAGNOSIS — M5442 Lumbago with sciatica, left side: Secondary | ICD-10-CM | POA: Diagnosis not present

## 2019-04-11 DIAGNOSIS — M533 Sacrococcygeal disorders, not elsewhere classified: Secondary | ICD-10-CM | POA: Diagnosis not present

## 2019-04-11 DIAGNOSIS — M7918 Myalgia, other site: Secondary | ICD-10-CM | POA: Diagnosis not present

## 2019-04-11 DIAGNOSIS — M542 Cervicalgia: Secondary | ICD-10-CM | POA: Diagnosis not present

## 2019-04-23 ENCOUNTER — Ambulatory Visit: Payer: BLUE CROSS/BLUE SHIELD | Admitting: Nurse Practitioner

## 2019-05-07 DIAGNOSIS — M2560 Stiffness of unspecified joint, not elsewhere classified: Secondary | ICD-10-CM | POA: Diagnosis not present

## 2019-05-07 DIAGNOSIS — R293 Abnormal posture: Secondary | ICD-10-CM | POA: Diagnosis not present

## 2019-05-07 DIAGNOSIS — M542 Cervicalgia: Secondary | ICD-10-CM | POA: Diagnosis not present

## 2019-05-07 DIAGNOSIS — M545 Low back pain: Secondary | ICD-10-CM | POA: Diagnosis not present

## 2019-05-08 DIAGNOSIS — M545 Low back pain: Secondary | ICD-10-CM | POA: Diagnosis not present

## 2019-05-08 DIAGNOSIS — M542 Cervicalgia: Secondary | ICD-10-CM | POA: Diagnosis not present

## 2019-05-08 DIAGNOSIS — R293 Abnormal posture: Secondary | ICD-10-CM | POA: Diagnosis not present

## 2019-05-08 DIAGNOSIS — M2560 Stiffness of unspecified joint, not elsewhere classified: Secondary | ICD-10-CM | POA: Diagnosis not present

## 2019-05-13 DIAGNOSIS — M2560 Stiffness of unspecified joint, not elsewhere classified: Secondary | ICD-10-CM | POA: Diagnosis not present

## 2019-05-13 DIAGNOSIS — R293 Abnormal posture: Secondary | ICD-10-CM | POA: Diagnosis not present

## 2019-05-13 DIAGNOSIS — M542 Cervicalgia: Secondary | ICD-10-CM | POA: Diagnosis not present

## 2019-05-13 DIAGNOSIS — M545 Low back pain: Secondary | ICD-10-CM | POA: Diagnosis not present

## 2019-05-14 DIAGNOSIS — M533 Sacrococcygeal disorders, not elsewhere classified: Secondary | ICD-10-CM | POA: Diagnosis not present

## 2019-05-14 DIAGNOSIS — M542 Cervicalgia: Secondary | ICD-10-CM | POA: Diagnosis not present

## 2019-05-14 DIAGNOSIS — M5442 Lumbago with sciatica, left side: Secondary | ICD-10-CM | POA: Diagnosis not present

## 2019-05-14 DIAGNOSIS — M7918 Myalgia, other site: Secondary | ICD-10-CM | POA: Diagnosis not present

## 2019-05-15 DIAGNOSIS — R293 Abnormal posture: Secondary | ICD-10-CM | POA: Diagnosis not present

## 2019-05-15 DIAGNOSIS — M2560 Stiffness of unspecified joint, not elsewhere classified: Secondary | ICD-10-CM | POA: Diagnosis not present

## 2019-05-15 DIAGNOSIS — M545 Low back pain: Secondary | ICD-10-CM | POA: Diagnosis not present

## 2019-05-15 DIAGNOSIS — M542 Cervicalgia: Secondary | ICD-10-CM | POA: Diagnosis not present

## 2019-05-22 DIAGNOSIS — R293 Abnormal posture: Secondary | ICD-10-CM | POA: Diagnosis not present

## 2019-05-22 DIAGNOSIS — M542 Cervicalgia: Secondary | ICD-10-CM | POA: Diagnosis not present

## 2019-05-22 DIAGNOSIS — M2560 Stiffness of unspecified joint, not elsewhere classified: Secondary | ICD-10-CM | POA: Diagnosis not present

## 2019-05-22 DIAGNOSIS — M545 Low back pain: Secondary | ICD-10-CM | POA: Diagnosis not present

## 2019-05-24 DIAGNOSIS — R293 Abnormal posture: Secondary | ICD-10-CM | POA: Diagnosis not present

## 2019-05-24 DIAGNOSIS — M542 Cervicalgia: Secondary | ICD-10-CM | POA: Diagnosis not present

## 2019-05-24 DIAGNOSIS — M2560 Stiffness of unspecified joint, not elsewhere classified: Secondary | ICD-10-CM | POA: Diagnosis not present

## 2019-05-24 DIAGNOSIS — M545 Low back pain: Secondary | ICD-10-CM | POA: Diagnosis not present

## 2019-06-05 DIAGNOSIS — R293 Abnormal posture: Secondary | ICD-10-CM | POA: Diagnosis not present

## 2019-06-05 DIAGNOSIS — M542 Cervicalgia: Secondary | ICD-10-CM | POA: Diagnosis not present

## 2019-06-05 DIAGNOSIS — M545 Low back pain: Secondary | ICD-10-CM | POA: Diagnosis not present

## 2019-06-05 DIAGNOSIS — M2560 Stiffness of unspecified joint, not elsewhere classified: Secondary | ICD-10-CM | POA: Diagnosis not present

## 2019-06-07 DIAGNOSIS — R293 Abnormal posture: Secondary | ICD-10-CM | POA: Diagnosis not present

## 2019-06-07 DIAGNOSIS — M542 Cervicalgia: Secondary | ICD-10-CM | POA: Diagnosis not present

## 2019-06-07 DIAGNOSIS — M545 Low back pain: Secondary | ICD-10-CM | POA: Diagnosis not present

## 2019-06-07 DIAGNOSIS — M2560 Stiffness of unspecified joint, not elsewhere classified: Secondary | ICD-10-CM | POA: Diagnosis not present

## 2019-06-11 DIAGNOSIS — M5442 Lumbago with sciatica, left side: Secondary | ICD-10-CM | POA: Diagnosis not present

## 2019-06-11 DIAGNOSIS — M533 Sacrococcygeal disorders, not elsewhere classified: Secondary | ICD-10-CM | POA: Diagnosis not present

## 2019-06-11 DIAGNOSIS — M542 Cervicalgia: Secondary | ICD-10-CM | POA: Diagnosis not present

## 2019-06-11 DIAGNOSIS — M7918 Myalgia, other site: Secondary | ICD-10-CM | POA: Diagnosis not present

## 2019-06-13 DIAGNOSIS — R293 Abnormal posture: Secondary | ICD-10-CM | POA: Diagnosis not present

## 2019-06-13 DIAGNOSIS — M542 Cervicalgia: Secondary | ICD-10-CM | POA: Diagnosis not present

## 2019-06-13 DIAGNOSIS — M2560 Stiffness of unspecified joint, not elsewhere classified: Secondary | ICD-10-CM | POA: Diagnosis not present

## 2019-06-13 DIAGNOSIS — M545 Low back pain: Secondary | ICD-10-CM | POA: Diagnosis not present

## 2019-06-14 ENCOUNTER — Ambulatory Visit (INDEPENDENT_AMBULATORY_CARE_PROVIDER_SITE_OTHER): Payer: BC Managed Care – PPO | Admitting: Nurse Practitioner

## 2019-06-14 ENCOUNTER — Encounter: Payer: Self-pay | Admitting: Nurse Practitioner

## 2019-06-14 ENCOUNTER — Other Ambulatory Visit: Payer: Self-pay

## 2019-06-14 VITALS — BP 124/80 | HR 99 | Temp 97.1°F | Resp 14 | Ht 68.0 in | Wt 177.0 lb

## 2019-06-14 DIAGNOSIS — Z794 Long term (current) use of insulin: Secondary | ICD-10-CM

## 2019-06-14 DIAGNOSIS — E1165 Type 2 diabetes mellitus with hyperglycemia: Secondary | ICD-10-CM

## 2019-06-14 DIAGNOSIS — E785 Hyperlipidemia, unspecified: Secondary | ICD-10-CM

## 2019-06-14 DIAGNOSIS — M5090 Cervical disc disorder, unspecified, unspecified cervical region: Secondary | ICD-10-CM

## 2019-06-14 DIAGNOSIS — R5383 Other fatigue: Secondary | ICD-10-CM

## 2019-06-14 DIAGNOSIS — M4800 Spinal stenosis, site unspecified: Secondary | ICD-10-CM

## 2019-06-14 DIAGNOSIS — F321 Major depressive disorder, single episode, moderate: Secondary | ICD-10-CM

## 2019-06-14 DIAGNOSIS — G894 Chronic pain syndrome: Secondary | ICD-10-CM

## 2019-06-14 DIAGNOSIS — M4802 Spinal stenosis, cervical region: Secondary | ICD-10-CM | POA: Diagnosis not present

## 2019-06-14 DIAGNOSIS — M2560 Stiffness of unspecified joint, not elsewhere classified: Secondary | ICD-10-CM

## 2019-06-14 MED ORDER — ATORVASTATIN CALCIUM 20 MG PO TABS: 20.0000 mg | ORAL_TABLET | Freq: Every day | ORAL | 1 refills | Status: DC

## 2019-06-14 MED ORDER — DULOXETINE HCL 30 MG PO CPEP: ORAL_CAPSULE | ORAL | 1 refills | Status: DC
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
## 2019-06-14 NOTE — Patient Instructions (Addendum)
-   Please start taking cymbalta as directed for pain and depression.  -If you have not heard anything from my staff in a week about any orders/referrals/studies from today, please contact us here to follow-up [phone] - Follow up with Dr. Gabriel Carina for diabetes

## 2019-06-15 LAB — CBC WITH DIFFERENTIAL/PLATELET
Absolute Monocytes: 826 cells/uL (ref 200–950)
Basophils Absolute: 59 cells/uL (ref 0–200)
Basophils Relative: 0.5 %
Eosinophils Absolute: 271 cells/uL (ref 15–500)
Eosinophils Relative: 2.3 %
HCT: 46.9 % (ref 38.5–50.0)
Hemoglobin: 16 g/dL (ref 13.2–17.1)
Lymphs Abs: 4118 cells/uL — ABNORMAL HIGH (ref 850–3900)
MCH: 30.3 pg (ref 27.0–33.0)
MCHC: 34.1 g/dL (ref 32.0–36.0)
MCV: 88.8 fL (ref 80.0–100.0)
MPV: 11.2 fL (ref 7.5–12.5)
Monocytes Relative: 7 %
Neutro Abs: 6525 cells/uL (ref 1500–7800)
Neutrophils Relative %: 55.3 %
Platelets: 268 10*3/uL (ref 140–400)
RBC: 5.28 10*6/uL (ref 4.20–5.80)
RDW: 13.1 % (ref 11.0–15.0)
Total Lymphocyte: 34.9 %
WBC: 11.8 10*3/uL — ABNORMAL HIGH (ref 3.8–10.8)

## 2019-06-15 LAB — COMPLETE METABOLIC PANEL WITH GFR
AG Ratio: 1.4 (calc) (ref 1.0–2.5)
ALT: 30 U/L (ref 9–46)
AST: 16 U/L (ref 10–40)
Albumin: 4 g/dL (ref 3.6–5.1)
Alkaline phosphatase (APISO): 99 U/L (ref 36–130)
BUN: 11 mg/dL (ref 7–25)
CO2: 24 mmol/L (ref 20–32)
Calcium: 9.4 mg/dL (ref 8.6–10.3)
Chloride: 100 mmol/L (ref 98–110)
Creat: 0.87 mg/dL (ref 0.60–1.35)
GFR, Est African American: 122 mL/min/{1.73_m2} (ref >=60)
GFR, Est Non African American: 105 mL/min/{1.73_m2} (ref >=60)
Globulin: 2.8 g/dL (calc) (ref 1.9–3.7)
Glucose, Bld: 368 mg/dL — ABNORMAL HIGH (ref 65–99)
Potassium: 3.8 mmol/L (ref 3.5–5.3)
Sodium: 135 mmol/L (ref 135–146)
Total Bilirubin: 0.6 mg/dL (ref 0.2–1.2)
Total Protein: 6.8 g/dL (ref 6.1–8.1)

## 2019-06-15 LAB — HEMOGLOBIN A1C: Hgb A1c MFr Bld: >14 % of total Hgb — ABNORMAL HIGH (ref <5.7)

## 2019-06-15 LAB — LIPID PANEL
Cholesterol: 165 mg/dL (ref <200)
HDL: 30 mg/dL — ABNORMAL LOW (ref >=40)
LDL Cholesterol (Calc): 99 mg/dL (calc)
Non-HDL Cholesterol (Calc): 135 mg/dL (calc) — ABNORMAL HIGH (ref <130)
Total CHOL/HDL Ratio: 5.5 (calc) — ABNORMAL HIGH (ref <5.0)
Triglycerides: 251 mg/dL — ABNORMAL HIGH (ref <150)

## 2019-06-15 LAB — C-REACTIVE PROTEIN: CRP: 9.7 mg/L — ABNORMAL HIGH (ref <8.0)

## 2019-06-15 LAB — SEDIMENTATION RATE: Sed Rate: 6 mm/h (ref 0–15)

## 2019-06-19 ENCOUNTER — Telehealth: Payer: Self-pay | Admitting: Family Medicine

## 2019-06-19 ENCOUNTER — Encounter: Payer: Self-pay | Admitting: Nurse Practitioner

## 2019-06-19 DIAGNOSIS — M2560 Stiffness of unspecified joint, not elsewhere classified: Secondary | ICD-10-CM | POA: Diagnosis not present

## 2019-06-19 DIAGNOSIS — R293 Abnormal posture: Secondary | ICD-10-CM | POA: Diagnosis not present

## 2019-06-19 DIAGNOSIS — M5090 Cervical disc disorder, unspecified, unspecified cervical region: Secondary | ICD-10-CM

## 2019-06-19 DIAGNOSIS — M542 Cervicalgia: Secondary | ICD-10-CM | POA: Diagnosis not present

## 2019-06-19 DIAGNOSIS — M545 Low back pain: Secondary | ICD-10-CM | POA: Diagnosis not present

## 2019-06-19 NOTE — Telephone Encounter (Signed)
Pt came in on 06-19-2019 asking for note and is asking for a referral to [facility]. Said that the place Benjamine Mola told him to call [facility] he said is the same pain management he was going to. Please advise him

## 2019-06-20 NOTE — Telephone Encounter (Signed)
Placed order for [facility]- please inform him.

## 2019-06-24 ENCOUNTER — Other Ambulatory Visit: Payer: Self-pay | Admitting: Nurse Practitioner

## 2019-06-24 DIAGNOSIS — R7982 Elevated C-reactive protein (CRP): Secondary | ICD-10-CM

## 2019-06-24 DIAGNOSIS — M2560 Stiffness of unspecified joint, not elsewhere classified: Secondary | ICD-10-CM

## 2019-06-24 DIAGNOSIS — D72820 Lymphocytosis (symptomatic): Secondary | ICD-10-CM

## 2019-07-02 ENCOUNTER — Inpatient Hospital Stay: Payer: BC Managed Care – PPO

## 2019-07-02 ENCOUNTER — Other Ambulatory Visit: Payer: Self-pay

## 2019-07-02 ENCOUNTER — Encounter: Payer: Self-pay | Admitting: Oncology

## 2019-07-02 ENCOUNTER — Inpatient Hospital Stay: Payer: BC Managed Care – PPO | Attending: Oncology | Admitting: Oncology

## 2019-07-02 VITALS — BP 161/96 | HR 103 | Temp 97.5°F | Resp 16 | Wt 178.0 lb

## 2019-07-02 DIAGNOSIS — E119 Type 2 diabetes mellitus without complications: Secondary | ICD-10-CM | POA: Insufficient documentation

## 2019-07-02 DIAGNOSIS — E785 Hyperlipidemia, unspecified: Secondary | ICD-10-CM | POA: Diagnosis not present

## 2019-07-02 DIAGNOSIS — M2550 Pain in unspecified joint: Secondary | ICD-10-CM | POA: Insufficient documentation

## 2019-07-02 DIAGNOSIS — G8929 Other chronic pain: Secondary | ICD-10-CM | POA: Insufficient documentation

## 2019-07-02 DIAGNOSIS — I10 Essential (primary) hypertension: Secondary | ICD-10-CM | POA: Diagnosis not present

## 2019-07-02 DIAGNOSIS — F17210 Nicotine dependence, cigarettes, uncomplicated: Secondary | ICD-10-CM

## 2019-07-02 DIAGNOSIS — M542 Cervicalgia: Secondary | ICD-10-CM | POA: Diagnosis not present

## 2019-07-02 DIAGNOSIS — R5382 Chronic fatigue, unspecified: Secondary | ICD-10-CM | POA: Insufficient documentation

## 2019-07-02 DIAGNOSIS — D72820 Lymphocytosis (symptomatic): Secondary | ICD-10-CM | POA: Diagnosis not present

## 2019-07-02 LAB — TECHNOLOGIST SMEAR REVIEW: Plt Morphology: ADEQUATE

## 2019-07-02 LAB — CBC WITH DIFFERENTIAL/PLATELET
Abs Immature Granulocytes: 0.04 10*3/uL (ref 0.00–0.07)
Basophils Absolute: 0.1 10*3/uL (ref 0.0–0.1)
Basophils Relative: 1 %
Eosinophils Absolute: 0.2 10*3/uL (ref 0.0–0.5)
Eosinophils Relative: 2 %
HCT: 47.8 % (ref 39.0–52.0)
Hemoglobin: 16.5 g/dL (ref 13.0–17.0)
Immature Granulocytes: 0 %
Lymphocytes Relative: 25 %
Lymphs Abs: 2.5 10*3/uL (ref 0.7–4.0)
MCH: 29 pg (ref 26.0–34.0)
MCHC: 34.5 g/dL (ref 30.0–36.0)
MCV: 84 fL (ref 80.0–100.0)
Monocytes Absolute: 0.6 10*3/uL (ref 0.1–1.0)
Monocytes Relative: 6 %
Neutro Abs: 6.8 10*3/uL (ref 1.7–7.7)
Neutrophils Relative %: 66 %
Platelets: 338 10*3/uL (ref 150–400)
RBC: 5.69 MIL/uL (ref 4.22–5.81)
RDW: 12.3 % (ref 11.5–15.5)
WBC: 10.2 10*3/uL (ref 4.0–10.5)
nRBC: 0 % (ref 0.0–0.2)

## 2019-07-03 ENCOUNTER — Encounter: Payer: Self-pay | Admitting: Oncology
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
## 2019-07-04 LAB — COMP PANEL: LEUKEMIA/LYMPHOMA

## 2019-07-10 DIAGNOSIS — M545 Low back pain: Secondary | ICD-10-CM | POA: Diagnosis not present

## 2019-07-10 DIAGNOSIS — G4709 Other insomnia: Secondary | ICD-10-CM | POA: Diagnosis not present

## 2019-07-10 DIAGNOSIS — Z79899 Other long term (current) drug therapy: Secondary | ICD-10-CM | POA: Diagnosis not present

## 2019-07-10 DIAGNOSIS — M47812 Spondylosis without myelopathy or radiculopathy, cervical region: Secondary | ICD-10-CM | POA: Diagnosis not present

## 2019-07-10 DIAGNOSIS — R7982 Elevated C-reactive protein (CRP): Secondary | ICD-10-CM | POA: Insufficient documentation

## 2019-07-10 DIAGNOSIS — G4700 Insomnia, unspecified: Secondary | ICD-10-CM | POA: Insufficient documentation

## 2019-07-10 DIAGNOSIS — G8929 Other chronic pain: Secondary | ICD-10-CM | POA: Diagnosis not present

## 2019-07-15 ENCOUNTER — Inpatient Hospital Stay (HOSPITAL_BASED_OUTPATIENT_CLINIC_OR_DEPARTMENT_OTHER): Payer: BC Managed Care – PPO | Admitting: Oncology

## 2019-07-15 ENCOUNTER — Encounter: Payer: Self-pay | Admitting: Oncology

## 2019-07-15 ENCOUNTER — Other Ambulatory Visit: Payer: Self-pay

## 2019-07-15 ENCOUNTER — Encounter: Payer: Self-pay | Admitting: *Deleted

## 2019-07-15 VITALS — BP 165/110 | HR 102 | Temp 97.4°F | Ht 68.0 in | Wt 173.0 lb

## 2019-07-15 DIAGNOSIS — E785 Hyperlipidemia, unspecified: Secondary | ICD-10-CM | POA: Diagnosis not present

## 2019-07-15 DIAGNOSIS — D72820 Lymphocytosis (symptomatic): Secondary | ICD-10-CM

## 2019-07-15 DIAGNOSIS — R5382 Chronic fatigue, unspecified: Secondary | ICD-10-CM | POA: Diagnosis not present

## 2019-07-15 DIAGNOSIS — G8929 Other chronic pain: Secondary | ICD-10-CM | POA: Diagnosis not present

## 2019-07-15 DIAGNOSIS — F17210 Nicotine dependence, cigarettes, uncomplicated: Secondary | ICD-10-CM | POA: Diagnosis not present

## 2019-07-15 DIAGNOSIS — M2550 Pain in unspecified joint: Secondary | ICD-10-CM | POA: Diagnosis not present

## 2019-07-15 DIAGNOSIS — E119 Type 2 diabetes mellitus without complications: Secondary | ICD-10-CM | POA: Diagnosis not present

## 2019-07-15 DIAGNOSIS — M542 Cervicalgia: Secondary | ICD-10-CM | POA: Diagnosis not present

## 2019-07-15 DIAGNOSIS — I10 Essential (primary) hypertension: Secondary | ICD-10-CM | POA: Diagnosis not present
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
## 2019-07-15 NOTE — Progress Notes (Signed)
Patient stated that he had been having pain all over his body and that his left shoulder had been bothering the worst.
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
## 2019-07-18 ENCOUNTER — Encounter: Payer: Self-pay | Admitting: Nurse Practitioner

## 2019-07-18 ENCOUNTER — Ambulatory Visit (INDEPENDENT_AMBULATORY_CARE_PROVIDER_SITE_OTHER): Payer: BC Managed Care – PPO | Admitting: Nurse Practitioner

## 2019-07-18 DIAGNOSIS — F331 Major depressive disorder, recurrent, moderate: Secondary | ICD-10-CM

## 2019-07-18 DIAGNOSIS — G4701 Insomnia due to medical condition: Secondary | ICD-10-CM

## 2019-07-18 DIAGNOSIS — N521 Erectile dysfunction due to diseases classified elsewhere: Secondary | ICD-10-CM | POA: Diagnosis not present

## 2019-07-18 DIAGNOSIS — Z794 Long term (current) use of insulin: Secondary | ICD-10-CM

## 2019-07-18 DIAGNOSIS — G894 Chronic pain syndrome: Secondary | ICD-10-CM

## 2019-07-18 DIAGNOSIS — E1165 Type 2 diabetes mellitus with hyperglycemia: Secondary | ICD-10-CM

## 2019-07-18 MED ORDER — DULOXETINE HCL 60 MG PO CPEP: 60.0000 mg | ORAL_CAPSULE | Freq: Every day | ORAL | 0 refills | Status: DC

## 2019-07-18 MED ORDER — SILDENAFIL CITRATE 25 MG PO TABS: 25.0000 mg | ORAL_TABLET | Freq: Every day | ORAL | 0 refills | Status: DC | PRN

## 2019-07-25 DIAGNOSIS — M159 Polyosteoarthritis, unspecified: Secondary | ICD-10-CM | POA: Insufficient documentation

## 2019-07-25 DIAGNOSIS — M8949 Other hypertrophic osteoarthropathy, multiple sites: Secondary | ICD-10-CM | POA: Insufficient documentation

## 2019-07-25 DIAGNOSIS — M47812 Spondylosis without myelopathy or radiculopathy, cervical region: Secondary | ICD-10-CM | POA: Diagnosis not present

## 2019-07-25 DIAGNOSIS — M797 Fibromyalgia: Secondary | ICD-10-CM | POA: Diagnosis not present

## 2019-08-16 ENCOUNTER — Ambulatory Visit: Payer: BC Managed Care – PPO | Attending: Internal Medicine | Admitting: Physical Therapy

## 2019-08-16 ENCOUNTER — Encounter: Payer: Self-pay | Admitting: Physical Therapy

## 2019-08-16 ENCOUNTER — Other Ambulatory Visit: Payer: Self-pay

## 2019-08-16 DIAGNOSIS — M5441 Lumbago with sciatica, right side: Secondary | ICD-10-CM | POA: Diagnosis not present

## 2019-08-16 DIAGNOSIS — M546 Pain in thoracic spine: Secondary | ICD-10-CM | POA: Insufficient documentation

## 2019-08-16 DIAGNOSIS — M542 Cervicalgia: Secondary | ICD-10-CM | POA: Insufficient documentation

## 2019-08-16 DIAGNOSIS — G8929 Other chronic pain: Secondary | ICD-10-CM | POA: Diagnosis not present
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
## 2019-08-29 ENCOUNTER — Ambulatory Visit (INDEPENDENT_AMBULATORY_CARE_PROVIDER_SITE_OTHER): Payer: BC Managed Care – PPO | Admitting: Family Medicine

## 2019-08-29 ENCOUNTER — Encounter: Payer: Self-pay | Admitting: Family Medicine

## 2019-08-29 ENCOUNTER — Other Ambulatory Visit: Payer: Self-pay

## 2019-08-29 VITALS — BP 112/84 | HR 100 | Temp 97.5°F | Resp 16 | Ht 68.0 in | Wt 175.0 lb

## 2019-08-29 DIAGNOSIS — Z23 Encounter for immunization: Secondary | ICD-10-CM | POA: Diagnosis not present

## 2019-08-29 DIAGNOSIS — M797 Fibromyalgia: Secondary | ICD-10-CM

## 2019-08-29 DIAGNOSIS — I7090 Unspecified atherosclerosis: Secondary | ICD-10-CM | POA: Diagnosis not present

## 2019-08-29 DIAGNOSIS — R7982 Elevated C-reactive protein (CRP): Secondary | ICD-10-CM

## 2019-08-29 DIAGNOSIS — R5383 Other fatigue: Secondary | ICD-10-CM | POA: Diagnosis not present

## 2019-08-29 DIAGNOSIS — Z599 Problem related to housing and economic circumstances, unspecified: Secondary | ICD-10-CM

## 2019-08-29 DIAGNOSIS — Z598 Other problems related to housing and economic circumstances: Secondary | ICD-10-CM | POA: Diagnosis not present

## 2019-08-29 DIAGNOSIS — E1165 Type 2 diabetes mellitus with hyperglycemia: Secondary | ICD-10-CM | POA: Diagnosis not present

## 2019-08-29 DIAGNOSIS — Z794 Long term (current) use of insulin: Secondary | ICD-10-CM

## 2019-08-29 MED ORDER — MELOXICAM 15 MG PO TABS: 15.0000 mg | ORAL_TABLET | Freq: Every day | ORAL | 0 refills | Status: DC

## 2019-08-29 MED ORDER — DULOXETINE HCL 20 MG PO CPEP: ORAL_CAPSULE | ORAL | 1 refills | Status: DC

## 2019-08-29 NOTE — Patient Instructions (Signed)
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
## 2019-08-31 LAB — COMPLETE METABOLIC PANEL WITH GFR
AG Ratio: 1.3 (calc) (ref 1.0–2.5)
ALT: 29 U/L (ref 9–46)
AST: 18 U/L (ref 10–40)
Albumin: 4 g/dL (ref 3.6–5.1)
Alkaline phosphatase (APISO): 112 U/L (ref 36–130)
BUN: 14 mg/dL (ref 7–25)
CO2: 29 mmol/L (ref 20–32)
Calcium: 9.7 mg/dL (ref 8.6–10.3)
Chloride: 99 mmol/L (ref 98–110)
Creat: 0.87 mg/dL (ref 0.60–1.35)
GFR, Est African American: 122 mL/min/{1.73_m2} (ref >=60)
GFR, Est Non African American: 105 mL/min/{1.73_m2} (ref >=60)
Globulin: 3 g/dL (calc) (ref 1.9–3.7)
Glucose, Bld: 287 mg/dL — ABNORMAL HIGH (ref 65–99)
Potassium: 4.4 mmol/L (ref 3.5–5.3)
Sodium: 136 mmol/L (ref 135–146)
Total Bilirubin: 0.7 mg/dL (ref 0.2–1.2)
Total Protein: 7 g/dL (ref 6.1–8.1)

## 2019-08-31 LAB — TESTOSTERONE TOTAL,FREE,BIO, MALES
Albumin: 4 g/dL (ref 3.6–5.1)
Sex Hormone Binding: 19 nmol/L (ref 10–50)
Testosterone: 169 ng/dL — ABNORMAL LOW (ref 250–827)

## 2019-08-31 LAB — ANA,IFA RA DIAG PNL W/RFLX TIT/PATN
Anti Nuclear Antibody (ANA): NEGATIVE
Cyclic Citrullin Peptide Ab: <16 UNITS
Rheumatoid fact SerPl-aCnc: <14 IU/mL (ref <14)

## 2019-08-31 LAB — CBC WITH DIFFERENTIAL/PLATELET
Absolute Monocytes: 800 cells/uL (ref 200–950)
Basophils Absolute: 86 cells/uL (ref 0–200)
Basophils Relative: 0.7 %
Eosinophils Absolute: 357 cells/uL (ref 15–500)
Eosinophils Relative: 2.9 %
HCT: 49 % (ref 38.5–50.0)
Hemoglobin: 16.5 g/dL (ref 13.2–17.1)
Lymphs Abs: 3346 cells/uL (ref 850–3900)
MCH: 29.5 pg (ref 27.0–33.0)
MCHC: 33.7 g/dL (ref 32.0–36.0)
MCV: 87.5 fL (ref 80.0–100.0)
MPV: 10.5 fL (ref 7.5–12.5)
Monocytes Relative: 6.5 %
Neutro Abs: 7712 cells/uL (ref 1500–7800)
Neutrophils Relative %: 62.7 %
Platelets: 359 10*3/uL (ref 140–400)
RBC: 5.6 10*6/uL (ref 4.20–5.80)
RDW: 12.7 % (ref 11.0–15.0)
Total Lymphocyte: 27.2 %
WBC: 12.3 10*3/uL — ABNORMAL HIGH (ref 3.8–10.8)

## 2019-08-31 LAB — LIPID PANEL
Cholesterol: 196 mg/dL (ref <200)
HDL: 36 mg/dL — ABNORMAL LOW (ref >=40)
LDL Cholesterol (Calc): 134 mg/dL (calc) — ABNORMAL HIGH
Non-HDL Cholesterol (Calc): 160 mg/dL (calc) — ABNORMAL HIGH (ref <130)
Total CHOL/HDL Ratio: 5.4 (calc) — ABNORMAL HIGH (ref <5.0)
Triglycerides: 132 mg/dL (ref <150)

## 2019-08-31 LAB — SEDIMENTATION RATE: Sed Rate: 11 mm/h (ref 0–15)

## 2019-08-31 LAB — TSH: TSH: 0.42 mIU/L (ref 0.40–4.50)

## 2019-08-31 LAB — C-REACTIVE PROTEIN: CRP: 18.8 mg/L — ABNORMAL HIGH (ref <8.0)

## 2019-08-31 NOTE — Addendum Note (Signed)
Addended by: Pura Spice on: 08/31/2019 06:39 PM   Modules accepted: Orders

## 2019-09-02 ENCOUNTER — Other Ambulatory Visit: Payer: Self-pay | Admitting: Family Medicine

## 2019-09-02 DIAGNOSIS — I7090 Unspecified atherosclerosis: Secondary | ICD-10-CM

## 2019-09-02 DIAGNOSIS — E1169 Type 2 diabetes mellitus with other specified complication: Secondary | ICD-10-CM

## 2019-09-02 DIAGNOSIS — R7989 Other specified abnormal findings of blood chemistry: Secondary | ICD-10-CM

## 2019-09-02 MED ORDER — ATORVASTATIN CALCIUM 40 MG PO TABS: 40.0000 mg | ORAL_TABLET | Freq: Every day | ORAL | 3 refills | Status: DC

## 2019-09-03 ENCOUNTER — Telehealth: Payer: Self-pay | Admitting: Family Medicine

## 2019-09-03 NOTE — Chronic Care Management (AMB) (Signed)
°  Care Management   Outreach Note  09/03/2019 Name: Henry Fuller MRN: [id number] DOB: 02-Apr-1976  Referred by: Hubbard Hartshorn, FNP Reason for referral : Care Coordination (Initial CCM outreach was unsuccessful )   An unsuccessful telephone outreach was attempted today. The patient was referred to the case management team by for assistance with care management and care coordination.   Follow Up Plan: A HIPPA compliant phone message was left for the patient providing contact information and requesting a return call.  The care management team will reach out to the patient again over the next 7 days.  If patient returns call to provider office, please advise to call Embedded Care Management Care Guide Vincenza Hews at [place]  [email or web address]   ??[id number]

## 2019-09-11 NOTE — Chronic Care Management (AMB) (Signed)
°  Care Management   Note  09/11/2019 Name: Henry Fuller MRN: [id number] DOB: 17-Jan-1976  Henry Fuller is a 43 y.o. year old male who is a primary care patient of Hubbard Hartshorn, FNP. I reached out to [name] by phone today in response to a referral sent by [name].    [name] was given information about care management services today including:  1. Care management services include personalized support from designated clinical staff supervised by his physician, including individualized plan of care and coordination with other care providers 2. 24/7 contact phone numbers for assistance for urgent and routine care needs. 3. The patient may stop care management services at any time by phone call to the office staff.  Patient agreed to services and verbal consent obtained.   Follow up plan: Telephone appointment with CCM team member scheduled for: 10/14/2019  [place]  [email or web address]   ??[id number]

## 2019-09-18 ENCOUNTER — Encounter: Payer: Self-pay | Admitting: Urology

## 2019-09-18 ENCOUNTER — Other Ambulatory Visit: Payer: Self-pay

## 2019-09-18 ENCOUNTER — Encounter: Payer: Self-pay | Admitting: Family Medicine

## 2019-09-18 ENCOUNTER — Ambulatory Visit (INDEPENDENT_AMBULATORY_CARE_PROVIDER_SITE_OTHER): Payer: BC Managed Care – PPO | Admitting: Urology

## 2019-09-18 VITALS — BP 123/86 | HR 105 | Ht 68.0 in | Wt 175.2 lb

## 2019-09-18 DIAGNOSIS — E291 Testicular hypofunction: Secondary | ICD-10-CM | POA: Diagnosis not present

## 2019-09-18 DIAGNOSIS — Z125 Encounter for screening for malignant neoplasm of prostate: Secondary | ICD-10-CM

## 2019-09-18 MED ORDER — TADALAFIL 20 MG PO TABS: 20.0000 mg | ORAL_TABLET | Freq: Every day | ORAL | 0 refills | Status: DC | PRN
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
## 2019-09-19 ENCOUNTER — Telehealth: Payer: Self-pay | Admitting: Urology

## 2019-09-19 LAB — PSA: Prostate Specific Ag, Serum: 0.4 ng/mL (ref 0.0–4.0)

## 2019-09-19 LAB — PROLACTIN: Prolactin: 5.9 ng/mL (ref 4.0–15.2)

## 2019-09-19 LAB — LUTEINIZING HORMONE: LH: 3.2 m[IU]/mL (ref 1.7–8.6)

## 2019-09-19 LAB — TESTOSTERONE: Testosterone: 240 ng/dL — ABNORMAL LOW (ref 264–916)

## 2019-09-19 NOTE — Telephone Encounter (Signed)
Called pt informed him of the information below. Pt gave verbal understanding, he would like to try Clomid, please send RX to [facility], pharmacy updated.

## 2019-09-19 NOTE — Telephone Encounter (Signed)
Repeat testosterone level remains low at 240.  He was interested in Henry Fuller.  His LH was also low normal and he would also be a candidate for oral Clomid which is effective at increasing testosterone levels.  If he wants to try this first I can send in an Rx and see back in approximately 6 weeks.  This is an off label use and typically not covered by insurance but the medication is generic and inexpensive, especially with a good Rx coupon.

## 2019-09-20 ENCOUNTER — Telehealth: Payer: BC Managed Care – PPO

## 2019-09-20 ENCOUNTER — Ambulatory Visit: Payer: Self-pay

## 2019-09-20 MED ORDER — CLOMIPHENE CITRATE 50 MG PO TABS: 25.0000 mg | ORAL_TABLET | Freq: Every day | ORAL | 0 refills | Status: DC

## 2019-09-20 NOTE — Telephone Encounter (Signed)
Rx sent.  Needs 6-week follow-up with testosterone level prior

## 2019-09-20 NOTE — Chronic Care Management (AMB) (Signed)
  Chronic Care Management   Outreach Note  09/20/2019 Name: Henry Fuller MRN: [id number] DOB: Oct 28, 1976    An unsuccessful telephone outreach was attempted today. Henry Fuller was referred to the case management team for assistance with care management and care coordination. Left a HIPAA compliant voice message along with contact information requesting a return call.  Follow Up Plan: The care management team will reach out to Henry Fuller again within the next two weeks.    [facility] [phone]

## 2019-09-23 NOTE — Telephone Encounter (Signed)
Appt was scheduled at the time pt was called -OG

## 2019-09-24 ENCOUNTER — Encounter: Payer: Self-pay | Admitting: Physical Therapy

## 2019-10-04 ENCOUNTER — Ambulatory Visit: Payer: Self-pay

## 2019-10-04 ENCOUNTER — Telehealth: Payer: Self-pay

## 2019-10-04 NOTE — Chronic Care Management (AMB) (Signed)
  Chronic Care Management   Outreach Note  10/04/2019 Name: Henry Fuller MRN: [id number] DOB: 1976-06-09  Primary Care Provider: Hubbard Hartshorn, FNP Reason for referral : Chronic Care Management   A second unsuccessful telephone outreach was attempted today. Henry Fuller was referred to the case management team for assistance with care management and care coordination. I left a HIPAA compliant voice message along with contact information requesting a return call.   Follow Up Plan: The care management team will reach out to Henry Fuller again within the next two to three weeks.   [facility] [phone]

## 2019-10-14 ENCOUNTER — Telehealth: Payer: BC Managed Care – PPO

## 2019-10-25 ENCOUNTER — Ambulatory Visit (INDEPENDENT_AMBULATORY_CARE_PROVIDER_SITE_OTHER): Payer: BC Managed Care – PPO | Admitting: Urology

## 2019-10-25 ENCOUNTER — Encounter: Payer: Self-pay | Admitting: Urology

## 2019-10-25 ENCOUNTER — Telehealth: Payer: Self-pay

## 2019-10-25 ENCOUNTER — Other Ambulatory Visit: Payer: Self-pay

## 2019-10-25 VITALS — BP 158/98 | HR 102 | Ht 68.0 in | Wt 175.0 lb

## 2019-10-25 DIAGNOSIS — E291 Testicular hypofunction: Secondary | ICD-10-CM | POA: Diagnosis not present

## 2019-10-25 DIAGNOSIS — N529 Male erectile dysfunction, unspecified: Secondary | ICD-10-CM | POA: Insufficient documentation

## 2019-10-25 MED ORDER — SILDENAFIL CITRATE 100 MG PO TABS: ORAL_TABLET | ORAL | 0 refills | Status: DC

## 2019-10-25 NOTE — Progress Notes (Signed)
10/25/2019 11:46 AM   Henry Fuller 1976/11/13 [id number]  Referring provider: Hubbard Hartshorn, [place],  [place] [address]  Chief Complaint  Patient presents with  . Hypogonadism    HPI: 43 y.o. male presents for follow-up of hypogonadism and erectile dysfunction.  He elected a trial of Clomid and presents for a 6-week follow-up.  He has not seen any significant improvement in his energy level or libido.  He was given a trial of tadalafil 20 mg and states this was not effective.  He states a friend gave him a 100 mg sildenafil tablet which he cut in half and that this medication was effective.   PMH: Past Medical History:  Diagnosis Date  . Concussion   . Diabetes mellitus without complication ([facility])    type 2  . Diabetic retinopathy associated with controlled type 2 diabetes mellitus ([facility]) 05/25/2018   Dr. Ellin Mayhew, Sept 2018  . Erectile dysfunction   . GERD (gastroesophageal reflux disease)   . Hyperlipidemia   . Hypertension    controlled on meds  . Hypogonadism in male   . Loose tooth due to trauma    front  . Vitamin D deficiency     Surgical History: Past Surgical History:  Procedure Laterality Date  . COLONOSCOPY WITH PROPOFOL N/A 09/17/2015   Procedure: COLONOSCOPY WITH PROPOFOL;  Surgeon: [name], MD;  Location: [facility];  Service: Endoscopy;  Laterality: N/A;  DIABETIC  . ESOPHAGOGASTRODUODENOSCOPY (EGD) WITH PROPOFOL N/A 09/17/2015   Procedure: ESOPHAGOGASTRODUODENOSCOPY (EGD) WITH PROPOFOL;  Surgeon: [name], MD;  Location: [facility];  Service: Endoscopy;  Laterality: N/A;  . ESOPHAGOGASTRODUODENOSCOPY (EGD) WITH PROPOFOL N/A 10/05/2015   Procedure: ESOPHAGOGASTRODUODENOSCOPY (EGD) WITH PROPOFOL;  Surgeon: [name], MD;  Location: [facility];  Service: Endoscopy;  Laterality: N/A;  . EUS N/A 10/15/2015   Procedure: UPPER ENDOSCOPIC ULTRASOUND (EUS) LINEAR;  Surgeon: [name], MD;  Location: [facility];  Service: Gastroenterology;  Laterality: N/A;  . HERNIA REPAIR  [id number]   umbilical  . VASECTOMY  [id number]    Home Medications:  Allergies as of 10/25/2019      Reactions   Atenolol Other (See Comments)   Chest pressure      Medication List       Accurate as of October 25, 2019 11:46 AM. If you have any questions, ask your nurse or doctor.        aspirin EC 81 MG tablet Take 81 mg by mouth daily.   atorvastatin 40 MG tablet Commonly known as: LIPITOR Take 1 tablet (40 mg total) by mouth daily.   BD Pen Needle Nano U/F 32G X 4 MM Misc Generic drug: Insulin Pen Needle Use as directed Use 4 times daily.   B-D ULTRAFINE III SHORT PEN 31G X 8 MM Misc Generic drug: Insulin Pen Needle   benazepril 20 MG tablet Commonly known as: LOTENSIN Take 1 tablet (20 mg total) by mouth daily.   clomiPHENE 50 MG tablet Commonly known as: CLOMID Take 0.5 tablets (25 mg total) by mouth daily.   DULoxetine 20 MG capsule Commonly known as: CYMBALTA Take 1 tablet once daily at night for 7 days; then increase to 2 tablets once daily at night.   FreeStyle Libre 14 Day Sensor Misc Use 1 kit every 14 (fourteen) days   hydrochlorothiazide 12.5 MG tablet Commonly known as: HYDRODIURIL Take 1 tablet (12.5 mg total) by mouth daily.   Insulin Glargine (2  Unit Dial) 300 UNIT/ML Sopn Inject 50 Units into the skin at bedtime.   meloxicam 15 MG tablet Commonly known as: MOBIC Take 1 tablet (15 mg total) by mouth daily. To replace Ibuprofen   NovoLOG FlexPen 100 UNIT/ML FlexPen Generic drug: insulin aspart Inject 12 Units as directed 3 (three) times daily before meals.   tadalafil 20 MG tablet Commonly known as: CIALIS Take 1 tablet (20 mg total) by mouth daily as needed for erectile dysfunction.       Allergies:  Allergies  Allergen Reactions  . Atenolol Other (See Comments)    Chest pressure    Family History: Family History  Problem  Relation Age of Onset  . Diabetes Mother   . Hypertension Mother   . Cancer Father        colon cancer  . Diabetes Brother   . Hypertension Brother   . Diabetes Maternal Grandmother   . Hypertension Maternal Grandmother   . Heart disease Maternal Grandmother   . Liver disease Maternal Grandfather   . Cancer Paternal Grandmother   . Stroke Paternal Grandfather   . Diabetes Brother   . Hypertension Brother     Social History:  reports that he has been smoking cigarettes. He has a 10.00 pack-year smoking history. He has never used smokeless tobacco. He reports current alcohol use. He reports that he does not use drugs.  ROS: UROLOGY Frequent Urination?: No Hard to postpone urination?: No Burning/pain with urination?: No Get up at night to urinate?: Yes Leakage of urine?: No Urine stream starts and stops?: No Trouble starting stream?: No Do you have to strain to urinate?: No Blood in urine?: No Urinary tract infection?: No Sexually transmitted disease?: No Injury to kidneys or bladder?: No Painful intercourse?: No Weak stream?: No Erection problems?: Yes Penile pain?: No  Gastrointestinal Nausea?: No Vomiting?: No Indigestion/heartburn?: No Diarrhea?: No Constipation?: No  Constitutional Fever: No Night sweats?: No Weight loss?: No Fatigue?: Yes  Skin Skin rash/lesions?: No Itching?: No  Eyes Blurred vision?: No Double vision?: No  Ears/Nose/Throat Sore throat?: No Sinus problems?: No  Hematologic/Lymphatic Swollen glands?: No Easy bruising?: No  Cardiovascular Leg swelling?: No Chest pain?: No  Respiratory Cough?: No Shortness of breath?: No  Endocrine Excessive thirst?: No  Musculoskeletal Back pain?: Yes Joint pain?: Yes  Neurological Headaches?: No Dizziness?: No  Psychologic Depression?: No Anxiety?: No  Physical Exam: BP (!) 158/98 (BP Location: Left Arm, Patient Position: Sitting, Cuff Size: Normal)   Pulse (!) 102   Ht  '5\' 8"'  (1.727 m)   Wt 175 lb (79.4 kg)   BMI 26.61 kg/m   Constitutional:  Alert and oriented, No acute distress. HEENT: [place] AT, moist mucus membranes.  Trachea midline, no masses. Cardiovascular: No clubbing, cyanosis, or edema. Respiratory: Normal respiratory effort, no increased work of breathing. Skin: No rashes, bruises or suspicious lesions. Neurologic: Grossly intact, no focal deficits, moving all 4 extremities. Psychiatric: Normal mood and affect.   Assessment & Plan:    - Hypogonadism in male Testosterone level was ordered.  We discussed that some patients will have improvement in their testosterone levels on Clomid but no improvement in symptoms.  In this case would recommend changing to TRT.  We discussed both topicals and injectable testosterone.  - Erectile dysfunction Rx generic sildenafil 100 mg was sent to pharmacy.   Abbie Sons, [place] [address] [place], [place] [address] [phone]

## 2019-10-26 LAB — TESTOSTERONE: Testosterone: 405 ng/dL (ref 264–916)

## 2019-10-29 ENCOUNTER — Other Ambulatory Visit: Payer: Self-pay

## 2019-10-29 ENCOUNTER — Ambulatory Visit (INDEPENDENT_AMBULATORY_CARE_PROVIDER_SITE_OTHER): Payer: BC Managed Care – PPO | Admitting: Family Medicine

## 2019-10-29 ENCOUNTER — Encounter: Payer: Self-pay | Admitting: Family Medicine

## 2019-10-29 DIAGNOSIS — M150 Primary generalized (osteo)arthritis: Secondary | ICD-10-CM

## 2019-10-29 DIAGNOSIS — M8949 Other hypertrophic osteoarthropathy, multiple sites: Secondary | ICD-10-CM

## 2019-10-29 DIAGNOSIS — I10 Essential (primary) hypertension: Secondary | ICD-10-CM

## 2019-10-29 DIAGNOSIS — R7982 Elevated C-reactive protein (CRP): Secondary | ICD-10-CM

## 2019-10-29 DIAGNOSIS — E1165 Type 2 diabetes mellitus with hyperglycemia: Secondary | ICD-10-CM

## 2019-10-29 DIAGNOSIS — E1169 Type 2 diabetes mellitus with other specified complication: Secondary | ICD-10-CM

## 2019-10-29 DIAGNOSIS — M5431 Sciatica, right side: Secondary | ICD-10-CM | POA: Diagnosis not present

## 2019-10-29 DIAGNOSIS — E291 Testicular hypofunction: Secondary | ICD-10-CM

## 2019-10-29 DIAGNOSIS — M5090 Cervical disc disorder, unspecified, unspecified cervical region: Secondary | ICD-10-CM | POA: Diagnosis not present

## 2019-10-29 DIAGNOSIS — Z794 Long term (current) use of insulin: Secondary | ICD-10-CM | POA: Diagnosis not present

## 2019-10-29 DIAGNOSIS — I7090 Unspecified atherosclerosis: Secondary | ICD-10-CM

## 2019-10-29 DIAGNOSIS — N529 Male erectile dysfunction, unspecified: Secondary | ICD-10-CM

## 2019-10-29 DIAGNOSIS — M159 Polyosteoarthritis, unspecified: Secondary | ICD-10-CM

## 2019-10-29 DIAGNOSIS — E782 Mixed hyperlipidemia: Secondary | ICD-10-CM

## 2019-10-29 MED ORDER — HYDROCHLOROTHIAZIDE 12.5 MG PO TABS: 12.5000 mg | ORAL_TABLET | Freq: Every day | ORAL | 0 refills | Status: DC

## 2019-10-29 MED ORDER — BENAZEPRIL HCL 20 MG PO TABS: 20.0000 mg | ORAL_TABLET | Freq: Every day | ORAL | 0 refills | Status: DC
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
## 2019-10-30 ENCOUNTER — Telehealth: Payer: Self-pay | Admitting: Urology

## 2019-10-30 LAB — HEMOGLOBIN A1C
Hgb A1c MFr Bld: 12.6 % of total Hgb — ABNORMAL HIGH (ref <5.7)
Mean Plasma Glucose: 315 (calc)
eAG (mmol/L): 17.4 (calc)

## 2019-10-30 LAB — C-REACTIVE PROTEIN: CRP: 12.2 mg/L — ABNORMAL HIGH (ref <8.0)

## 2019-10-30 NOTE — Telephone Encounter (Signed)
Testosterone level was much better at 405.  Since his symptoms have not improved would recommend switching to testosterone.  Does he have a preference of topical gels or injections?

## 2019-10-31 NOTE — Telephone Encounter (Signed)
See my chart message

## 2019-11-01 ENCOUNTER — Ambulatory Visit: Payer: Self-pay

## 2019-11-01 ENCOUNTER — Telehealth: Payer: Self-pay

## 2019-11-01 NOTE — Chronic Care Management (AMB) (Signed)
  Chronic Care Management   Outreach Note  11/01/2019 Name: Henry Fuller MRN: [id number] DOB: 08/25/1976  Primary Care Provider: Hubbard Hartshorn, FNP Reason for referral : Chronic Care Management   Third unsuccessful telephone outreach was attempted today. [name] was referred to the care management team for assistance with chronic care management and care coordination. His primary care provider will be notified of our unsuccessful attempts to maintain and establish contact. The care management team will gladly outreach at any time in the future if he is interested in receiving assistance.   Follow Up Plan:  The care management team will gladly follow up with [name] after the primary care provider has a conversation with him regarding recommendation for care management engagement and subsequent re-referral for care management services.   [facility] Care Management [phone]

## 2019-11-05 ENCOUNTER — Telehealth: Payer: Self-pay | Admitting: Urology

## 2019-11-05 MED ORDER — TESTOSTERONE CYPIONATE 200 MG/ML IM SOLN: 200.0000 mg | INTRAMUSCULAR | 0 refills | Status: DC

## 2019-11-05 NOTE — Telephone Encounter (Signed)
Switching from Clomid to testosterone injections. I sent in testosterone Rx to pharmacy. He needs appointments set up for injection training. Thanks

## 2019-11-06 NOTE — Telephone Encounter (Signed)
Left patient mess to call, and my chart notification sent

## 2019-11-20 ENCOUNTER — Telehealth: Payer: Self-pay

## 2019-11-20 NOTE — Telephone Encounter (Signed)
Incoming fax from [employer] denying prior authorization for testosterone. His insurance will only cover if the pt has one non-sexual function symptom such as height loss due to fractures, low bone density, or delayed sexual development.

## 2019-11-20 NOTE — Telephone Encounter (Signed)
See my chart message

## 2019-11-20 NOTE — Telephone Encounter (Signed)
You can give him the option of continuing the Clomid but he may not want to since his symptoms did not improve.

## 2020-01-29 DIAGNOSIS — E113493 Type 2 diabetes mellitus with severe nonproliferative diabetic retinopathy without macular edema, bilateral: Secondary | ICD-10-CM | POA: Diagnosis not present

## 2020-01-29 LAB — HM DIABETES EYE EXAM

## 2020-02-03 ENCOUNTER — Encounter: Payer: Self-pay | Admitting: Family Medicine

## 2020-02-04 DIAGNOSIS — E113493 Type 2 diabetes mellitus with severe nonproliferative diabetic retinopathy without macular edema, bilateral: Secondary | ICD-10-CM | POA: Diagnosis not present

## 2020-02-19 DIAGNOSIS — E1165 Type 2 diabetes mellitus with hyperglycemia: Secondary | ICD-10-CM | POA: Diagnosis not present

## 2020-02-19 DIAGNOSIS — Z794 Long term (current) use of insulin: Secondary | ICD-10-CM | POA: Diagnosis not present

## 2020-02-19 LAB — HEMOGLOBIN A1C: Hemoglobin A1C: 14.4

## 2020-02-25 DIAGNOSIS — E1165 Type 2 diabetes mellitus with hyperglycemia: Secondary | ICD-10-CM | POA: Diagnosis not present

## 2020-02-25 DIAGNOSIS — Z794 Long term (current) use of insulin: Secondary | ICD-10-CM | POA: Diagnosis not present

## 2020-03-05 ENCOUNTER — Telehealth: Payer: Self-pay | Admitting: *Deleted

## 2020-03-05 NOTE — Telephone Encounter (Signed)
Testosterone denied on 03/04/2020.

## 2020-04-14 ENCOUNTER — Ambulatory Visit
Admission: EM | Admit: 2020-04-14 | Discharge: 2020-04-14 | Disposition: A | Discharge status: Home / Self Care | Payer: BC Managed Care – PPO | Attending: Emergency Medicine | Admitting: Emergency Medicine

## 2020-04-14 ENCOUNTER — Ambulatory Visit
Admission: RE | Admit: 2020-04-14 | Discharge: 2020-04-14 | Disposition: A | Discharge status: Home / Self Care | Payer: BC Managed Care – PPO | Source: Ambulatory Visit | Attending: Emergency Medicine | Admitting: Emergency Medicine

## 2020-04-14 ENCOUNTER — Other Ambulatory Visit: Payer: Self-pay

## 2020-04-14 DIAGNOSIS — M79661 Pain in right lower leg: Secondary | ICD-10-CM | POA: Diagnosis not present

## 2020-04-14 NOTE — ED Triage Notes (Signed)
Patient complains of right ankle pain with a possible achilles injury. States that he was walking on Wednesday and his foot just gave way. Reports that he started having pain at the back of foot and ankle yesterday. Patient states that he has noticed a vein more prominent at the back of his ankle as well. Patient states that it is painful to bear weight.

## 2020-04-14 NOTE — ED Notes (Signed)
Patient is scheduled for [facility] U/S at [facility]. Patient aware.

## 2020-04-14 NOTE — ED Provider Notes (Addendum)
Roderic Palau    CSN: [id number] Arrival date & time: 04/14/20  1572      History   Chief Complaint Chief Complaint  Patient presents with   Tendonitis    HPI Henry Fuller is a 44 y.o. male.   Patient presents with pain in his right calf down to his right Achilles tendon since yesterday; it is painful to walk and bear weight.  The only injury the patient can recall is his ankle "gave out" while walking 6 days ago.  Patient is concerned about a prominent tender vein running along his Achilles tendon.  He denies SOB, rash, wounds, numbness, paresthesias,  weakness.   Medical history includes diabetes, atherosclerosis, cervical spondylosis, osteoarthritis, low back pain, and sciatica.  Treatment attempted at home with limited weight bearing.   The history is provided by the patient.    Past Medical History:  Diagnosis Date   Concussion    Diabetes mellitus without complication ([place])    type 2   Diabetic retinopathy associated with controlled type 2 diabetes mellitus ([place]) 05/25/2018   Dr. Ellin Mayhew, Sept 2018   Erectile dysfunction    GERD (gastroesophageal reflux disease)    Hyperlipidemia    Hypertension    controlled on meds   Hypogonadism in male    Loose tooth due to trauma    front   Vitamin D deficiency     Patient Active Problem List   Diagnosis Date Noted   Mixed hyperlipidemia due to type 2 diabetes mellitus ([place]) 10/29/2019   Hypogonadism in male 10/25/2019   Erectile dysfunction 10/25/2019   Primary osteoarthritis involving multiple joints 07/25/2019   Cervical spondylosis 07/10/2019   CRP elevated 07/10/2019   Insomnia 07/10/2019   Central stenosis of spinal canal 12/12/2018   Foraminal stenosis of cervical region 12/12/2018   Cervical neck pain with evidence of disc disease 12/12/2018   Facet hypertrophy of cervical region 12/12/2018   Loss of memory 12/12/2018   Post concussion syndrome 12/12/2018   Allergic  rhinitis 05/02/2018   Low back pain 12/15/2016   Sciatica of right side 12/15/2016   Gastric polyps    Other diseases of stomach and duodenum    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   Atherosclerosis 08/28/2015   Uncontrolled type 2 diabetes mellitus with hyperglycemia, with long-term current use of insulin ([place]) 08/20/2015   Tobacco abuse 08/20/2015   Essential hypertension, benign 08/20/2015   Abdominal pain, chronic, epigastric 08/17/2015    Past Surgical History:  Procedure Laterality Date   COLONOSCOPY WITH PROPOFOL N/A 09/17/2015   Procedure: COLONOSCOPY WITH PROPOFOL;  Surgeon: [name], MD;  Location: [place] [place];  Service: Endoscopy;  Laterality: N/A;  DIABETIC   ESOPHAGOGASTRODUODENOSCOPY (EGD) WITH PROPOFOL N/A 09/17/2015   Procedure: ESOPHAGOGASTRODUODENOSCOPY (EGD) WITH PROPOFOL;  Surgeon: [name], MD;  Location: [place];  Service: Endoscopy;  Laterality: N/A;   ESOPHAGOGASTRODUODENOSCOPY (EGD) WITH PROPOFOL N/A 10/05/2015   Procedure: ESOPHAGOGASTRODUODENOSCOPY (EGD) WITH PROPOFOL;  Surgeon: [name], MD;  Location: [place];  Service: Endoscopy;  Laterality: N/A;   EUS N/A 10/15/2015   Procedure: UPPER ENDOSCOPIC ULTRASOUND (EUS) LINEAR;  Surgeon: [name], MD;  Location: [facility];  Service: Gastroenterology;  Laterality: N/A;   HERNIA REPAIR  6203   umbilical   VASECTOMY  5597       Home Medications    Prior to Admission medications   Medication Sig Start Date End Date Taking? Authorizing Provider  aspirin EC 81 MG tablet Take 81  mg by mouth daily.   Yes [provider]  atorvastatin (LIPITOR) 40 MG tablet Take 1 tablet (40 mg total) by mouth daily. 09/02/19  Yes Hubbard Hartshorn, FNP  B-D ULTRAFINE III SHORT PEN 31G X 8 MM MISC  12/19/18  Yes [provider]  benazepril (LOTENSIN) 20 MG tablet Take 1 tablet (20 mg total) by mouth daily. 10/29/19  Yes Hubbard Hartshorn, FNP  Continuous Blood  Gluc Sensor (FREESTYLE LIBRE 14 DAY SENSOR) MISC Use 1 kit every 14 (fourteen) days 12/18/18  Yes [provider]  hydrochlorothiazide (HYDRODIURIL) 12.5 MG tablet Take 1 tablet (12.5 mg total) by mouth daily. 10/29/19  Yes Hubbard Hartshorn, FNP  insulin aspart (NOVOLOG FLEXPEN) 100 UNIT/ML FlexPen Inject 12 Units as directed 3 (three) times daily before meals. 01/03/18  Yes [provider]  Insulin Glargine, 2 Unit Dial, 300 UNIT/ML SOPN Inject 50 Units into the skin at bedtime.   Yes [provider]  sildenafil (VIAGRA) 100 MG tablet 0.5-1 tablet 1 hour prior to intercourse 10/25/19  Yes Stoioff, Ronda Fairly, MD  tadalafil (CIALIS) 20 MG tablet Take 1 tablet (20 mg total) by mouth daily as needed for erectile dysfunction. 09/18/19  Yes Stoioff, Ronda Fairly, MD  testosterone cypionate (DEPOTESTOSTERONE CYPIONATE) 200 MG/ML injection Inject 1 mL (200 mg total) into the muscle every 14 (fourteen) days. 11/05/19  Yes Stoioff, Ronda Fairly, MD  clomiPHENE (CLOMID) 50 MG tablet Take 0.5 tablets (25 mg total) by mouth daily. 09/20/19   Stoioff, Ronda Fairly, MD  DULoxetine (CYMBALTA) 20 MG capsule Take 1 tablet once daily at night for 7 days; then increase to 2 tablets once daily at night. 08/29/19   Hubbard Hartshorn, FNP  meloxicam (MOBIC) 15 MG tablet Take 1 tablet (15 mg total) by mouth daily. To replace Ibuprofen 08/29/19   Hubbard Hartshorn, FNP    Family History Family History  Problem Relation Age of Onset   Diabetes Mother    Hypertension Mother    Cancer Father        colon cancer   Diabetes Brother    Hypertension Brother    Diabetes Maternal Grandmother    Hypertension Maternal Grandmother    Heart disease Maternal Grandmother    Liver disease Maternal Grandfather    Cancer Paternal Grandmother    Stroke Paternal Grandfather    Diabetes Brother    Hypertension Brother     Social History Social History   Tobacco Use   Smoking status: Current Every Day Smoker     Packs/day: 1.00    Years: 10.00    Pack years: 10.00    Types: Cigarettes   Smokeless tobacco: Never Used  Substance Use Topics   Alcohol use: Yes    Comment: socially    Drug use: No     Allergies   Atenolol   Review of Systems Review of Systems  Constitutional: Negative for chills and fever.  HENT: Negative for ear pain and sore throat.   Eyes: Negative for pain and visual disturbance.  Respiratory: Negative for cough and shortness of breath.   Cardiovascular: Negative for chest pain and palpitations.  Gastrointestinal: Negative for abdominal pain and vomiting.  Genitourinary: Negative for dysuria and hematuria.  Musculoskeletal: Positive for gait problem and myalgias.  Skin: Negative for color change and rash.  Neurological: Negative for seizures, syncope, weakness and numbness.  All other systems reviewed and are negative.    Physical Exam Triage Vital Signs ED Triage Vitals [  04/14/20 0927]  Enc Vitals Group     BP      Pulse      Resp      Temp      Temp src      SpO2      Weight 175 lb (79.4 kg)     Height _0  (1.727 m)     Head Circumference      Peak Flow      Pain Score 5     Pain Loc      Pain Edu?      Excl. in [place]?    No data found.  Updated Vital Signs BP (!) 141/99 (BP Location: Left Arm)    Pulse (!) 101    Temp 98.5 F (36.9 C) (Oral)    Resp 18    Ht _1  (1.727 m)    Wt 175 lb (79.4 kg)    SpO2 98%    BMI 26.61 kg/m   Visual Acuity Right Eye Distance:   Left Eye Distance:   Bilateral Distance:    Right Eye Near:   Left Eye Near:    Bilateral Near:     Physical Exam Vitals and nursing note reviewed.  Constitutional:      General: He is not in acute distress.    Appearance: He is well-developed. He is not ill-appearing.  HENT:     Head: Normocephalic and atraumatic.     Mouth/Throat:     Mouth: Mucous membranes are moist.  Eyes:     Conjunctiva/sclera: Conjunctivae normal.  Cardiovascular:     Rate and Rhythm: Normal  rate and regular rhythm.     Heart sounds: No murmur.  Pulmonary:     Effort: Pulmonary effort is normal. No respiratory distress.     Breath sounds: Normal breath sounds.  Abdominal:     Palpations: Abdomen is soft.     Tenderness: There is no abdominal tenderness.  Musculoskeletal:        General: Tenderness present. No swelling or deformity. Normal range of motion.     Cervical back: Neck supple.       Legs:     Comments: Right calf tender to palpation and foot flexion.  Skin:    General: Skin is warm and dry.     Capillary Refill: Capillary refill takes less than 2 seconds.     Findings: No bruising, erythema, lesion or rash.  Neurological:     General: No focal deficit present.     Mental Status: He is alert and oriented to person, place, and time.     Sensory: No sensory deficit.     Motor: No weakness.     Gait: Gait abnormal.  Psychiatric:        Mood and Affect: Mood normal.        Behavior: Behavior normal.      UC Treatments / Results  Labs (all labs ordered are listed, but only abnormal results are displayed) Labs Reviewed - No data to display  EKG   Radiology US Venous Img Lower Unilateral Right (DVT)  Result Date: 04/14/2020 CLINICAL DATA:  Right calf pain. EXAM: Right LOWER EXTREMITY VENOUS DOPPLER ULTRASOUND TECHNIQUE: Gray-scale sonography with compression, as well as color and duplex ultrasound, were performed to evaluate the deep venous system(s) from the level of the common femoral vein through the popliteal and proximal calf veins. COMPARISON:  None. FINDINGS: VENOUS Normal compressibility of the common femoral, superficial femoral, and popliteal veins, as  well as the visualized calf veins. Visualized portions of profunda femoral vein and great saphenous vein unremarkable. No filling defects to suggest DVT on grayscale or color Doppler imaging. Doppler waveforms show normal direction of venous flow, normal respiratory plasticity and response to  augmentation. Limited views of the contralateral common femoral vein are unremarkable. OTHER None. Limitations: none IMPRESSION: Negative. Electronically Signed   By: Marijo Conception M.D.   On: 04/14/2020 10:58    Procedures Procedures (including critical care time)  Medications Ordered in UC Medications - No data to display  Initial Impression / Assessment and Plan / UC Course  I have reviewed the triage vital signs and the nursing notes.  Pertinent labs & imaging results that were available during my care of the patient were reviewed by me and considered in my medical decision making (see chart for details).   Right calf pain.  Concern for DVT or Achilles tendonitis.  Ultrasound for DVT negative; discussed results with patient.  Instructed him to follow up with Orthopedics; discussed walk-in clinic available at [facility].  Patient agrees to plan of care.     Final Clinical Impressions(s) / UC Diagnoses   Final diagnoses:  Right calf pain     Discharge Instructions     Go to [facility] for the ultrasound.  I will call you with the results and to discuss next follow up steps.        ED Prescriptions    None     PDMP not reviewed this encounter.   Sharion Balloon, NP 04/14/20 1123    Sharion Balloon, NP 04/14/20 1309    Sharion Balloon, NP 04/14/20 1326

## 2020-04-14 NOTE — Discharge Instructions (Addendum)
Go to [facility] for the ultrasound.  I will call you with the results and to discuss next follow up steps.

## 2020-04-16 ENCOUNTER — Encounter: Payer: Self-pay | Admitting: Family Medicine

## 2020-04-16 ENCOUNTER — Ambulatory Visit (INDEPENDENT_AMBULATORY_CARE_PROVIDER_SITE_OTHER): Payer: BC Managed Care – PPO | Admitting: Family Medicine

## 2020-04-16 ENCOUNTER — Other Ambulatory Visit: Payer: Self-pay

## 2020-04-16 VITALS — BP 122/74 | HR 99 | Temp 97.9°F | Resp 14 | Ht 68.0 in | Wt 173.6 lb

## 2020-04-16 DIAGNOSIS — M797 Fibromyalgia: Secondary | ICD-10-CM | POA: Diagnosis not present

## 2020-04-16 DIAGNOSIS — S86001A Unspecified injury of right Achilles tendon, initial encounter: Secondary | ICD-10-CM

## 2020-04-16 DIAGNOSIS — E782 Mixed hyperlipidemia: Secondary | ICD-10-CM

## 2020-04-16 DIAGNOSIS — Z794 Long term (current) use of insulin: Secondary | ICD-10-CM

## 2020-04-16 DIAGNOSIS — E1169 Type 2 diabetes mellitus with other specified complication: Secondary | ICD-10-CM | POA: Diagnosis not present

## 2020-04-16 DIAGNOSIS — E1165 Type 2 diabetes mellitus with hyperglycemia: Secondary | ICD-10-CM | POA: Diagnosis not present

## 2020-04-16 DIAGNOSIS — I10 Essential (primary) hypertension: Secondary | ICD-10-CM | POA: Diagnosis not present

## 2020-04-16 MED ORDER — BENAZEPRIL-HYDROCHLOROTHIAZIDE 20-12.5 MG PO TABS: 1.0000 | ORAL_TABLET | Freq: Every day | ORAL | 3 refills | Status: DC

## 2020-04-16 MED ORDER — MELOXICAM 15 MG PO TABS: 15.0000 mg | ORAL_TABLET | Freq: Every day | ORAL | 0 refills | Status: DC

## 2020-04-16 MED ORDER — DULOXETINE HCL 20 MG PO CPEP: 20.0000 mg | ORAL_CAPSULE | Freq: Every day | ORAL | 3 refills | Status: DC

## 2020-04-16 NOTE — Progress Notes (Signed)
Name: Henry Fuller   MRN: [id number]    DOB: [birth year]   Date:04/16/2020       Progress Note  Chief Complaint  Patient presents with  . Follow-up  . Hypertension  . Diabetes  . Hyperlipidemia     Subjective:   Henry Fuller is a 44 y.o. male, presents to clinic for routine follow up on the conditions listed above.  DM - sees endocrinology -not very compliant with his FreeStyle meter or with his insulin.  He does better when his meter is on his arm but for some reason he has trouble replacing it -and will usually take about a 2-week break in between meters and during this time is poorly compliant to his insulin injections.  He reports good control when his meter is on his arm.  Seems to have some hesitation due to applying the meter in the same place that it was just applied or due to redness or tenderness.  He does see Dr. Gabriel Carina and had labs in April we reviewed his blood work and A1c at that time, is severely uncontrolled, last A1c was 14.4, however an improvement since January 2020 when his blood sugar was 16.0  HTN today  Hypertension:  Currently managed on benazepril 20 mg and HCTZ 12.5 mg -sometimes he skips his medication, his wife usually manages his pillbox for him so is not sure what pills or doses he is taking for most of his medications right now. Pt reports fair to poor med compliance and denies any SE.  No lightheadedness, hypotension, syncope. Blood pressure today is well controlled. BP Readings from Last 3 Encounters:  04/16/20 122/74  04/14/20 (!) 141/99  10/25/19 (!) 158/98   Pt denies CP, SOB, exertional sx, LE edema, palpitation, Ha's, visual disturbances Dietary efforts for BP?  No diet efforts for diabetes cholesterol or hypertension   HLD & Atherosclerosis:  On atorvastatin 40 mg -last medicine prescribed by his PCP was last October atorvastatin 40 mg, patient was previously on 10 mg and 20 mg and he is not sure what dose he is actually  taking. Last Lipids: Reviewed past med dosing and his last lipid panel with the patient today, was poorly controlled, no dietary efforts, encouraged him to find out at home and with his wife what dose he is currently taking.  He has no complaints or side effects that he knows of over the past 6 to 9 months, however it is unclear if his dose increased and or if he tolerated this Lab Results  Component Value Date   CHOL 196 08/29/2019   HDL 36 (L) 08/29/2019   LDLCALC 134 (H) 08/29/2019   TRIG 132 08/29/2019   CHOLHDL 5.4 (H) 08/29/2019   He denies chest pain, shortness of breath. He has history of fibromyalgia no change to his chronic pain and fatigue  Patient reports he has a history of elevated CRP and fibromyalgia.  He states that he did go to a rheumatologist but they said there is nothing they could do for fibromyalgia.  He was previously on meloxicam and duloxetine.  He is a poor historian but slightly difficult to understand his med changes in the past but it sounds like he at some point had been on Cymbalta and he was "getting used to it" so they were attempting to increase it he states that he cannot tolerate 60 mg makes him extremely tired for several days.  His last dose in the chart is 20 mg last  October with instructions to start 20 mg in the evening for 7 days then increase to 40 mg in the evening.  He will take Cymbalta intermittently and has not been taking 20 mg or 40 mg daily for some time.  He was told to stop the Mobic because they told him he should not be on meloxicam and Cymbalta at the same time (?). He also reports taking Cymbalta intermittently because it says to not drive or operate heavy machinery so he has been avoiding taking in the evening so that he can work and drive the next day.  Right heel and Achilles tendon pain onset 3 days ago when he was just walking across his living room felt a sharp pain became very severe very suddenly has been very constant since he went  to the ER for evaluation and they ruled out a DVT and encouraged him to follow-up with Ortho urgent care he states he was tired and did not want to go so he has been trying to rest he has not gone to work and his pain is worse with movement palpation or with dorsiflexion  Patient Active Problem List   Diagnosis Date Noted  . Mixed hyperlipidemia due to type 2 diabetes mellitus ([place]) 10/29/2019  . Hypogonadism in male 10/25/2019  . Erectile dysfunction 10/25/2019  . Primary osteoarthritis involving multiple joints 07/25/2019  . Cervical spondylosis 07/10/2019  . CRP elevated 07/10/2019  . Insomnia 07/10/2019  . Central stenosis of spinal canal 12/12/2018  . Foraminal stenosis of cervical region 12/12/2018  . Cervical neck pain with evidence of disc disease 12/12/2018  . Facet hypertrophy of cervical region 12/12/2018  . Loss of memory 12/12/2018  . Post concussion syndrome 12/12/2018  . Allergic rhinitis 05/02/2018  . Low back pain 12/15/2016  . Sciatica of right side 12/15/2016  . Gastric polyps   . Other diseases of stomach and duodenum   . 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 Atherosclerosis 08/28/2015  . Uncontrolled type 2 diabetes mellitus with hyperglycemia, with long-term current use of insulin ([place]) 08/20/2015  . Tobacco abuse 08/20/2015  . Essential hypertension, benign 08/20/2015  . Abdominal pain, chronic, epigastric 08/17/2015    Past Surgical History:  Procedure Laterality Date  . COLONOSCOPY WITH PROPOFOL N/A 09/17/2015   Procedure: COLONOSCOPY WITH PROPOFOL;  Surgeon: [name], MD;  Location: [facility];  Service: Endoscopy;  Laterality: N/A;  DIABETIC  . ESOPHAGOGASTRODUODENOSCOPY (EGD) WITH PROPOFOL N/A 09/17/2015   Procedure: ESOPHAGOGASTRODUODENOSCOPY (EGD) WITH PROPOFOL;  Surgeon: [name], MD;  Location: [facility];  Service: Endoscopy;  Laterality: N/A;  . ESOPHAGOGASTRODUODENOSCOPY (EGD) WITH PROPOFOL N/A 10/05/2015   Procedure:  ESOPHAGOGASTRODUODENOSCOPY (EGD) WITH PROPOFOL;  Surgeon: [name], MD;  Location: [facility];  Service: Endoscopy;  Laterality: N/A;  . EUS N/A 10/15/2015   Procedure: UPPER ENDOSCOPIC ULTRASOUND (EUS) LINEAR;  Surgeon: [name], MD;  Location: [facility];  Service: Gastroenterology;  Laterality: N/A;  . HERNIA REPAIR  [id number]   umbilical  . VASECTOMY  [id number]    Family History  Problem Relation Age of Onset  . Diabetes Mother   . Hypertension Mother   . Cancer Father        colon cancer  . Diabetes Brother   . Hypertension Brother   . Diabetes Maternal Grandmother   . Hypertension Maternal Grandmother   . Heart disease Maternal Grandmother   . Liver disease Maternal Grandfather   . Cancer Paternal Grandmother   . Stroke Paternal Grandfather   .  Diabetes Brother   . Hypertension Brother     Social History   Tobacco Use  . Smoking status: Current Every Day Smoker    Packs/day: 1.00    Years: 10.00    Pack years: 10.00    Types: Cigarettes  . Smokeless tobacco: Never Used  Substance Use Topics  . Alcohol use: Yes    Comment: socially   . Drug use: No      Current Outpatient Medications:  .  aspirin EC 81 MG tablet, Take 81 mg by mouth daily., Disp: , Rfl:  .  atorvastatin (LIPITOR) 40 MG tablet, Take 1 tablet (40 mg total) by mouth daily., Disp: 90 tablet, Rfl: 3 .  B-D ULTRAFINE III SHORT PEN 31G X 8 MM MISC, , Disp: , Rfl:  .  benazepril (LOTENSIN) 20 MG tablet, Take 1 tablet (20 mg total) by mouth daily., Disp: 90 tablet, Rfl: 0 .  DULoxetine (CYMBALTA) 20 MG capsule, Take 1 tablet once daily at night for 7 days; then increase to 2 tablets once daily at night., Disp: 180 capsule, Rfl: 1 .  hydrochlorothiazide (HYDRODIURIL) 12.5 MG tablet, Take 1 tablet (12.5 mg total) by mouth daily., Disp: 90 tablet, Rfl: 0 .  insulin aspart (NOVOLOG FLEXPEN) 100 UNIT/ML FlexPen, Inject 12 Units as directed 3 (three) times daily before meals., Disp: , Rfl:  .   Insulin Glargine, 2 Unit Dial, 300 UNIT/ML SOPN, Inject 50 Units into the skin at bedtime., Disp: , Rfl:  .  testosterone cypionate (DEPOTESTOSTERONE CYPIONATE) 200 MG/ML injection, Inject 1 mL (200 mg total) into the muscle every 14 (fourteen) days., Disp: 4 mL, Rfl: 0 .  Continuous Blood Gluc Sensor (FREESTYLE LIBRE 14 DAY SENSOR) MISC, Use 1 kit every 14 (fourteen) days, Disp: , Rfl:  .  meloxicam (MOBIC) 15 MG tablet, Take 1 tablet (15 mg total) by mouth daily. To replace Ibuprofen (Patient not taking: Reported on 04/16/2020), Disp: 90 tablet, Rfl: 0  Allergies  Allergen Reactions  . Atenolol Other (See Comments)    Chest pressure    Chart Review Today: I personally reviewed active problem list, medication list, allergies, family history, social history, health maintenance, notes from last encounter, lab results, imaging with the patient/caregiver today.   Review of Systems  10 Systems reviewed and are negative for acute change except as noted in the HPI.  Objective:    Vitals:   04/16/20 1526  BP: 122/74  Pulse: 99  Resp: 14  Temp: 97.9 F (36.6 C)  SpO2: 95%  Weight: 173 lb 9.6 oz (78.7 kg)  Height: '5\' 8"'  (1.727 m)    Body mass index is 26.4 kg/m.  Physical Exam Vitals and nursing note reviewed.  Constitutional:      General: He is not in acute distress.    Appearance: Normal appearance. He is well-developed and normal weight. He is diaphoretic. He is not ill-appearing or toxic-appearing.     Interventions: Face mask in place.  HENT:     Head: Normocephalic and atraumatic.     Jaw: No trismus.     Right Ear: External ear normal.     Left Ear: External ear normal.  Eyes:     General: Lids are normal. No scleral icterus.    Conjunctiva/sclera: Conjunctivae normal.  Neck:     Trachea: Trachea and phonation normal. No tracheal deviation.  Cardiovascular:     Rate and Rhythm: Normal rate and regular rhythm.     Pulses: Normal pulses.  Radial pulses are 2+  on the right side and 2+ on the left side.       Posterior tibial pulses are 2+ on the right side and 2+ on the left side.     Heart sounds: Normal heart sounds. No murmur. No friction rub. No gallop.   Pulmonary:     Effort: Pulmonary effort is normal. No respiratory distress.     Breath sounds: Normal breath sounds. No stridor. No wheezing, rhonchi or rales.  Abdominal:     General: Bowel sounds are normal. There is no distension.     Palpations: Abdomen is soft.  Musculoskeletal:     Right lower leg: No edema.     Left lower leg: No edema.     Right ankle:     Right Achilles Tendon: Tenderness present.     Comments: Right Achilles tendon and lower leg and calf diffusely edematous and tender to palpation Do not feel any Achilles tendon defect, patient did not tolerate Thompson test mildly antalgic gait Right Achilles tendon feels enlarged and firm compared to the left Achilles tendon  Skin:    Capillary Refill: Capillary refill takes less than 2 seconds.     Coloration: Skin is not jaundiced or pale.     Findings: No rash.     Nails: There is no clubbing.  Neurological:     Mental Status: He is alert.     Cranial Nerves: No dysarthria or facial asymmetry.     Motor: No tremor or abnormal muscle tone.     Gait: Gait normal.  Psychiatric:        Mood and Affect: Mood normal.        Speech: Speech normal.        Behavior: Behavior normal. Behavior is cooperative.       PHQ2/9: Depression screen [facility] 2/9 04/16/2020 10/29/2019 08/29/2019 07/18/2019 06/14/2019  Decreased Interest 0 0 3 0 2  Down, Depressed, Hopeless 0 0 1 0 3  PHQ - 2 Score 0 0 4 0 5  Altered sleeping 0 0 3 0 3  Tired, decreased energy 0 0 3 0 3  Change in appetite 0 0 1 0 0  Feeling bad or failure about yourself  0 0 1 0 2  Trouble concentrating 0 0 1 0 0  Moving slowly or fidgety/restless 0 0 1 0 0  Suicidal thoughts 0 0 0 0 0  PHQ-9 Score 0 0 14 0 13  Difficult doing work/chores Not difficult at all Not  difficult at all Not difficult at all - Somewhat difficult  Some recent data might be hidden    phq 9 is neg, reviewed today  Fall Risk: Fall Risk  04/16/2020 10/29/2019 08/29/2019 07/18/2019 06/14/2019  Falls in the past year? 1 0 0 0 0  Number falls in past yr: 1 0 0 0 0  Injury with Fall? 1 0 0 0 0  Follow up - Falls evaluation completed Falls evaluation completed - -    Functional Status Survey: Is the patient deaf or have difficulty hearing?: No Does the patient have difficulty seeing, even when wearing glasses/contacts?: No Does the patient have difficulty concentrating, remembering, or making decisions?: No Does the patient have difficulty walking or climbing stairs?: No Does the patient have difficulty dressing or bathing?: No Does the patient have difficulty doing errands alone such as visiting a doctor's office or shopping?: No   Assessment & Plan:   1. Uncontrolled type 2 diabetes mellitus with  hyperglycemia, with long-term current use of insulin (HCC) Last A1C 14.4 -he is seeing endocrinology, has poor compliance with his FreeStyle meter and with insulin dosing, encouraged him to take shorter breaks between applying his meters does seem to be some anxiety or fear related to replacing the meter because of some mild redness or tenderness to the skin after removing the meter.  Explained that giving himself a 2 to 3-day break from the meter instead of 2 weeks will likely lead to better A1c outcomes since he reports having much better blood sugar control when FreeStyle meter is on giving him data.   2. Essential hypertension, benign Blood pressures well controlled today, appears stable and at goal Patient has fairly poor compliance with his medications, on benazepril and hydrochlorothiazide they were previously a combo pill but currently are 2 separate medications, I discussed this with him and will try to get the combo pill again hope this will help with med compliance Patient  encouraged to contact me if is not available Dose to remain the same 20 mg benazepril and 12.5 mg hydrochlorothiazide - COMPLETE METABOLIC PANEL WITH GFR  3. Mixed hyperlipidemia due to type 2 diabetes mellitus (HCC) Good compliance to statin although he does not know what dose he is on Last lipid panel was suboptimal His PCP last year was trying to increase atorvastatin dose to 40 mg, patient was encouraged to check at home with his wife to see what dose he is actually taking, explained to him that we will call with lab results and if cholesterol is still poorly controlled we will need to adjust medicine dose depending on what is currently taking - Lipid panel - COMPLETE METABOLIC PANEL WITH GFR  4. Fibromyalgia Encouraged him to start duloxetine lower dose daily in the evening see if it helps with his chronic pain and fatigue Explained that it is used for chronic pain and should be taken daily not intermittently patient seems very fatigued when he takes high-dose 60 mg so he was encouraged to avoid taking high doses. I reviewed interaction between Cymbalta and Mobic he has not had any bleeding problems but we will lower dose of Cymbalta encouraged him to watch for any signs of bleeding there is a slight possibility of med interaction causing increased concentrations of Cymbalta. Encouraged him to manage pain also with Tylenol as needed and Mobic as needed and avoid other NSAIDs Other options like lyrica? Muscle relaxers? - DULoxetine (CYMBALTA) 20 MG capsule; Take 1 capsule (20 mg total) by mouth at bedtime.  Dispense: 90 capsule; Refill: 3 - meloxicam (MOBIC) 15 MG tablet; Take 1 tablet (15 mg total) by mouth daily. To replace Ibuprofen  Dispense: 90 tablet; Refill: 0  5. Unspecified injury of right Achilles tendon, initial encounter Patient had acute onset right posterior ankle/heel/Achilles tendon area tenderness and pain that has been constant since its onset.  The area is diffusely  edematous very tender. I did not palpate a tendon defect but he may have a partial tear?  He did get seen in the ER with a DVT rule out and was encouraged to follow-up with Ortho urgent care but he has been too tired to go, put in an orthopedic referral encouraged him to go for an evaluation they may need to put him in a boot with plantarflexion to allow him to heal encouraged him to ice rest and if he returns to work or has to do a lot of walking to do a bulky Ace wrap or  brace over-the-counter to limit his dorsiflexion.   Unfortunately he was very tender today and I was unable to do a full exam on the ankle due to his pain - Ambulatory referral to Orthopedic Surgery    Return for 3-4 month f/up .   Delsa Grana, PA-C 04/16/20 3:51 PM

## 2020-04-16 NOTE — Patient Instructions (Addendum)
Check you cholesterol dose   WE will call you with your lab results  Your BP med should be in a combo pill - stop the individual pills if its available at [facility] taking lower dose cymbalta (duloxitine) 20 mg daily at bedtime - and see if it helps with pain and does not cause drowsiness.  If you are not sleepy with the meds, then you are okay to drive and go to work.  You can keep taking tylenol [phone] mg up to 3x a day for pain You can take mobic once daily for pain as needed - do not take with any other NSAIDs like ibuprofen or aleve

## 2020-04-17 LAB — LIPID PANEL
Cholesterol: 122 mg/dL (ref <200)
HDL: 24 mg/dL — ABNORMAL LOW (ref >=40)
LDL Cholesterol (Calc): 64 mg/dL (calc)
Non-HDL Cholesterol (Calc): 98 mg/dL (calc) (ref <130)
Total CHOL/HDL Ratio: 5.1 (calc) — ABNORMAL HIGH (ref <5.0)
Triglycerides: 286 mg/dL — ABNORMAL HIGH (ref <150)

## 2020-04-17 LAB — COMPLETE METABOLIC PANEL WITH GFR
AG Ratio: 1.4 (calc) (ref 1.0–2.5)
ALT: 38 U/L (ref 9–46)
AST: 19 U/L (ref 10–40)
Albumin: 4.1 g/dL (ref 3.6–5.1)
Alkaline phosphatase (APISO): 114 U/L (ref 36–130)
BUN: 14 mg/dL (ref 7–25)
CO2: 26 mmol/L (ref 20–32)
Calcium: 10 mg/dL (ref 8.6–10.3)
Chloride: 94 mmol/L — ABNORMAL LOW (ref 98–110)
Creat: 1.18 mg/dL (ref 0.60–1.35)
GFR, Est African American: 86 mL/min/{1.73_m2} (ref >=60)
GFR, Est Non African American: 74 mL/min/{1.73_m2} (ref >=60)
Globulin: 3 g/dL (calc) (ref 1.9–3.7)
Glucose, Bld: 485 mg/dL — ABNORMAL HIGH (ref 65–99)
Potassium: 4.2 mmol/L (ref 3.5–5.3)
Sodium: 132 mmol/L — ABNORMAL LOW (ref 135–146)
Total Bilirubin: 0.9 mg/dL (ref 0.2–1.2)
Total Protein: 7.1 g/dL (ref 6.1–8.1)

## 2020-04-21 ENCOUNTER — Ambulatory Visit: Payer: BC Managed Care – PPO

## 2020-05-05 ENCOUNTER — Other Ambulatory Visit: Payer: Self-pay

## 2020-05-05 ENCOUNTER — Ambulatory Visit (INDEPENDENT_AMBULATORY_CARE_PROVIDER_SITE_OTHER): Payer: BC Managed Care – PPO | Admitting: Family Medicine

## 2020-05-05 ENCOUNTER — Encounter: Payer: Self-pay | Admitting: Family Medicine

## 2020-05-05 VITALS — BP 138/84 | HR 98 | Temp 97.9°F | Resp 18 | Ht 68.0 in | Wt 180.3 lb

## 2020-05-05 DIAGNOSIS — M79671 Pain in right foot: Secondary | ICD-10-CM

## 2020-05-05 DIAGNOSIS — Z794 Long term (current) use of insulin: Secondary | ICD-10-CM

## 2020-05-05 DIAGNOSIS — L539 Erythematous condition, unspecified: Secondary | ICD-10-CM | POA: Diagnosis not present

## 2020-05-05 DIAGNOSIS — E1165 Type 2 diabetes mellitus with hyperglycemia: Secondary | ICD-10-CM | POA: Diagnosis not present

## 2020-05-05 MED ORDER — PREGABALIN 25 MG PO CAPS
25.0000 mg | ORAL_CAPSULE | Freq: Two times a day (BID) | ORAL | 0 refills | Status: DC
Start: 2020-05-05 — End: 2020-08-19

## 2020-05-05 NOTE — Patient Instructions (Signed)
Take full aspirin a day until you see vascular specialist  Go to the ER if your foot turns pale or pulseless  Peripheral Vascular Disease  Peripheral vascular disease (PVD) is a disease of the blood vessels that are not part of your heart and brain. A simple term for PVD is poor circulation. In most cases, PVD narrows the blood vessels that carry blood from your heart to the rest of your body. This can reduce the supply of blood to your arms, legs, and internal organs, like your stomach or kidneys. However, PVD most often affects a person's lower legs and feet. Without treatment, PVD tends to get worse. PVD can also lead to acute ischemic limb. This is when an arm or leg suddenly cannot get enough blood. This is a medical emergency. Follow these instructions at home: Lifestyle  Do not use any products that contain nicotine or tobacco, such as cigarettes and e-cigarettes. If you need help quitting, ask your doctor.  Lose weight if you are overweight. Or, stay at a healthy weight as told by your doctor.  Eat a diet that is low in fat and cholesterol. If you need help, ask your doctor.  Exercise regularly. Ask your doctor for activities that are right for you. General instructions  Take over-the-counter and prescription medicines only as told by your doctor.  Take good care of your feet: ? Wear comfortable shoes that fit well. ? Check your feet often for any cuts or sores.  Keep all follow-up visits as told by your doctor This is important. Contact a doctor if:  You have cramps in your legs when you walk.  You have leg pain when you are at rest.  You have coldness in a leg or foot.  Your skin changes.  You are unable to get or have an erection (erectile dysfunction).  You have cuts or sores on your feet that do not heal. Get help right away if:  Your arm or leg turns cold, numb, and blue.  Your arms or legs become red, warm, swollen, painful, or numb.  You have chest  pain.  You have trouble breathing.  You suddenly have weakness in your face, arm, or leg.  You become very confused or you cannot speak.  You suddenly have a very bad headache.  You suddenly cannot see. Summary  Peripheral vascular disease (PVD) is a disease of the blood vessels.  A simple term for PVD is poor circulation. Without treatment, PVD tends to get worse.  Treatment may include exercise, low fat and low cholesterol diet, and quitting smoking. This information is not intended to replace advice given to you by your health care provider. Make sure you discuss any questions you have with your health care provider. Document Revised: 10/13/2017 Document Reviewed: 12/08/2016 Elsevier Patient Education  2020 [employer].

## 2020-05-05 NOTE — Progress Notes (Signed)
Patient ID: Henry Fuller, male    DOB: 04/29/1976, 44 y.o.   MRN: [id number]  PCP: Delsa Grana, PA-C  Chief Complaint  Patient presents with  . Foot Pain    toes numb, turning color rednish, blue, purple, painful, hard to walk. Right worse then left for 2 weeks    Subjective:   Henry Fuller is a 44 y.o. male, presents to clinic with CC of the following:  HPI  Patient presents for bilateral foot and toe pain, right toes are more painful than the left. He reports current 5 out of 10 pain, onset was gradual over the past 2 weeks or so. He is previously in office for routine follow-up and did complain of right Achilles and right heel pain radiating up to his calf but at that time he did not mention foot or toe pain. He is also concerned with change the skin color, he feels that he has a blood flow problem or a blood clot. He reports some numbness in his toes his right great toe and third and fourth toe are getting red to purple in color. His pain is worse at work when walking but also hurts and tingles and burns at night. He has been given gabapentin in the past is not currently on any gabapentin.   He denies any pallor or ashy appearance, he has no wounds and denies any recent injuries to his feet or toes  He has a noncompliant uncontrolled insulin-dependent diabetic  About 2 to 3 weeks ago with his right heel and calf pain he did present to the ER was evaluated and had DVT ruled out, was subsequently referred to Ortho but he did not go he later presented to the office for his routine office visit, at the last office visit his Achilles tendon area and posterior heel and calf was slightly swollen and very tender to the touch, that has improved. He was referred to outpatient Ortho and they did call him but because he was feeling a bit better he did not go to follow-up with the orthopedist.    Patient Active Problem List   Diagnosis Date Noted  . Mixed hyperlipidemia due to  type 2 diabetes mellitus ([place]) 10/29/2019  . Hypogonadism in male 10/25/2019  . Erectile dysfunction 10/25/2019  . Primary osteoarthritis involving multiple joints 07/25/2019  . Cervical spondylosis 07/10/2019  . CRP elevated 07/10/2019  . Insomnia 07/10/2019  . Central stenosis of spinal canal 12/12/2018  . Foraminal stenosis of cervical region 12/12/2018  . Cervical neck pain with evidence of disc disease 12/12/2018  . Facet hypertrophy of cervical region 12/12/2018  . Loss of memory 12/12/2018  . Post concussion syndrome 12/12/2018  . Allergic rhinitis 05/02/2018  . Low back pain 12/15/2016  . Sciatica of right side 12/15/2016  . Gastric polyps   . Other diseases of stomach and duodenum   . 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 Atherosclerosis 08/28/2015  . Uncontrolled type 2 diabetes mellitus with hyperglycemia, with long-term current use of insulin ([place]) 08/20/2015  . Tobacco abuse 08/20/2015  . Essential hypertension, benign 08/20/2015  . Abdominal pain, chronic, epigastric 08/17/2015      Current Outpatient Medications:  .  aspirin EC 81 MG tablet, Take 81 mg by mouth daily., Disp: , Rfl:  .  atorvastatin (LIPITOR) 40 MG tablet, Take 1 tablet (40 mg total) by mouth daily., Disp: 90 tablet, Rfl: 3 .  benazepril-hydrochlorthiazide (LOTENSIN HCT) 20-12.5 MG tablet, Take 1 tablet by mouth  daily., Disp: 90 tablet, Rfl: 3 .  DULoxetine (CYMBALTA) 20 MG capsule, Take 1 capsule (20 mg total) by mouth at bedtime., Disp: 90 capsule, Rfl: 3 .  insulin aspart (NOVOLOG FLEXPEN) 100 UNIT/ML FlexPen, Inject 12 Units as directed 3 (three) times daily before meals., Disp: , Rfl:  .  Insulin Glargine, 2 Unit Dial, 300 UNIT/ML SOPN, Inject 50 Units into the skin at bedtime., Disp: , Rfl:  .  meloxicam (MOBIC) 15 MG tablet, Take 1 tablet (15 mg total) by mouth daily. To replace Ibuprofen, Disp: 90 tablet, Rfl: 0 .  testosterone cypionate (DEPOTESTOSTERONE CYPIONATE) 200 MG/ML injection, Inject 1 mL  (200 mg total) into the muscle every 14 (fourteen) days., Disp: 4 mL, Rfl: 0 .  B-D ULTRAFINE III SHORT PEN 31G X 8 MM MISC, , Disp: , Rfl:  .  Continuous Blood Gluc Sensor (FREESTYLE LIBRE 14 DAY SENSOR) MISC, Use 1 kit every 14 (fourteen) days (Patient not taking: Reported on 05/05/2020), Disp: , Rfl:    Allergies  Allergen Reactions  . Atenolol Other (See Comments)    Chest pressure     Social History   Tobacco Use  . Smoking status: Current Every Day Smoker    Packs/day: 1.00    Years: 10.00    Pack years: 10.00    Types: Cigarettes  . Smokeless tobacco: Never Used  Vaping Use  . Vaping Use: Never used  Substance Use Topics  . Alcohol use: Yes    Comment: socially   . Drug use: No      Chart Review Today: I personally reviewed active problem list, medication list, allergies, family history, social history, health maintenance, notes from last encounter, lab results, imaging with the patient/caregiver today.   Review of Systems 10 Systems reviewed and are negative for acute change except as noted in the HPI.    Objective:   Vitals:   05/05/20 1325  BP: 138/84  Pulse: 98  Resp: 18  Temp: 97.9 F (36.6 C)  TempSrc: Temporal  SpO2: 99%  Weight: 180 lb 4.8 oz (81.8 kg)  Height: '5\' 8"'  (1.727 m)    Body mass index is 27.41 kg/m.  Physical Exam Vitals and nursing note reviewed.  Constitutional:      General: He is not in acute distress.    Appearance: Normal appearance. He is not ill-appearing, toxic-appearing or diaphoretic.     Comments: Well, but uncomfortable appearing male, non-toxic, afebrile  HENT:     Right Ear: External ear normal.     Left Ear: External ear normal.  Cardiovascular:     Pulses:          Posterior tibial pulses are detected w/ Doppler on the right side and detected w/ Doppler on the left side.     Comments: Posterior tibialis pulses not detected with palpation but with bedside Doppler Dorsal pedis pulses not detected with palpation  or with Doppler bilaterally Musculoskeletal:     Right ankle:     Right Achilles Tendon: Normal. No tenderness or defects. Thompson's test negative.     Left ankle:     Left Achilles Tendon: Normal. No tenderness or defects. Thompson's test negative.     Right foot: Tenderness present.     Left foot: Tenderness present.  Feet:     Right foot:     Skin integrity: No fissure.     Left foot:     Skin integrity: No fissure.     Comments: Right foot and great  toe, 3rd and 4th digits - dependent rubor and pallor on elevation, diffusely ttp to palpation  Grossly normal sensation  Skin:    General: Skin is warm.     Capillary Refill: Capillary refill takes 2 to 3 seconds.     Nails: There is no clubbing.  Neurological:     Mental Status: He is alert.     Gait: Gait abnormal (antalgic gait).  Psychiatric:        Mood and Affect: Mood normal.        Behavior: Behavior normal.      Results for orders placed or performed in visit on 04/16/20  Hemoglobin A1c  Result Value Ref Range   Hemoglobin A1C 14.4        Assessment & Plan:     ICD-10-CM   1. Right foot pain  M79.671 Ambulatory referral to Vascular Surgery   right foot with dependent rubor and pallor on elevation to great toe, 3rd and 4th toes - very ttp, with numbness and pain  2. Dependent rubor  L53.9 Ambulatory referral to Vascular Surgery   concern for PAD - urgent referral reached out to referral team- will need ASAP - discussed ER precautions- pallor pulselessness wounds, pt verbalized understand  3. Uncontrolled type 2 diabetes mellitus with hyperglycemia, with long-term current use of insulin (HCC)  E11.65    Z79.4    discussed importance of glycemic control for his health and his perfusion- not compliant with endo, wife here, she is concerned about "high insulin" dosing    Patient was encouraged to stop his Mobic for his prior muscle skeletal injury and take a full aspirin until he sees vascular specialist. If he is  unable to get into vascular specialist he may need to go to the ER to get a more urgent evaluation for his perfusion.  Spent a significant amount of time today discussing the importance of glycemic control with his diabetes explained how that poorly controlled diabetes decreases his blood flow in his immune system puts him at risk for amputation, infection, endorgan damage etc. Explained to the patient and to his wife that he needs to titrate his insulin per endocrinology recommendations so that his blood sugar can be controlled.  He is very concerned about blood clots today. He was recently evaluated in the ER and DVT was ruled out. Unclear how his calf pain and Achilles tendon swelling and pain with a recent popping mechanism of injury -unclear how that would be related to his current symptoms?  Some his discoloration may be bruising that has gradually worsened in dependent areas? He does note that he had diabetic peripheral neuropathy prior to his last office visit and in the past 2 weeks it gradually worsened bilaterally with right foot and toe pain greater than the left. He does not endorse any worsening with ambulation just that it hurts to walk on his feet at all hurts in his work boots but also hurts at night - so not sure if he has claudication?    I had a difficult time assessing his pulses, we do have a older bedside doppler that I was able to find the posterior tibialis pulses easily with, but could not hear or find DP pulses bilaterally.  He was warm to the touch, did have 2-3s cap refill to right foot all toes, no cyanosis, he was diffusely tender to legs/feet/toes B/L, endorsed pain and paresthesias, he had good ROM but pain with movement, palpation and ambulation.  Plan for URGENT vascular eval - ER if not able to get into specialist in the next 1-2 days  Continue cymbalta for pain, add lyrica, can titrate dose from 25 BID to 75 BID F/up vascular  Pt need f/up endo for non-compliant  uncontrolled IDDM - sugars averaging 300+ per pt report, labs in office 04/16/2020 blood glucose 485, last A1C 14.4     Delsa Grana, PA-C 05/05/20 1:49 PM

## 2020-05-07 ENCOUNTER — Other Ambulatory Visit: Payer: Self-pay | Admitting: *Deleted

## 2020-05-07 ENCOUNTER — Encounter: Payer: Self-pay | Admitting: Urology

## 2020-05-07 MED ORDER — CLOMIPHENE CITRATE 50 MG PO TABS: 25.0000 mg | ORAL_TABLET | Freq: Every day | ORAL | 10 refills | Status: DC

## 2020-05-15 ENCOUNTER — Other Ambulatory Visit: Payer: Self-pay

## 2020-05-15 ENCOUNTER — Encounter: Payer: Self-pay | Admitting: Urology

## 2020-05-15 MED ORDER — SILDENAFIL CITRATE 100 MG PO TABS
100.0000 mg | ORAL_TABLET | Freq: Every day | ORAL | 3 refills | Status: DC | PRN
Start: 2020-05-15 — End: 2021-08-02

## 2020-06-01 ENCOUNTER — Encounter: Payer: Self-pay | Admitting: Family Medicine

## 2020-06-01 ENCOUNTER — Encounter (INDEPENDENT_AMBULATORY_CARE_PROVIDER_SITE_OTHER): Payer: Self-pay | Admitting: Vascular Surgery

## 2020-06-25 ENCOUNTER — Encounter (INDEPENDENT_AMBULATORY_CARE_PROVIDER_SITE_OTHER): Payer: Self-pay | Admitting: Vascular Surgery

## 2020-06-25 ENCOUNTER — Other Ambulatory Visit: Payer: Self-pay

## 2020-06-25 ENCOUNTER — Ambulatory Visit (INDEPENDENT_AMBULATORY_CARE_PROVIDER_SITE_OTHER): Payer: BC Managed Care – PPO | Admitting: Vascular Surgery

## 2020-06-25 VITALS — BP 171/109 | HR 96 | Ht 68.0 in | Wt 175.0 lb

## 2020-06-25 DIAGNOSIS — M79604 Pain in right leg: Secondary | ICD-10-CM | POA: Diagnosis not present

## 2020-06-25 DIAGNOSIS — I739 Peripheral vascular disease, unspecified: Secondary | ICD-10-CM | POA: Diagnosis not present

## 2020-06-25 DIAGNOSIS — Z794 Long term (current) use of insulin: Secondary | ICD-10-CM

## 2020-06-25 DIAGNOSIS — E1165 Type 2 diabetes mellitus with hyperglycemia: Secondary | ICD-10-CM

## 2020-06-25 DIAGNOSIS — M159 Polyosteoarthritis, unspecified: Secondary | ICD-10-CM

## 2020-06-25 DIAGNOSIS — M8949 Other hypertrophic osteoarthropathy, multiple sites: Secondary | ICD-10-CM | POA: Diagnosis not present

## 2020-06-25 DIAGNOSIS — I10 Essential (primary) hypertension: Secondary | ICD-10-CM

## 2020-07-01 ENCOUNTER — Telehealth (INDEPENDENT_AMBULATORY_CARE_PROVIDER_SITE_OTHER): Payer: Self-pay | Admitting: Vascular Surgery

## 2020-07-01 NOTE — Telephone Encounter (Signed)
Patient called to check the status of his Ct request. But there is no order completed for this patient to have a CT

## 2020-07-02 ENCOUNTER — Encounter (INDEPENDENT_AMBULATORY_CARE_PROVIDER_SITE_OTHER): Payer: Self-pay | Admitting: Vascular Surgery

## 2020-07-02 DIAGNOSIS — I739 Peripheral vascular disease, unspecified: Secondary | ICD-10-CM | POA: Insufficient documentation

## 2020-07-02 DIAGNOSIS — M79606 Pain in leg, unspecified: Secondary | ICD-10-CM | POA: Insufficient documentation

## 2020-07-02 NOTE — Telephone Encounter (Signed)
In working process

## 2020-07-02 NOTE — Progress Notes (Signed)
MRN : [id number]  Henry Fuller is a 44 y.o. (01/13/1976) male who presents with chief complaint of  Chief Complaint  Patient presents with  . New Patient (Initial Visit)    RLE pain  .  History of Present Illness:   The patient is seen for evaluation of painful lower extremities. Patient notes the pain is variable and not always associated with activity.  The pain is somewhat consistent day to day occurring on most days. The patient notes the pain also occurs with standing and routinely seems worse as the day wears on. The pain has been progressive over the past several years. He notes the pain ais associated with weakness and seems to be mostly in the right foot.  He also notes a couple weeks ago his toes turned purple but this seems to have gotton better.  The patient denies rest pain or dangling of an extremity off the side of the bed during the night for relief. No open wounds or sores at this time. No history of DVT or phlebitis. No prior interventions or surgeries.  There is a  history of back and neck problems and DJD of the lumbar and sacral spine.    Current Meds  Medication Sig  . aspirin EC 81 MG tablet Take 81 mg by mouth daily.  Marland Kitchen atorvastatin (LIPITOR) 40 MG tablet Take 1 tablet (40 mg total) by mouth daily.  . B-D ULTRAFINE III SHORT PEN 31G X 8 MM MISC   . benazepril-hydrochlorthiazide (LOTENSIN HCT) 20-12.5 MG tablet Take 1 tablet by mouth daily.  . Continuous Blood Gluc Sensor (FREESTYLE LIBRE 14 DAY SENSOR) MISC Use 1 kit every 14 (fourteen) days  . DULoxetine (CYMBALTA) 20 MG capsule Take 1 capsule (20 mg total) by mouth at bedtime.  . insulin aspart (NOVOLOG FLEXPEN) 100 UNIT/ML FlexPen Inject 12 Units as directed 3 (three) times daily before meals.  . Insulin Glargine, 2 Unit Dial, 300 UNIT/ML SOPN Inject 50 Units into the skin at bedtime.  Marland Kitchen testosterone cypionate (DEPOTESTOSTERONE CYPIONATE) 200 MG/ML injection Inject 1 mL (200 mg total) into the  muscle every 14 (fourteen) days.    Past Medical History:  Diagnosis Date  . Concussion   . Diabetes mellitus without complication ([place])    type 2  . Diabetic retinopathy associated with controlled type 2 diabetes mellitus ([place]) 05/25/2018   Dr. Ellin Mayhew, Sept 2018  . Erectile dysfunction   . GERD (gastroesophageal reflux disease)   . Hyperlipidemia   . Hypertension    controlled on meds  . Hypogonadism in male   . Loose tooth due to trauma    front  . Vitamin D deficiency     Past Surgical History:  Procedure Laterality Date  . COLONOSCOPY WITH PROPOFOL N/A 09/17/2015   Procedure: COLONOSCOPY WITH PROPOFOL;  Surgeon: [name], MD;  Location: [facility];  Service: Endoscopy;  Laterality: N/A;  DIABETIC  . ESOPHAGOGASTRODUODENOSCOPY (EGD) WITH PROPOFOL N/A 09/17/2015   Procedure: ESOPHAGOGASTRODUODENOSCOPY (EGD) WITH PROPOFOL;  Surgeon: [name], MD;  Location: [facility];  Service: Endoscopy;  Laterality: N/A;  . ESOPHAGOGASTRODUODENOSCOPY (EGD) WITH PROPOFOL N/A 10/05/2015   Procedure: ESOPHAGOGASTRODUODENOSCOPY (EGD) WITH PROPOFOL;  Surgeon: [name], MD;  Location: [facility];  Service: Endoscopy;  Laterality: N/A;  . EUS N/A 10/15/2015   Procedure: UPPER ENDOSCOPIC ULTRASOUND (EUS) LINEAR;  Surgeon: [name], MD;  Location: [facility];  Service: Gastroenterology;  Laterality: N/A;  . HERNIA REPAIR  2008  umbilical  . VASECTOMY  [id number]    Social History Social History   Tobacco Use  . Smoking status: Current Every Day Smoker    Packs/day: 1.00    Years: 10.00    Pack years: 10.00    Types: Cigarettes  . Smokeless tobacco: Never Used  Vaping Use  . Vaping Use: Never used  Substance Use Topics  . Alcohol use: Yes    Comment: socially   . Drug use: No    Family History Family History  Problem Relation Age of Onset  . Diabetes Mother   . Hypertension Mother   . Cancer Father        colon cancer  . Diabetes  Brother   . Hypertension Brother   . Diabetes Maternal Grandmother   . Hypertension Maternal Grandmother   . Heart disease Maternal Grandmother   . Liver disease Maternal Grandfather   . Cancer Paternal Grandmother   . Stroke Paternal Grandfather   . Diabetes Brother   . Hypertension Brother   No family history of bleeding/clotting disorders, porphyria or autoimmune disease   Allergies  Allergen Reactions  . Atenolol Other (See Comments)    Chest pressure     REVIEW OF SYSTEMS (Negative unless checked)  Constitutional: _0 Weight loss  _1 Fever  _2 Chills Cardiac: _3 Chest pain   _4 Chest pressure   _5 Palpitations   _6 Shortness of breath when laying flat   _7 Shortness of breath with exertion. Vascular:  _8 Pain in legs with walking   _9 Pain in legs at rest  _10 History of DVT   _11 Phlebitis   _12 Swelling in legs   _13 Varicose veins   _14 Non-healing ulcers Pulmonary:   _15 Uses home oxygen   _16 Productive cough   _17 Hemoptysis   _18 Wheeze  _19 COPD   _20 Asthma Neurologic:  _21 Dizziness   _22 Seizures   _23 History of stroke   _24 History of TIA  _25 Aphasia   _26 Vissual changes   _27 Weakness or numbness in arm   _28 Weakness or numbness in leg Musculoskeletal:   _29 Joint swelling   _30 Joint pain   _31 Low back pain Hematologic:  _32 Easy bruising  _33 Easy bleeding   _34 Hypercoagulable state   _35 Anemic Gastrointestinal:  _36 Diarrhea   _37 Vomiting  _38 Gastroesophageal reflux/heartburn   _39 Difficulty swallowing. Genitourinary:  _40 Chronic kidney disease   _41 Difficult urination  _42 Frequent urination   _43 Blood in urine Skin:  _44 Rashes   _45 Ulcers  Psychological:  _46 History of anxiety   _47  History of major depression.  Physical Examination  Vitals:   06/25/20 1342  BP: (!) 171/109  Pulse: 96  Weight: 175 lb (79.4 kg)  Height: _48  (1.727 m)   Body mass index is 26.61 kg/m. Gen: WD/WN, NAD Head: [place]/AT, No temporalis wasting.  Ear/Nose/Throat: Hearing grossly intact, nares w/o erythema or drainage, poor  dentition Eyes: PER, EOMI, sclera nonicteric.  Neck: Supple, no masses.  No bruit or JVD.  Pulmonary:  Good air movement, clear to auscultation bilaterally, no use of accessory muscles.  Cardiac: RRR, normal S1, S2, no Murmurs. Vascular:  Toes of the right foot very slightly dusky Vessel Right Left  Radial Palpable Palpable  PT Not Palpable Not Palpable  DP Not Palpable Not Palpable  Gastrointestinal: soft, non-distended. No guarding/no peritoneal signs.  Musculoskeletal: M/S 5/5 throughout.  No deformity or atrophy.  Neurologic: CN 2-12 intact. Pain and light touch intact in extremities.  Symmetrical.  Speech is fluent. Motor exam as listed above. Psychiatric: Judgment intact, Mood & affect appropriate for pt's clinical situation. Dermatologic: No rashes or ulcers noted.  No changes  consistent with cellulitis.  CBC Lab Results  Component Value Date   WBC 12.3 (H) 08/29/2019   HGB 16.5 08/29/2019   HCT 49.0 08/29/2019   MCV 87.5 08/29/2019   PLT 359 08/29/2019    BMET    Component Value Date/Time   NA 132 (L) 04/16/2020 1606   NA 134 08/17/2015 1448   K 4.2 04/16/2020 1606   CL 94 (L) 04/16/2020 1606   CO2 26 04/16/2020 1606   GLUCOSE 485 (H) 04/16/2020 1606   BUN 14 04/16/2020 1606   BUN 9 08/17/2015 1448   CREATININE 1.18 04/16/2020 1606   CALCIUM 10.0 04/16/2020 1606   GFRNONAA 74 04/16/2020 1606   GFRAA 86 04/16/2020 1606   CrCl cannot be calculated (Patient's most recent lab result is older than the maximum 21 days allowed.).  COAG No results found for: INR, PROTIME  Radiology No results found.    Assessment/Plan 1. Pain of right lower extremity  Recommend:  The patient has atypical pain symptoms for pure atherosclerotic disease. However, on physical exam there is evidence of some degree of arterial disease, given the diminished pulses of the legs.  Noninvasive CT angiogram will be obtained and the patient will follow up with me to review these  studies.  I suspect the patient is c/o pseudoclaudication or advancing diabetic neuropathy.  Patient should have an evaluation of his LS spine which I defer to the primary service.  The patient should continue walking and begin a more formal exercise program. The patient should continue his antiplatelet therapy and aggressive treatment of the lipid abnormalities.  - CT ANGIO AO+BIFEM W & OR WO CONTRAST; Future - CT ANGIO CHEST AORTA W/CM & OR WO/CM; Future  2. PAD (peripheral artery disease) ([place]) See #1 - CT ANGIO AO+BIFEM W & OR WO CONTRAST; Future - CT ANGIO CHEST AORTA W/CM & OR WO/CM; Future  3. Primary osteoarthritis involving multiple joints Continue NSAID medications as already ordered, these medications have been reviewed and there are no changes at this time.  Continued activity and therapy was stressed.   4. Essential hypertension, benign Continue antihypertensive medications as already ordered, these medications have been reviewed and there are no changes at this time.   5. Uncontrolled type 2 diabetes mellitus with hyperglycemia, with long-term current use of insulin (HCC) Continue hypoglycemic medications as already ordered, these medications have been reviewed and there are no changes at this time.  Hgb A1C to be monitored as already arranged by primary service    Hortencia Pilar, MD  07/02/2020 8:24 AM

## 2020-07-17 ENCOUNTER — Other Ambulatory Visit (INDEPENDENT_AMBULATORY_CARE_PROVIDER_SITE_OTHER): Payer: Self-pay | Admitting: Vascular Surgery

## 2020-07-17 DIAGNOSIS — M79604 Pain in right leg: Secondary | ICD-10-CM

## 2020-07-21 ENCOUNTER — Ambulatory Visit (INDEPENDENT_AMBULATORY_CARE_PROVIDER_SITE_OTHER): Payer: BC Managed Care – PPO | Admitting: Nurse Practitioner

## 2020-07-21 ENCOUNTER — Encounter (INDEPENDENT_AMBULATORY_CARE_PROVIDER_SITE_OTHER): Payer: Self-pay

## 2020-07-21 ENCOUNTER — Ambulatory Visit (INDEPENDENT_AMBULATORY_CARE_PROVIDER_SITE_OTHER): Payer: BC Managed Care – PPO

## 2020-07-21 ENCOUNTER — Encounter (INDEPENDENT_AMBULATORY_CARE_PROVIDER_SITE_OTHER): Payer: Self-pay | Admitting: Nurse Practitioner

## 2020-07-21 ENCOUNTER — Other Ambulatory Visit: Payer: Self-pay

## 2020-07-21 VITALS — BP 117/78 | HR 99 | Resp 20 | Ht 68.0 in | Wt 173.0 lb

## 2020-07-21 DIAGNOSIS — I10 Essential (primary) hypertension: Secondary | ICD-10-CM

## 2020-07-21 DIAGNOSIS — I739 Peripheral vascular disease, unspecified: Secondary | ICD-10-CM

## 2020-07-21 DIAGNOSIS — R29898 Other symptoms and signs involving the musculoskeletal system: Secondary | ICD-10-CM

## 2020-07-21 DIAGNOSIS — M79604 Pain in right leg: Secondary | ICD-10-CM

## 2020-07-21 DIAGNOSIS — Z720 Tobacco use: Secondary | ICD-10-CM
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
## 2020-07-27 ENCOUNTER — Telehealth (INDEPENDENT_AMBULATORY_CARE_PROVIDER_SITE_OTHER): Payer: Self-pay

## 2020-07-27 NOTE — Telephone Encounter (Signed)
Spoke with the patient, he is now scheduled with Dr. Delana Meyer for a RLE angio on 08/18/20 with a 6:45 am arrival time to the [facility] Covid testing on 08/14/20 between 8-1 pm at the [facility]. Pre-procedure instructions were discussed and will be mailed. Patient was offered 08/11/20 but declined stating he would be out of town.

## 2020-07-28 DIAGNOSIS — I700 Atherosclerosis of aorta: Secondary | ICD-10-CM | POA: Diagnosis not present

## 2020-07-28 DIAGNOSIS — M5416 Radiculopathy, lumbar region: Secondary | ICD-10-CM | POA: Diagnosis not present

## 2020-07-29 ENCOUNTER — Other Ambulatory Visit: Payer: Self-pay | Admitting: Nurse Practitioner

## 2020-07-29 DIAGNOSIS — M5416 Radiculopathy, lumbar region: Secondary | ICD-10-CM

## 2020-07-30 ENCOUNTER — Ambulatory Visit: Payer: BC Managed Care – PPO | Admitting: Family Medicine

## 2020-07-31 DIAGNOSIS — R200 Anesthesia of skin: Secondary | ICD-10-CM | POA: Diagnosis not present

## 2020-07-31 DIAGNOSIS — R202 Paresthesia of skin: Secondary | ICD-10-CM | POA: Diagnosis not present

## 2020-08-01 ENCOUNTER — Ambulatory Visit: Payer: BC Managed Care – PPO

## 2020-08-14 ENCOUNTER — Other Ambulatory Visit
Admission: RE | Admit: 2020-08-14 | Discharge: 2020-08-14 | Disposition: A | Discharge status: Home / Self Care | Payer: BC Managed Care – PPO | Source: Ambulatory Visit | Attending: Vascular Surgery | Admitting: Vascular Surgery

## 2020-08-14 DIAGNOSIS — Z20822 Contact with and (suspected) exposure to covid-19: Secondary | ICD-10-CM | POA: Insufficient documentation

## 2020-08-14 DIAGNOSIS — Z01812 Encounter for preprocedural laboratory examination: Secondary | ICD-10-CM | POA: Diagnosis not present

## 2020-08-15 LAB — SARS CORONAVIRUS 2 (TAT 6-24 HRS): SARS Coronavirus 2: NEGATIVE

## 2020-08-17 ENCOUNTER — Other Ambulatory Visit (INDEPENDENT_AMBULATORY_CARE_PROVIDER_SITE_OTHER): Payer: Self-pay | Admitting: Nurse Practitioner

## 2020-08-17 ENCOUNTER — Telehealth (INDEPENDENT_AMBULATORY_CARE_PROVIDER_SITE_OTHER): Payer: Self-pay

## 2020-08-17 NOTE — Telephone Encounter (Signed)
Patient called today asking about his procedure and the instructions. I went over the patient's letter that was mailed and gave him the pre-procedure instructions as well as the time of his arrival.

## 2020-08-18 ENCOUNTER — Ambulatory Visit
Admission: RE | Admit: 2020-08-18 | Discharge: 2020-08-18 | Disposition: A | Discharge status: Home / Self Care | Payer: BC Managed Care – PPO | Attending: Vascular Surgery | Admitting: Vascular Surgery

## 2020-08-18 ENCOUNTER — Encounter: Payer: Self-pay | Admitting: Vascular Surgery

## 2020-08-18 ENCOUNTER — Other Ambulatory Visit: Payer: Self-pay

## 2020-08-18 ENCOUNTER — Encounter
Admission: RE | Disposition: A | Discharge status: Home / Self Care | Payer: Self-pay | Source: Home / Self Care | Attending: Vascular Surgery

## 2020-08-18 DIAGNOSIS — E785 Hyperlipidemia, unspecified: Secondary | ICD-10-CM | POA: Diagnosis not present

## 2020-08-18 DIAGNOSIS — I70211 Atherosclerosis of native arteries of extremities with intermittent claudication, right leg: Secondary | ICD-10-CM | POA: Diagnosis not present

## 2020-08-18 DIAGNOSIS — Z794 Long term (current) use of insulin: Secondary | ICD-10-CM | POA: Diagnosis not present

## 2020-08-18 DIAGNOSIS — Z79899 Other long term (current) drug therapy: Secondary | ICD-10-CM | POA: Insufficient documentation

## 2020-08-18 DIAGNOSIS — I70221 Atherosclerosis of native arteries of extremities with rest pain, right leg: Secondary | ICD-10-CM | POA: Insufficient documentation

## 2020-08-18 DIAGNOSIS — I10 Essential (primary) hypertension: Secondary | ICD-10-CM | POA: Insufficient documentation

## 2020-08-18 DIAGNOSIS — Z7982 Long term (current) use of aspirin: Secondary | ICD-10-CM | POA: Insufficient documentation

## 2020-08-18 DIAGNOSIS — E559 Vitamin D deficiency, unspecified: Secondary | ICD-10-CM | POA: Insufficient documentation

## 2020-08-18 DIAGNOSIS — K219 Gastro-esophageal reflux disease without esophagitis: Secondary | ICD-10-CM | POA: Insufficient documentation

## 2020-08-18 DIAGNOSIS — I70229 Atherosclerosis of native arteries of extremities with rest pain, unspecified extremity: Secondary | ICD-10-CM

## 2020-08-18 DIAGNOSIS — F17210 Nicotine dependence, cigarettes, uncomplicated: Secondary | ICD-10-CM | POA: Diagnosis not present

## 2020-08-18 DIAGNOSIS — E1151 Type 2 diabetes mellitus with diabetic peripheral angiopathy without gangrene: Secondary | ICD-10-CM | POA: Insufficient documentation

## 2020-08-18 HISTORY — PX: LOWER EXTREMITY ANGIOGRAPHY: CATH118251

## 2020-08-18 LAB — BUN: BUN: 18 mg/dL (ref 6–20)

## 2020-08-18 LAB — CREATININE, SERUM
Creatinine, Ser: 1.23 mg/dL (ref 0.61–1.24)
GFR calc non Af Amer: >60 mL/min (ref >60)

## 2020-08-18 LAB — GLUCOSE, CAPILLARY
Glucose-Capillary: 353 mg/dL — ABNORMAL HIGH (ref 70–99)
Glucose-Capillary: 302 mg/dL — ABNORMAL HIGH (ref 70–99)

## 2020-08-18 SURGERY — LOWER EXTREMITY ANGIOGRAPHY
Anesthesia: Moderate Sedation | Laterality: Right

## 2020-08-18 MED ORDER — MIDAZOLAM HCL 2 MG/ML PO SYRP: 8.0000 mg | ORAL_SOLUTION | Freq: Once | ORAL | Status: DC | PRN

## 2020-08-18 MED ORDER — MIDAZOLAM HCL 2 MG/2ML IJ SOLN
INTRAMUSCULAR | Status: DC | PRN
  Administered 2020-08-18: 2 mg via INTRAVENOUS

## 2020-08-18 MED ORDER — CLOPIDOGREL BISULFATE 75 MG PO TABS
ORAL_TABLET | ORAL | Status: AC
  Administered 2020-08-18: 300 mg via ORAL
  Filled 2020-08-18: qty 4

## 2020-08-18 MED ORDER — SODIUM CHLORIDE 0.9 % IV SOLN: 250.0000 mL | INTRAVENOUS | Status: DC | PRN

## 2020-08-18 MED ORDER — CEFAZOLIN SODIUM-DEXTROSE 2-4 GM/100ML-% IV SOLN: 2.0000 g | Freq: Once | INTRAVENOUS | Status: AC

## 2020-08-18 MED ORDER — ONDANSETRON HCL 4 MG/2ML IJ SOLN: 4.0000 mg | Freq: Four times a day (QID) | INTRAMUSCULAR | Status: DC | PRN

## 2020-08-18 MED ORDER — HEPARIN SODIUM (PORCINE) 1000 UNIT/ML IJ SOLN
INTRAMUSCULAR | Status: DC | PRN
  Administered 2020-08-18: 5000 [IU] via INTRAVENOUS

## 2020-08-18 MED ORDER — OXYCODONE HCL 5 MG PO TABS: 5.0000 mg | ORAL_TABLET | ORAL | Status: DC | PRN

## 2020-08-18 MED ORDER — SODIUM CHLORIDE 0.9% FLUSH: 3.0000 mL | Freq: Two times a day (BID) | INTRAVENOUS | Status: DC

## 2020-08-18 MED ORDER — METHYLPREDNISOLONE SODIUM SUCC 125 MG IJ SOLR: 125.0000 mg | Freq: Once | INTRAMUSCULAR | Status: DC | PRN

## 2020-08-18 MED ORDER — CEFAZOLIN SODIUM-DEXTROSE 2-4 GM/100ML-% IV SOLN
INTRAVENOUS | Status: AC
  Administered 2020-08-18: 2 g via INTRAVENOUS
  Filled 2020-08-18: qty 100

## 2020-08-18 MED ORDER — FENTANYL CITRATE (PF) 100 MCG/2ML IJ SOLN
INTRAMUSCULAR | Status: DC | PRN
  Administered 2020-08-18: 50 ug via INTRAVENOUS

## 2020-08-18 MED ORDER — HYDROMORPHONE HCL 1 MG/ML IJ SOLN: 1.0000 mg | Freq: Once | INTRAMUSCULAR | Status: DC | PRN

## 2020-08-18 MED ORDER — CLOPIDOGREL BISULFATE 300 MG PO TABS: 300.0000 mg | ORAL_TABLET | ORAL | Status: AC

## 2020-08-18 MED ORDER — FAMOTIDINE 20 MG PO TABS: 40.0000 mg | ORAL_TABLET | Freq: Once | ORAL | Status: DC | PRN

## 2020-08-18 MED ORDER — DIPHENHYDRAMINE HCL 50 MG/ML IJ SOLN: 50.0000 mg | Freq: Once | INTRAMUSCULAR | Status: DC | PRN

## 2020-08-18 MED ORDER — MIDAZOLAM HCL 5 MG/5ML IJ SOLN
INTRAMUSCULAR | Status: AC
  Filled 2020-08-18: qty 5

## 2020-08-18 MED ORDER — MORPHINE SULFATE (PF) 4 MG/ML IV SOLN: 2.0000 mg | INTRAVENOUS | Status: DC | PRN

## 2020-08-18 MED ORDER — IODIXANOL 320 MG/ML IV SOLN
INTRAVENOUS | Status: DC | PRN
  Administered 2020-08-18: 45 mL via INTRA_ARTERIAL

## 2020-08-18 MED ORDER — ACETAMINOPHEN 325 MG PO TABS: 650.0000 mg | ORAL_TABLET | ORAL | Status: DC | PRN

## 2020-08-18 MED ORDER — SODIUM CHLORIDE 0.9 % IV SOLN: INTRAVENOUS | Status: DC

## 2020-08-18 MED ORDER — FENTANYL CITRATE (PF) 100 MCG/2ML IJ SOLN
INTRAMUSCULAR | Status: AC
  Filled 2020-08-18: qty 2

## 2020-08-18 MED ORDER — HEPARIN SODIUM (PORCINE) 1000 UNIT/ML IJ SOLN
INTRAMUSCULAR | Status: AC
  Filled 2020-08-18: qty 1

## 2020-08-18 MED ORDER — CLOPIDOGREL BISULFATE 75 MG PO TABS: 75.0000 mg | ORAL_TABLET | Freq: Every day | ORAL | 11 refills | Status: DC

## 2020-08-18 MED ORDER — SODIUM CHLORIDE 0.9% FLUSH: 3.0000 mL | INTRAVENOUS | Status: DC | PRN

## 2020-08-18 SURGICAL SUPPLY — 14 items
BALLN LUTONIX DCB 5X60X130 (BALLOONS) ×2
BALLOON LUTONIX DCB 5X60X130 (BALLOONS) ×1 IMPLANT
CATH ANGIO 5F PIGTAIL 65CM (CATHETERS) ×2 IMPLANT
COVER PROBE U/S 5X48 (MISCELLANEOUS) ×2 IMPLANT
DEVICE STARCLOSE SE CLOSURE (Vascular Products) ×2 IMPLANT
GLIDEWIRE ADV .035X260CM (WIRE) ×2 IMPLANT
KIT ENCORE 26 ADVANTAGE (KITS) ×2 IMPLANT
NEEDLE ENTRY 21GA 7CM ECHOTIP (NEEDLE) ×2 IMPLANT
PACK ANGIOGRAPHY (CUSTOM PROCEDURE TRAY) ×2 IMPLANT
SET INTRO CAPELLA COAXIAL (SET/KITS/TRAYS/PACK) ×2 IMPLANT
SHEATH BRITE TIP 5FRX11 (SHEATH) ×2 IMPLANT
SHEATH RAABE 6FR (SHEATH) ×2 IMPLANT
TUBING CONTRAST HIGH PRESS 72 (TUBING) ×4 IMPLANT
WIRE J 3MM .035X145CM (WIRE) ×2 IMPLANT

## 2020-08-18 NOTE — Op Note (Signed)
[facility] Percutaneous Study/Intervention Procedural Note   Date of Surgery: 08/18/2020  Surgeon:  Katha Cabal, MD.  Pre-operative Diagnosis: Atherosclerotic occlusive disease bilateral lower extremities with lifestyle limiting claudication right lower extremity  Post-operative diagnosis: Same  Procedure(s) Performed: 1. Introduction catheter into right lower extremity 3rd order catheter placement  2. Contrast injection right lower extremity for distal runoff   3. Percutaneous transluminal angioplasty right popliteal artery  4. Star close closure left common femoral arteriotomy  Anesthesia: Conscious sedation was administered under my direct supervision by the interventional radiology RN. IV Versed plus fentanyl were utilized. Continuous ECG, pulse oximetry and blood pressure was monitored throughout the entire procedure.  Conscious sedation was for a total of 34 minutes.  Sheath: 6 French Raby left common femoral retrograde  Contrast: 45 cc  Fluoroscopy Time: 2.8 minutes  Indications: Henry Fuller presents with lifestyle limiting claudication symptoms.  Physical examination demonstrates nonpalpable pulses of the right foot with an ABI of approximately 0.8.  The risks and benefits are reviewed all questions answered patient agrees to proceed.  Procedure: Henry Fuller is a 44 y.o. y.o. male who was identified and appropriate procedural time out was performed. The patient was then placed supine on the table and prepped and draped in the usual sterile fashion.   Ultrasound was placed in the sterile sleeve and the left groin was evaluated the left common femoral artery was echolucent and pulsatile indicating patency.  Image was recorded for the permanent record and under real-time visualization a microneedle was inserted into the common femoral artery microwire followed by a  micro-sheath.  A J-wire was then advanced through the micro-sheath and a  5 [place] sheath was then inserted over a J-wire. J-wire was then advanced and a 5 French pigtail catheter was positioned at the level of T12. AP projection of the aorta was then obtained. Pigtail catheter was repositioned to above the bifurcation and a LAO view of the pelvis was obtained.  Subsequently a pigtail catheter with the stiff angle Glidewire was used to cross the aortic bifurcation the catheter wire were advanced down into the right distal external iliac artery. Oblique view of the femoral bifurcation was then obtained and subsequently the wire was reintroduced and the pigtail catheter negotiated into the SFA representing third order catheter placement. Distal runoff was then performed.  Diagnostic interpretation: The abdominal aorta is opacified with bolus injection contrast.  There are no hemodynamically significant stenosis.  Aortic bifurcation is widely patent.  The bilateral common external and internal iliac arteries are widely patent.  The right common femoral profunda femoris and superficial femoral demonstrate mild atherosclerotic changes but there are no hemodynamically significant stenoses.  In the mid popliteal at the level of the tibial plateau there is a string sign greater than 95%.  Distally the popliteal artery is patent without hemodynamically significant stenosis.  Trifurcation is diseased with occlusion of the anterior tibial.  Tibioperoneal trunk is patent and the posterior tibial is widely patent down to the foot and the dominant runoff.  The peroneal is patent but somewhat small.  Based on these findings I elected to move forward with intervention.  5000 units of heparin was then given and allowed to circulate and a 6 [place] Raby sheath was advanced up and over the bifurcation and positioned in the femoral artery  The advantage wire was then negotiated through the popliteal lesion and a 5 mm x 60 mm  Lutonix drug-eluting balloon was used to  angioplasty the popliteal arteries. Inflation was to 12 atmospheres for 2 minutes. Follow-up imaging demonstrated patency with less than 5 % residual stenosis. Distal runoff was then reassessed and found to be unchanged.  After review of these images the sheath is pulled into the left external iliac oblique of the common femoral is obtained and a Star close device deployed. There no immediate complications.   Findings:  The abdominal aorta is opacified with bolus injection contrast.  There are no hemodynamically significant stenosis.  Aortic bifurcation is widely patent.  The bilateral common external and internal iliac arteries are widely patent.  The right common femoral profunda femoris and superficial femoral demonstrate mild atherosclerotic changes but there are no hemodynamically significant stenoses.  In the mid popliteal at the level of the tibial plateau there is a string sign greater than 95%.  Distally the popliteal artery is patent without hemodynamically significant stenosis.  Trifurcation is diseased with occlusion of the anterior tibial.  Tibioperoneal trunk is patent and the posterior tibial is widely patent down to the foot and the dominant runoff.  The peroneal is patent but somewhat small.  Following angioplasty the right popliteal is now patent with less than 5% residual stenosis and in-line flow it looks quite nice.  Distal runoff is unchanged posterior tibials dominant    Summary: Successful recanalization right lower extremity for limb salvage    Disposition: Patient was taken to the recovery room in stable condition having tolerated the procedure well.  Henry Fuller, Henry Fuller 08/18/2020,9:07 AM

## 2020-08-18 NOTE — Interval H&P Note (Signed)
History and Physical Interval Note:  08/18/2020 8:04 AM  [name]  has presented today for surgery, with the diagnosis of RT Lower Extremity Angiography   ASO with rest pain   BARD Rep cc: M Godley, S Willey   Pt to have Covid test on 08-14-20.  The various methods of treatment have been discussed with the patient and family. After consideration of risks, benefits and other options for treatment, the patient has consented to  Procedure(s): LOWER EXTREMITY ANGIOGRAPHY (Right) as a surgical intervention.  The patient's history has been reviewed, patient examined, no change in status, stable for surgery.  I have reviewed the patient's chart and labs.  Questions were answered to the patient's satisfaction.     Henry Fuller

## 2020-08-19 ENCOUNTER — Other Ambulatory Visit: Payer: Self-pay | Admitting: Family Medicine

## 2020-08-19 MED ORDER — PREGABALIN 25 MG PO CAPS: 25.0000 mg | ORAL_CAPSULE | Freq: Two times a day (BID) | ORAL | 0 refills | Status: DC

## 2020-08-19 NOTE — Telephone Encounter (Signed)
Pt aware of med refill and he did schedule his appt for the end of this month.

## 2020-08-26 ENCOUNTER — Encounter (INDEPENDENT_AMBULATORY_CARE_PROVIDER_SITE_OTHER): Payer: Self-pay | Admitting: Vascular Surgery

## 2020-08-26 DIAGNOSIS — Z0289 Encounter for other administrative examinations: Secondary | ICD-10-CM

## 2020-09-08 ENCOUNTER — Other Ambulatory Visit (INDEPENDENT_AMBULATORY_CARE_PROVIDER_SITE_OTHER): Payer: Self-pay | Admitting: Vascular Surgery

## 2020-09-08 DIAGNOSIS — Z9862 Peripheral vascular angioplasty status: Secondary | ICD-10-CM

## 2020-09-08 DIAGNOSIS — I70211 Atherosclerosis of native arteries of extremities with intermittent claudication, right leg: Secondary | ICD-10-CM

## 2020-09-09 ENCOUNTER — Ambulatory Visit (INDEPENDENT_AMBULATORY_CARE_PROVIDER_SITE_OTHER): Payer: BC Managed Care – PPO | Admitting: Nurse Practitioner

## 2020-09-09 ENCOUNTER — Encounter (INDEPENDENT_AMBULATORY_CARE_PROVIDER_SITE_OTHER): Payer: Self-pay | Admitting: Nurse Practitioner

## 2020-09-09 ENCOUNTER — Encounter (INDEPENDENT_AMBULATORY_CARE_PROVIDER_SITE_OTHER): Payer: Self-pay | Admitting: Vascular Surgery

## 2020-09-09 ENCOUNTER — Ambulatory Visit (INDEPENDENT_AMBULATORY_CARE_PROVIDER_SITE_OTHER): Payer: BC Managed Care – PPO

## 2020-09-09 ENCOUNTER — Other Ambulatory Visit: Payer: Self-pay

## 2020-09-09 VITALS — BP 125/84 | HR 99 | Resp 16 | Wt 177.9 lb

## 2020-09-09 DIAGNOSIS — I70211 Atherosclerosis of native arteries of extremities with intermittent claudication, right leg: Secondary | ICD-10-CM

## 2020-09-09 DIAGNOSIS — Z720 Tobacco use: Secondary | ICD-10-CM

## 2020-09-09 DIAGNOSIS — Z794 Long term (current) use of insulin: Secondary | ICD-10-CM

## 2020-09-09 DIAGNOSIS — Z9862 Peripheral vascular angioplasty status: Secondary | ICD-10-CM | POA: Diagnosis not present

## 2020-09-09 DIAGNOSIS — E1165 Type 2 diabetes mellitus with hyperglycemia: Secondary | ICD-10-CM | POA: Diagnosis not present

## 2020-09-09 DIAGNOSIS — I739 Peripheral vascular disease, unspecified: Secondary | ICD-10-CM

## 2020-09-09 DIAGNOSIS — M4800 Spinal stenosis, site unspecified: Secondary | ICD-10-CM

## 2020-09-11 ENCOUNTER — Ambulatory Visit (INDEPENDENT_AMBULATORY_CARE_PROVIDER_SITE_OTHER): Payer: BC Managed Care – PPO | Admitting: Family Medicine

## 2020-09-11 ENCOUNTER — Encounter: Payer: Self-pay | Admitting: Family Medicine

## 2020-09-11 ENCOUNTER — Other Ambulatory Visit: Payer: Self-pay

## 2020-09-11 VITALS — BP 134/80 | HR 100 | Temp 98.4°F | Resp 18 | Ht 68.0 in | Wt 175.4 lb

## 2020-09-11 DIAGNOSIS — E782 Mixed hyperlipidemia: Secondary | ICD-10-CM

## 2020-09-11 DIAGNOSIS — E1165 Type 2 diabetes mellitus with hyperglycemia: Secondary | ICD-10-CM

## 2020-09-11 DIAGNOSIS — Z794 Long term (current) use of insulin: Secondary | ICD-10-CM | POA: Diagnosis not present

## 2020-09-11 DIAGNOSIS — I739 Peripheral vascular disease, unspecified: Secondary | ICD-10-CM

## 2020-09-11 DIAGNOSIS — Z23 Encounter for immunization: Secondary | ICD-10-CM | POA: Diagnosis not present

## 2020-09-11 DIAGNOSIS — E1169 Type 2 diabetes mellitus with other specified complication: Secondary | ICD-10-CM | POA: Diagnosis not present

## 2020-09-11 DIAGNOSIS — I10 Essential (primary) hypertension: Secondary | ICD-10-CM | POA: Diagnosis not present

## 2020-09-11 DIAGNOSIS — I7090 Unspecified atherosclerosis: Secondary | ICD-10-CM

## 2020-09-11 MED ORDER — ATORVASTATIN CALCIUM 40 MG PO TABS: 40.0000 mg | ORAL_TABLET | Freq: Every day | ORAL | 3 refills | Status: DC

## 2020-09-11 NOTE — Progress Notes (Signed)
Name: Henry Fuller   MRN: [id number]    DOB: 07-07-1976   Date:09/11/2020       Progress Note  Chief Complaint  Patient presents with  . Diabetes  . Hypertension  . Hyperlipidemia     Subjective:   Henry Fuller is a 44 y.o. male, presents to clinic for routine f/up  IDDM uncontrolled has not follow up with Dr. Gabriel Carina, but he states it should be better and he is being more compliant with insulin, sugars ranging 90-300  PAD severe with risk of ischemia- referred to [facility], he recently had procedure and then f/up with vascular specialists Dx with Atherosclerosis of artery of extremity with rest pain Pt expected LE pain and weakness to get better after the procedure but it hasn't  HTN- managed with lotensin, pt states he is compliant with meds, no SE or concerns BP Readings from Last 3 Encounters:  09/11/20 134/80  09/09/20 125/84  08/18/20 113/85  Pt denies CP, SOB, exertional sx, LE edema, palpitation, Ha's, visual disturbances, lightheadedness, hypotension, syncope.  HLD - on lipitor 40 mg daily   Peripheral neuropathy - on lyrica - getting evaluated by [facility] neurosurgery and neurology, pain severe, he has difficulty with walking any distance, he reports two falls recently.    Current Outpatient Medications:  .  aspirin EC 81 MG tablet, Take 81 mg by mouth daily., Disp: , Rfl:  .  atorvastatin (LIPITOR) 40 MG tablet, Take 1 tablet (40 mg total) by mouth daily., Disp: 90 tablet, Rfl: 3 .  benazepril-hydrochlorthiazide (LOTENSIN HCT) 20-12.5 MG tablet, Take 1 tablet by mouth daily., Disp: 90 tablet, Rfl: 3 .  clomiPHENE (CLOMID) 50 MG tablet, Take 0.5 tablets (25 mg total) by mouth daily. (Patient taking differently: Take 50 mg by mouth every other day. ), Disp: 30 tablet, Rfl: 10 .  clopidogrel (PLAVIX) 75 MG tablet, Take 1 tablet (75 mg total) by mouth daily., Disp: 30 tablet, Rfl: 11 .  DULoxetine (CYMBALTA) 20 MG capsule, Take 1  capsule (20 mg total) by mouth at bedtime., Disp: 90 capsule, Rfl: 3 .  insulin aspart (NOVOLOG FLEXPEN) 100 UNIT/ML FlexPen, Inject 12 Units as directed 3 (three) times daily before meals., Disp: , Rfl:  .  Insulin Glargine, 2 Unit Dial, 300 UNIT/ML SOPN, Inject 50 Units into the skin at bedtime. toujeo, Disp: , Rfl:  .  pregabalin (LYRICA) 25 MG capsule, Take 1 capsule (25 mg total) by mouth 2 (two) times daily., Disp: 60 capsule, Rfl: 0 .  sildenafil (VIAGRA) 100 MG tablet, Take 1 tablet (100 mg total) by mouth daily as needed for erectile dysfunction (take 0.5-1 tablet 1 hour prior to intercourse)., Disp: 10 tablet, Rfl: 3 .  B-D ULTRAFINE III SHORT PEN 31G X 8 MM MISC, , Disp: , Rfl:  .  Continuous Blood Gluc Sensor (FREESTYLE LIBRE 14 DAY SENSOR) MISC, Use 1 kit every 14 (fourteen) days (Patient not taking: Reported on 09/11/2020), Disp: , Rfl:   Patient Active Problem List   Diagnosis Date Noted  . Leg pain 07/02/2020  . PAD (peripheral artery disease) ([place]) 07/02/2020  . Mixed hyperlipidemia due to type 2 diabetes mellitus ([place]) 10/29/2019  . Hypogonadism in male 10/25/2019  . Erectile dysfunction 10/25/2019  . Primary osteoarthritis involving multiple joints 07/25/2019  . Cervical spondylosis 07/10/2019  . CRP elevated 07/10/2019  . Insomnia 07/10/2019  . Central stenosis of spinal canal 12/12/2018  . Foraminal stenosis of cervical region 12/12/2018  .  Cervical neck pain with evidence of disc disease 12/12/2018  . Facet hypertrophy of cervical region 12/12/2018  . Loss of memory 12/12/2018  . Post concussion syndrome 12/12/2018  . Allergic rhinitis 05/02/2018  . Low back pain 12/15/2016  . Sciatica of right side 12/15/2016  . Gastric polyps   . Other diseases of stomach and duodenum   . 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 Atherosclerosis 08/28/2015  . Uncontrolled type 2 diabetes mellitus with hyperglycemia, with long-term current use of insulin ([place]) 08/20/2015  . Tobacco abuse  08/20/2015  . Essential hypertension, benign 08/20/2015  . Abdominal pain, chronic, epigastric 08/17/2015    Past Surgical History:  Procedure Laterality Date  . COLONOSCOPY WITH PROPOFOL N/A 09/17/2015   Procedure: COLONOSCOPY WITH PROPOFOL;  Surgeon: [name], MD;  Location: [place];  Service: Endoscopy;  Laterality: N/A;  DIABETIC  . ESOPHAGOGASTRODUODENOSCOPY (EGD) WITH PROPOFOL N/A 09/17/2015   Procedure: ESOPHAGOGASTRODUODENOSCOPY (EGD) WITH PROPOFOL;  Surgeon: [name], MD;  Location: [place];  Service: Endoscopy;  Laterality: N/A;  . ESOPHAGOGASTRODUODENOSCOPY (EGD) WITH PROPOFOL N/A 10/05/2015   Procedure: ESOPHAGOGASTRODUODENOSCOPY (EGD) WITH PROPOFOL;  Surgeon: [name], MD;  Location: [place];  Service: Endoscopy;  Laterality: N/A;  . EUS N/A 10/15/2015   Procedure: UPPER ENDOSCOPIC ULTRASOUND (EUS) LINEAR;  Surgeon: [name], MD;  Location: [facility];  Service: Gastroenterology;  Laterality: N/A;  . HERNIA REPAIR  [id number]   umbilical  . LOWER EXTREMITY ANGIOGRAPHY Right 08/18/2020   Procedure: LOWER EXTREMITY ANGIOGRAPHY;  Surgeon: Katha Cabal, MD;  Location: [facility];  Service: Cardiovascular;  Laterality: Right;  [facility] VASECTOMY  2001    Family History  Problem Relation Age of Onset  . Diabetes Mother   . Hypertension Mother   . Cancer Father        colon cancer  . Diabetes Brother   . Hypertension Brother   . Diabetes Maternal Grandmother   . Hypertension Maternal Grandmother   . Heart disease Maternal Grandmother   . Liver disease Maternal Grandfather   . Cancer Paternal Grandmother   . Stroke Paternal Grandfather   . Diabetes Brother   . Hypertension Brother     Social History   Tobacco Use  . Smoking status: Current Every Day Smoker    Packs/day: 1.00    Years: 10.00    Pack years: 10.00    Types: Cigarettes  . Smokeless tobacco: Never Used  Vaping Use  . Vaping Use: Never used    Substance Use Topics  . Alcohol use: Yes    Comment: socially   . Drug use: No     Allergies  Allergen Reactions  . Atenolol Other (See Comments)    Chest pressure    Health Maintenance  Topic Date Due  . COVID-19 Vaccine (1) Never done  . FOOT EXAM  11/23/2019  . HEMOGLOBIN A1C  08/20/2020  . COLONOSCOPY  09/16/2020  . OPHTHALMOLOGY EXAM  01/28/2021  . TETANUS/TDAP  06/07/2021  . INFLUENZA VACCINE  Completed  . PNEUMOCOCCAL POLYSACCHARIDE VACCINE AGE 56-64 HIGH RISK  Completed  . Hepatitis C Screening  Completed  . HIV Screening  Completed    Chart Review Today: I personally reviewed active problem list, medication list, allergies, family history, social history, health maintenance, notes from last encounter, lab results, imaging with the patient/caregiver today.   Review of Systems  10 Systems reviewed and are negative for acute change except as noted in the HPI.  Objective:  Vitals:   09/11/20 1510  BP: 134/80  Pulse: 100  Resp: 18  Temp: 98.4 F (36.9 C)  TempSrc: Oral  SpO2: 97%  Weight: 175 lb 6.4 oz (79.6 kg)  Height: '5\' 8"'  (1.727 m)    Body mass index is 26.67 kg/m.  Physical Exam Vitals and nursing note reviewed.  Constitutional:      General: He is not in acute distress.    Appearance: Normal appearance. He is well-developed. He is not ill-appearing, toxic-appearing or diaphoretic.     Interventions: Face mask in place.  HENT:     Head: Normocephalic and atraumatic.     Jaw: No trismus.     Right Ear: External ear normal.     Left Ear: External ear normal.  Eyes:     General: Lids are normal. No scleral icterus.       Right eye: No discharge.        Left eye: No discharge.     Conjunctiva/sclera: Conjunctivae normal.  Neck:     Trachea: Trachea and phonation normal. No tracheal deviation.  Cardiovascular:     Rate and Rhythm: Normal rate and regular rhythm.     Pulses: Normal pulses.          Radial pulses are 2+ on the right side  and 2+ on the left side.     Heart sounds: Normal heart sounds. No murmur heard.  No friction rub. No gallop.   Pulmonary:     Effort: Pulmonary effort is normal. No respiratory distress.     Breath sounds: Normal breath sounds. No stridor. No wheezing, rhonchi or rales.  Abdominal:     General: Bowel sounds are normal. There is no distension.     Palpations: Abdomen is soft.  Skin:    General: Skin is warm and dry.     Coloration: Skin is not jaundiced.     Findings: No rash.     Nails: There is no clubbing.  Neurological:     Mental Status: He is alert. Mental status is at baseline.     Cranial Nerves: No dysarthria or facial asymmetry.     Motor: No tremor or abnormal muscle tone.     Gait: Gait normal.  Psychiatric:        Mood and Affect: Mood normal.        Speech: Speech normal.        Behavior: Behavior normal. Behavior is cooperative.         Assessment & Plan:   1. Uncontrolled type 2 diabetes mellitus with hyperglycemia, with long-term current use of insulin (HCC) Uncontrolled - pt reports improving compliance with insulin, agreed to draw A1C today, but he needs to f/up with his endocrinologist for management - CBC with Differential/Platelet - COMPLETE METABOLIC PANEL WITH GFR - Hemoglobin A1c  2. Essential hypertension, benign BP at goal today, continue lotensin - COMPLETE METABOLIC PANEL WITH GFR  3. Mixed hyperlipidemia due to type 2 diabetes mellitus ([place]) Lipids reviewed, pt compliant with statin - COMPLETE METABOLIC PANEL WITH GFR - atorvastatin (LIPITOR) 40 MG tablet; Take 1 tablet (40 mg total) by mouth daily.  Dispense: 90 tablet; Refill: 3  4. Need for influenza vaccination  - Flu Vaccine QUAD 6+ mos PF IM (Fluarix Quad PF)  5. PAD (peripheral artery disease) (HCC)  I73.9    now on plavix, per vascular specialists, his LE perfusion looks so much better than when I saw him in June  Multiple of acute and chronic complaints and difficulty  working with disability that occurred over a year ago.  He is seeing multiple specialists at [facility] - work up for pain, weakness, neuropathy etc per [employer].  He is pending an MRI.  Encouraged him to f/up with managing specialists and let me know if he had any new paperwork from HR that requires PCP completion.  Return in about 6 months (around 03/12/2021) for Routine follow-up.   Delsa Grana, PA-C 09/11/20 3:36 PM

## 2020-09-11 NOTE — Patient Instructions (Signed)
Call your endocrinologist and follow up with her. I am checking your A1C today but I am not taking over the management of your Diabetes

## 2020-09-12 LAB — COMPLETE METABOLIC PANEL WITH GFR
AG Ratio: 1.4 (calc) (ref 1.0–2.5)
ALT: 15 U/L (ref 9–46)
AST: 11 U/L (ref 10–40)
Albumin: 4.1 g/dL (ref 3.6–5.1)
Alkaline phosphatase (APISO): 102 U/L (ref 36–130)
BUN: 15 mg/dL (ref 7–25)
CO2: 28 mmol/L (ref 20–32)
Calcium: 9.7 mg/dL (ref 8.6–10.3)
Chloride: 97 mmol/L — ABNORMAL LOW (ref 98–110)
Creat: 1.01 mg/dL (ref 0.60–1.35)
GFR, Est African American: 104 mL/min/{1.73_m2} (ref >=60)
GFR, Est Non African American: 89 mL/min/{1.73_m2} (ref >=60)
Globulin: 2.9 g/dL (calc) (ref 1.9–3.7)
Glucose, Bld: 426 mg/dL — ABNORMAL HIGH (ref 65–99)
Potassium: 4.1 mmol/L (ref 3.5–5.3)
Sodium: 134 mmol/L — ABNORMAL LOW (ref 135–146)
Total Bilirubin: 0.8 mg/dL (ref 0.2–1.2)
Total Protein: 7 g/dL (ref 6.1–8.1)

## 2020-09-12 LAB — CBC WITH DIFFERENTIAL/PLATELET
Absolute Monocytes: 821 cells/uL (ref 200–950)
Basophils Absolute: 65 cells/uL (ref 0–200)
Basophils Relative: 0.6 %
Eosinophils Absolute: 410 cells/uL (ref 15–500)
Eosinophils Relative: 3.8 %
HCT: 46.6 % (ref 38.5–50.0)
Hemoglobin: 15.2 g/dL (ref 13.2–17.1)
Lymphs Abs: 3974 cells/uL — ABNORMAL HIGH (ref 850–3900)
MCH: 29.1 pg (ref 27.0–33.0)
MCHC: 32.6 g/dL (ref 32.0–36.0)
MCV: 89.1 fL (ref 80.0–100.0)
MPV: 10 fL (ref 7.5–12.5)
Monocytes Relative: 7.6 %
Neutro Abs: 5530 cells/uL (ref 1500–7800)
Neutrophils Relative %: 51.2 %
Platelets: 352 10*3/uL (ref 140–400)
RBC: 5.23 10*6/uL (ref 4.20–5.80)
RDW: 12.6 % (ref 11.0–15.0)
Total Lymphocyte: 36.8 %
WBC: 10.8 10*3/uL (ref 3.8–10.8)

## 2020-09-12 LAB — HEMOGLOBIN A1C: Hgb A1c MFr Bld: >14 % of total Hgb — ABNORMAL HIGH (ref <5.7)

## 2020-09-13 NOTE — Progress Notes (Signed)
Subjective:    Patient ID: Henry Fuller, male    DOB: 08-07-1976, 44 y.o.   MRN: [id number] Chief Complaint  Patient presents with  . Follow-up    [facility] 3wk post le angio    The patient returns to the office for followup and review status post angiogram with intervention. The patient notes improvement in the lower extremity symptoms.  Unfortunately the patient continues to have numbness and weakness of the lower extremities.  The patient recently had a visit with neurosurgery and it was recommended that he have an MRI.  The patient had some confusion between the angiogram and the MRI.  The patient thought that the MRI was for the purpose of his arterial studies versus his back.  No new ulcers or wounds have occurred since the last visit.  There have been no significant changes to the patient's overall health care.  The patient denies amaurosis fugax or recent TIA symptoms. There are no recent neurological changes noted. The patient denies history of DVT, PE or superficial thrombophlebitis. The patient denies recent episodes of angina or shortness of breath.   ABI's Rt= 1.00 and Lt= 1.08 (previous ABI's Rt= 0.89 and Lt= 1.13) Duplex US of the right lower extremity reveals biphasic waveforms with monophasic/triphasic waveforms of the left lower extremity.  The patient has slightly dampened toe waveforms bilaterally.   Review of Systems  Skin: Negative for wound.  Neurological: Positive for weakness and numbness.  All other systems reviewed and are negative.      Objective:   Physical Exam Vitals reviewed.  HENT:     Head: Normocephalic.  Cardiovascular:     Rate and Rhythm: Normal rate.     Pulses: Normal pulses.  Pulmonary:     Effort: Pulmonary effort is normal.  Neurological:     Mental Status: He is alert and oriented to person, place, and time.     Motor: Weakness present.  Psychiatric:        Mood and Affect: Mood normal.        Behavior: Behavior normal.          Thought Content: Thought content normal.        Judgment: Judgment normal.     BP 125/84 (BP Location: Right Arm)   Pulse 99   Resp 16   Wt 177 lb 13.9 oz (80.7 kg)   BMI 27.05 kg/m   Past Medical History:  Diagnosis Date  . Concussion   . Diabetes mellitus without complication ([place])    type 2  . Diabetic retinopathy associated with controlled type 2 diabetes mellitus ([place]) 05/25/2018   Dr. Ellin Mayhew, Sept 2018  . Erectile dysfunction   . GERD (gastroesophageal reflux disease)   . Hyperlipidemia   . Hypertension    controlled on meds  . Hypogonadism in male   . Loose tooth due to trauma    front  . Vitamin D deficiency     Social History   Socioeconomic History  . Marital status: Married    Spouse name: April  . Number of children: 3  . Years of education: Not on file  . Highest education level: High school graduate  Occupational History  . Not on file  Tobacco Use  . Smoking status: Current Every Day Smoker    Packs/day: 1.00    Years: 10.00    Pack years: 10.00    Types: Cigarettes  . Smokeless tobacco: Never Used  Vaping Use  . Vaping Use: Never used  Substance and Sexual Activity  . Alcohol use: Yes    Comment: socially   . Drug use: No  . Sexual activity: Yes    Partners: Female  Other Topics Concern  . Not on file  Social History Narrative  . Not on file   Social Determinants of Health   Financial Resource Strain:   . Difficulty of Paying Living Expenses: Not on file  Food Insecurity:   . Worried About Charity fundraiser in the Last Year: Not on file  . Ran Out of Food in the Last Year: Not on file  Transportation Needs:   . Lack of Transportation (Medical): Not on file  . Lack of Transportation (Non-Medical): Not on file  Physical Activity:   . Days of Exercise per Week: Not on file  . Minutes of Exercise per Session: Not on file  Stress:   . Feeling of Stress : Not on file  Social Connections:   . Frequency of Communication with  Friends and Family: Not on file  . Frequency of Social Gatherings with Friends and Family: Not on file  . Attends Religious Services: Not on file  . Active Member of Clubs or Organizations: Not on file  . Attends Archivist Meetings: Not on file  . Marital Status: Not on file  Intimate Partner Violence:   . Fear of Current or Ex-Partner: Not on file  . Emotionally Abused: Not on file  . Physically Abused: Not on file  . Sexually Abused: Not on file    Past Surgical History:  Procedure Laterality Date  . COLONOSCOPY WITH PROPOFOL N/A 09/17/2015   Procedure: COLONOSCOPY WITH PROPOFOL;  Surgeon: [name], MD;  Location: [facility];  Service: Endoscopy;  Laterality: N/A;  DIABETIC  . ESOPHAGOGASTRODUODENOSCOPY (EGD) WITH PROPOFOL N/A 09/17/2015   Procedure: ESOPHAGOGASTRODUODENOSCOPY (EGD) WITH PROPOFOL;  Surgeon: [name], MD;  Location: [facility];  Service: Endoscopy;  Laterality: N/A;  . ESOPHAGOGASTRODUODENOSCOPY (EGD) WITH PROPOFOL N/A 10/05/2015   Procedure: ESOPHAGOGASTRODUODENOSCOPY (EGD) WITH PROPOFOL;  Surgeon: [name], MD;  Location: [facility];  Service: Endoscopy;  Laterality: N/A;  . EUS N/A 10/15/2015   Procedure: UPPER ENDOSCOPIC ULTRASOUND (EUS) LINEAR;  Surgeon: [name], MD;  Location: [facility];  Service: Gastroenterology;  Laterality: N/A;  . HERNIA REPAIR  1610   umbilical  . LOWER EXTREMITY ANGIOGRAPHY Right 08/18/2020   Procedure: LOWER EXTREMITY ANGIOGRAPHY;  Surgeon: Katha Cabal, MD;  Location: [facility];  Service: Cardiovascular;  Laterality: Right;  Marland Kitchen VASECTOMY  2001    Family History  Problem Relation Age of Onset  . Diabetes Mother   . Hypertension Mother   . Cancer Father        colon cancer  . Diabetes Brother   . Hypertension Brother   . Diabetes Maternal Grandmother   . Hypertension Maternal Grandmother   . Heart disease Maternal Grandmother   . Liver disease Maternal  Grandfather   . Cancer Paternal Grandmother   . Stroke Paternal Grandfather   . Diabetes Brother   . Hypertension Brother     Allergies  Allergen Reactions  . Atenolol Other (See Comments)    Chest pressure    CBC Latest Ref Rng & Units 09/11/2020 08/29/2019 07/02/2019  WBC 3.8 - 10.8 Thousand/uL 10.8 12.3(H) 10.2  Hemoglobin 13.2 - 17.1 g/dL 15.2 16.5 16.5  Hematocrit 38 - 50 % 46.6 49.0 47.8  Platelets 140 - 400 Thousand/uL 352 359 338  CMP     Component Value Date/Time   NA 134 (L) 09/11/2020 1554   NA 134 08/17/2015 1448   K 4.1 09/11/2020 1554   CL 97 (L) 09/11/2020 1554   CO2 28 09/11/2020 1554   GLUCOSE 426 (H) 09/11/2020 1554   BUN 15 09/11/2020 1554   BUN 9 08/17/2015 1448   CREATININE 1.01 09/11/2020 1554   CALCIUM 9.7 09/11/2020 1554   PROT 7.0 09/11/2020 1554   PROT 7.1 08/17/2015 1448   ALBUMIN 4.2 12/15/2016 0902   ALBUMIN 4.1 08/17/2015 1448   AST 11 09/11/2020 1554   ALT 15 09/11/2020 1554   ALKPHOS 107 12/15/2016 0902   BILITOT 0.8 09/11/2020 1554   BILITOT 0.8 08/17/2015 1448   GFRNONAA 89 09/11/2020 1554   GFRAA 104 09/11/2020 1554          Assessment & Plan:   1. PAD (peripheral artery disease) (HCC)  Recommend:  The patient has evidence of atherosclerosis of the lower extremities with claudication.  The patient does not voice lifestyle limiting changes at this point in time.  Noninvasive studies do not suggest clinically significant change.  No invasive studies, angiography or surgery at this time The patient should continue walking and begin a more formal exercise program.  The patient should continue antiplatelet therapy and aggressive treatment of the lipid abnormalities  No changes in the patient's medications at this time  The patient should continue wearing graduated compression socks 10-15 mmHg strength to control the mild edema.   The patient will follow up in 3 months with noninvasive studies. 2. Tobacco  abuse Smoking cessation was discussed, 3-10 minutes spent on this topic specifically   3. Uncontrolled type 2 diabetes mellitus with hyperglycemia, with long-term current use of insulin (HCC) The patient's most recent A1c was noted to be 14.4.  I had a long discussion with the patient how this affects his peripheral arterial disease and how long-term hyperglycemia can result in microvascular disease as well as worsening PAD.  Patient will continue to work with primary care provider and endocrinologist for good glycemic control. 4. Central stenosis of spinal canal Had a long discussion with the patient as to the different treatment the angiogram as well as the MRI.  We discussed the differences between the 2 as well as what expected results or treatments may be.  The patient is strongly advised to have the MRI as recommended by neurosurgery and to follow-up with neurosurgery as to the results.  The patient's continued weakness is likely a result of his known spinal issues.   Current Outpatient Medications on File Prior to Visit  Medication Sig Dispense Refill  . aspirin EC 81 MG tablet Take 81 mg by mouth daily.    . B-D ULTRAFINE III SHORT PEN 31G X 8 MM MISC  (Patient not taking: Reported on 09/11/2020)    . benazepril-hydrochlorthiazide (LOTENSIN HCT) 20-12.5 MG tablet Take 1 tablet by mouth daily. 90 tablet 3  . clomiPHENE (CLOMID) 50 MG tablet Take 0.5 tablets (25 mg total) by mouth daily. (Patient taking differently: Take 50 mg by mouth every other day. ) 30 tablet 10  . clopidogrel (PLAVIX) 75 MG tablet Take 1 tablet (75 mg total) by mouth daily. 30 tablet 11  . Continuous Blood Gluc Sensor (FREESTYLE LIBRE 14 DAY SENSOR) MISC Use 1 kit every 14 (fourteen) days (Patient not taking: Reported on 09/11/2020)    . insulin aspart (NOVOLOG FLEXPEN) 100 UNIT/ML FlexPen Inject 12 Units as directed 3 (three)  times daily before meals.    . Insulin Glargine, 2 Unit Dial, 300 UNIT/ML SOPN Inject 50  Units into the skin at bedtime. toujeo    . pregabalin (LYRICA) 25 MG capsule Take 1 capsule (25 mg total) by mouth 2 (two) times daily. 60 capsule 0  . sildenafil (VIAGRA) 100 MG tablet Take 1 tablet (100 mg total) by mouth daily as needed for erectile dysfunction (take 0.5-1 tablet 1 hour prior to intercourse). 10 tablet 3   No current facility-administered medications on file prior to visit.    There are no Patient Instructions on file for this visit. No follow-ups on file.   Kris Hartmann, NP

## 2020-09-14 ENCOUNTER — Encounter: Payer: Self-pay | Admitting: Family Medicine

## 2020-09-14 ENCOUNTER — Encounter (INDEPENDENT_AMBULATORY_CARE_PROVIDER_SITE_OTHER): Payer: Self-pay | Admitting: Nurse Practitioner

## 2020-09-14 DIAGNOSIS — Z0289 Encounter for other administrative examinations: Secondary | ICD-10-CM

## 2020-09-15 ENCOUNTER — Encounter: Payer: Self-pay | Admitting: Family Medicine

## 2020-09-17 ENCOUNTER — Other Ambulatory Visit: Payer: Self-pay | Admitting: Family Medicine

## 2020-09-20 ENCOUNTER — Other Ambulatory Visit: Payer: Self-pay | Admitting: Family Medicine

## 2020-09-21 ENCOUNTER — Other Ambulatory Visit: Payer: Self-pay | Admitting: Family Medicine

## 2020-09-21 ENCOUNTER — Other Ambulatory Visit: Payer: Self-pay

## 2020-09-24 ENCOUNTER — Ambulatory Visit
Admission: RE | Admit: 2020-09-24 | Discharge: 2020-09-24 | Disposition: A | Discharge status: Home / Self Care | Payer: BC Managed Care – PPO | Source: Ambulatory Visit | Attending: Nurse Practitioner | Admitting: Nurse Practitioner

## 2020-09-24 ENCOUNTER — Other Ambulatory Visit: Payer: Self-pay

## 2020-09-24 DIAGNOSIS — M5416 Radiculopathy, lumbar region: Secondary | ICD-10-CM | POA: Insufficient documentation

## 2020-09-24 DIAGNOSIS — M545 Low back pain, unspecified: Secondary | ICD-10-CM | POA: Diagnosis not present

## 2020-12-08 ENCOUNTER — Other Ambulatory Visit (INDEPENDENT_AMBULATORY_CARE_PROVIDER_SITE_OTHER): Payer: Self-pay | Admitting: Vascular Surgery

## 2020-12-08 DIAGNOSIS — I739 Peripheral vascular disease, unspecified: Secondary | ICD-10-CM

## 2020-12-10 ENCOUNTER — Ambulatory Visit (INDEPENDENT_AMBULATORY_CARE_PROVIDER_SITE_OTHER): Payer: BC Managed Care – PPO

## 2020-12-10 ENCOUNTER — Other Ambulatory Visit: Payer: Self-pay

## 2020-12-10 ENCOUNTER — Ambulatory Visit (INDEPENDENT_AMBULATORY_CARE_PROVIDER_SITE_OTHER): Payer: BC Managed Care – PPO | Admitting: Vascular Surgery

## 2020-12-10 DIAGNOSIS — I739 Peripheral vascular disease, unspecified: Secondary | ICD-10-CM | POA: Diagnosis not present

## 2020-12-14 ENCOUNTER — Encounter (INDEPENDENT_AMBULATORY_CARE_PROVIDER_SITE_OTHER): Payer: Self-pay | Admitting: *Deleted

## 2021-01-17 ENCOUNTER — Other Ambulatory Visit: Payer: Self-pay | Admitting: Family Medicine

## 2021-02-17 ENCOUNTER — Encounter: Payer: Self-pay | Admitting: Urology

## 2021-02-17 ENCOUNTER — Ambulatory Visit (INDEPENDENT_AMBULATORY_CARE_PROVIDER_SITE_OTHER): Payer: BC Managed Care – PPO | Admitting: Urology

## 2021-02-17 ENCOUNTER — Other Ambulatory Visit: Payer: Self-pay

## 2021-02-17 VITALS — BP 103/69 | HR 108 | Ht 68.0 in | Wt 170.0 lb

## 2021-02-17 DIAGNOSIS — E291 Testicular hypofunction: Secondary | ICD-10-CM

## 2021-02-18 ENCOUNTER — Encounter: Payer: Self-pay | Admitting: Urology

## 2021-02-18 LAB — MICROALBUMIN, URINE: Microalb, Ur: 7

## 2021-02-18 LAB — HEMOGLOBIN A1C: Hemoglobin A1C: 14.4

## 2021-02-18 LAB — TESTOSTERONE: Testosterone: 389 ng/dL (ref 264–916)

## 2021-02-18 LAB — HEMATOCRIT: Hematocrit: 46.3 % (ref 37.5–51.0)

## 2021-02-18 NOTE — Progress Notes (Signed)
02/17/2021 12:49 PM   [place] 1976/11/11 [id number]  Referring provider: Delsa Grana, PA-C [address] [place],  [place] [address]  Chief Complaint  Patient presents with  . Hypogonadism    HPI: 45 y.o. male presents for follow-up of hypogonadism.   Initially seen 09/18/2019 with chronic tiredness, fatigue and decreased libido  T level was 240 with a low normal LH and he initially elected trial of Clomid  Testosterone level improved to 405 however symptoms had not improved and Rx testosterone was sent to pharmacy however he states it ended up not being covered by his insurance and he remains on Clomid  He has not been seen in follow-up since that initial visit but has remained on Clomid  He has noted worsening tiredness, fatigue and decreased libido over the past month  He had been taking sildenafil for ED and states this has not been effective   PMH: Past Medical History:  Diagnosis Date  . Concussion   . Diabetes mellitus without complication ([place])    type 2  . Diabetic retinopathy associated with controlled type 2 diabetes mellitus ([place]) 05/25/2018   Dr. Ellin Mayhew, Sept 2018  . Erectile dysfunction   . GERD (gastroesophageal reflux disease)   . Hyperlipidemia   . Hypertension    controlled on meds  . Hypogonadism in male   . Loose tooth due to trauma    front  . Vitamin D deficiency     Surgical History: Past Surgical History:  Procedure Laterality Date  . COLONOSCOPY WITH PROPOFOL N/A 09/17/2015   Procedure: COLONOSCOPY WITH PROPOFOL;  Surgeon: [name], MD;  Location: [facility];  Service: Endoscopy;  Laterality: N/A;  DIABETIC  . ESOPHAGOGASTRODUODENOSCOPY (EGD) WITH PROPOFOL N/A 09/17/2015   Procedure: ESOPHAGOGASTRODUODENOSCOPY (EGD) WITH PROPOFOL;  Surgeon: [name], MD;  Location: [facility];  Service: Endoscopy;  Laterality: N/A;  . ESOPHAGOGASTRODUODENOSCOPY (EGD) WITH PROPOFOL N/A 10/05/2015    Procedure: ESOPHAGOGASTRODUODENOSCOPY (EGD) WITH PROPOFOL;  Surgeon: [name], MD;  Location: [facility];  Service: Endoscopy;  Laterality: N/A;  . EUS N/A 10/15/2015   Procedure: UPPER ENDOSCOPIC ULTRASOUND (EUS) LINEAR;  Surgeon: [name], MD;  Location: [facility];  Service: Gastroenterology;  Laterality: N/A;  . HERNIA REPAIR  1740   umbilical  . LOWER EXTREMITY ANGIOGRAPHY Right 08/18/2020   Procedure: LOWER EXTREMITY ANGIOGRAPHY;  Surgeon: Katha Cabal, MD;  Location: [facility];  Service: Cardiovascular;  Laterality: Right;  Marland Kitchen VASECTOMY  2001    Home Medications:  Allergies as of 02/17/2021      Reactions   Atenolol Other (See Comments)   Chest pressure      Medication List       Accurate as of February 17, 2021 11:59 PM. If you have any questions, ask your nurse or doctor.        aspirin EC 81 MG tablet Take 81 mg by mouth daily.   atorvastatin 40 MG tablet Commonly known as: LIPITOR Take 1 tablet (40 mg total) by mouth daily.   B-D ULTRAFINE III SHORT PEN 31G X 8 MM Misc Generic drug: Insulin Pen Needle   benazepril-hydrochlorthiazide 20-12.5 MG tablet Commonly known as: LOTENSIN HCT Take 1 tablet by mouth daily.   clomiPHENE 50 MG tablet Commonly known as: CLOMID Take 0.5 tablets (25 mg total) by mouth daily. What changed:   how much to take  when to take this   clopidogrel 75 MG tablet Commonly known as: Plavix  Take 1 tablet (75 mg total) by mouth daily.   FreeStyle Libre 14 Day Sensor Misc Use 1 kit every 14 (fourteen) days   insulin glargine (2 Unit Dial) 300 UNIT/ML Solostar Pen Commonly known as: TOUJEO MAX Inject 50 Units into the skin at bedtime. toujeo   NovoLOG FlexPen 100 UNIT/ML FlexPen Generic drug: insulin aspart Inject 12 Units as directed 3 (three) times daily before meals.   pregabalin 25 MG capsule Commonly known as: LYRICA Take 1 capsule by mouth twice daily   sildenafil 100 MG  tablet Commonly known as: VIAGRA Take 1 tablet (100 mg total) by mouth daily as needed for erectile dysfunction (take 0.5-1 tablet 1 hour prior to intercourse).       Allergies:  Allergies  Allergen Reactions  . Atenolol Other (See Comments)    Chest pressure    Family History: Family History  Problem Relation Age of Onset  . Diabetes Mother   . Hypertension Mother   . Cancer Father        colon cancer  . Diabetes Brother   . Hypertension Brother   . Diabetes Maternal Grandmother   . Hypertension Maternal Grandmother   . Heart disease Maternal Grandmother   . Liver disease Maternal Grandfather   . Cancer Paternal Grandmother   . Stroke Paternal Grandfather   . Diabetes Brother   . Hypertension Brother     Social History:  reports that he has been smoking cigarettes. He has a 10.00 pack-year smoking history. He has never used smokeless tobacco. He reports current alcohol use. He reports that he does not use drugs.   Physical Exam: BP 103/69   Pulse (!) 108   Ht '5\' 8"'  (1.727 m)   Wt 170 lb (77.1 kg)   BMI 25.85 kg/m   Constitutional:  Alert and oriented, No acute distress. HEENT: [place] AT, moist mucus membranes.  Trachea midline, no masses. Cardiovascular: No clubbing, cyanosis, or edema. Respiratory: Normal respiratory effort, no increased work of breathing.   Assessment & Plan:    1. Hypogonadism in male  States symptoms improved on Clomid but recently have her worsened  Testosterone and hematocrit ordered today  Will contact with results and further recommendations   Abbie Sons, MD  [facility] [address] [place], [place] [address] [phone]

## 2021-02-21 ENCOUNTER — Telehealth: Payer: Self-pay | Admitting: Urology

## 2021-02-22 ENCOUNTER — Telehealth: Payer: Self-pay | Admitting: *Deleted

## 2021-02-22 MED ORDER — TESTOSTERONE CYPIONATE 200 MG/ML IM SOLN: 200.0000 mg | INTRAMUSCULAR | 0 refills | Status: DC

## 2021-02-22 NOTE — Addendum Note (Signed)
Addended by: John Giovanni C on: 02/22/2021 10:15 AM   Modules accepted: Orders

## 2021-02-22 NOTE — Telephone Encounter (Signed)
Notified patient as instructed, Patient would to try the testosterone injections.

## 2021-02-22 NOTE — Telephone Encounter (Signed)
Rx sent to pharmacy.  Needs an appointment for first injection and injection training and a office visit with testosterone level prior ~ 6 weeks after he starts injections

## 2021-02-22 NOTE — Telephone Encounter (Signed)
-----   Message from Abbie Sons, MD sent at 02/21/2021 11:50 AM EDT ----- Testosterone level low normal at 389.  Does he want to discontinue Clomid and switch to testosterone.  He will need to decide whether he wants testosterone gel or injections.

## 2021-02-24 ENCOUNTER — Other Ambulatory Visit: Payer: Self-pay | Admitting: Family Medicine

## 2021-02-24 NOTE — Telephone Encounter (Signed)
Patient notified the PA for Testosterone was denied. Patient was informed the Goodrx app has the Testosterone at a low price. He would like the medication changed to the [facility].

## 2021-02-28 MED ORDER — TESTOSTERONE CYPIONATE 200 MG/ML IM SOLN
200.0000 mg | INTRAMUSCULAR | 0 refills | Status: DC
Start: 2021-02-28 — End: 2021-03-22

## 2021-02-28 NOTE — Telephone Encounter (Signed)
error 

## 2021-02-28 NOTE — Telephone Encounter (Signed)
[facility] to [facility]  Please schedule follow-up office visit with me in 6 weeks with testosterone level prior

## 2021-03-02 ENCOUNTER — Other Ambulatory Visit: Payer: Self-pay | Admitting: *Deleted

## 2021-03-02 DIAGNOSIS — E291 Testicular hypofunction: Secondary | ICD-10-CM

## 2021-03-03 ENCOUNTER — Other Ambulatory Visit: Payer: Self-pay | Admitting: Family Medicine

## 2021-03-03 MED ORDER — "SAFETY SYRINGE/NEEDLE 23G X 1"" 3 ML MISC": 0 refills | Status: AC

## 2021-03-03 MED ORDER — "BD DISP NEEDLES 18G X 1-1/2"" MISC": 0 refills | Status: AC

## 2021-03-03 NOTE — Telephone Encounter (Signed)
Patient called and states he picked up the testosterone and needed an appointment for the teaching. Also needles were sent to the pharmacy for patient to do self injections. Follow up appointment for lab and OV have been made.

## 2021-03-04 ENCOUNTER — Encounter: Payer: Self-pay | Admitting: Urology

## 2021-03-04 ENCOUNTER — Other Ambulatory Visit: Payer: Self-pay

## 2021-03-04 ENCOUNTER — Ambulatory Visit (INDEPENDENT_AMBULATORY_CARE_PROVIDER_SITE_OTHER): Payer: BC Managed Care – PPO | Admitting: Urology

## 2021-03-04 VITALS — BP 134/87 | HR 102 | Ht 68.0 in | Wt 170.0 lb

## 2021-03-04 DIAGNOSIS — E349 Endocrine disorder, unspecified: Secondary | ICD-10-CM | POA: Diagnosis not present

## 2021-03-04 MED ORDER — XYOSTED 50 MG/0.5ML ~~LOC~~ SOAJ: 50.0000 mg | SUBCUTANEOUS | 3 refills | Status: DC

## 2021-03-04 NOTE — Progress Notes (Signed)
Henry Fuller is a 45 y.o.  male with testosterone deficiency who presents today for instruction on delivering an IM injection of testosterone cypionate.    I instructed the patient to identify the concentration of his testosterone. Testosterone for injection is usually in the form of testosterone cypionate. These liquids come in multiple concentrations, so before giving an injection, it's very important to make sure that his intended dosage takes into account the concentration of the testosterone serum. Usually, testosterone comes in a concentration of either 100 mg/ml or 200 mg/ml.  We typically use the 200 mg/mL in this office.  He received the 1 mL vial of 200 mg/mL Lot # [id number]  Exp. 08/2023.  He did not feel comfortable with self injection, so gave him the injection today.  Testosterone IM Injection  Due to Hypogonadism patient is present today for a Testosterone Injection.  Medication: Testosterone Cypionate Dose: 1 cc (200mg /mL) Location: left upper outer buttocks Lot: [id number] Exp:08/2023  Patient tolerated well, no complications were noted  Performed by: Myself   Follow up: 14 days   I advised him that If, after injection, he experienced redness, swelling, or discomfort beyond that of normal soreness at the site of injection, call our office for an appointment and instructions.  He is to always store his medication at the recommended temperature, and always check the expiration date on the bottle. If it's expired, don't use it.  Of course, keep all of med's out of reach of children.  Do not change his dose without consulting your provider.  His starting dose will be 1 cc every 2 weeks.  He will return 1 week after his fourth injection for a testosterone level  [name], PA-C   I spent 15 minutes on the day of the encounter to include pre-visit record review, face-to-face time with the patient, and post-visit ordering of tests.

## 2021-03-06 ENCOUNTER — Telehealth: Payer: BC Managed Care – PPO | Admitting: Emergency Medicine

## 2021-03-06 DIAGNOSIS — M79673 Pain in unspecified foot: Secondary | ICD-10-CM

## 2021-03-06 NOTE — Progress Notes (Signed)
Henry Fuller,  Based on what you shared with me,  your pain is severe, sudden onset, no prior diagnosis of gout and history of diabetes, I feel your condition warrants further evaluation and I recommend that you be seen in a face to face office visit where a thorough physical exam can be performed and possible imaging or labs may be ordered to help confirm a diagnosis for most appropriate treatment.    NOTE: If you entered your credit card information for this eVisit, you will not be charged. You may see a "hold" on your card for the $35 but that hold will drop off and you will not have a charge processed.   If you are having a true medical emergency please call 911.      For an urgent face to face visit, [facility] has six urgent care centers for your convenience:     [facility] [place] Get Driving Directions [phone] [address] [place], [place] [address] . 8 am - 4 pm Monday - Friday    [facility]) Get Driving Directions [phone] [address] , [place] [address] . 8 am to 8 pm Monday-Friday . 10 am to 6 pm [facility]) Get Driving Directions [phone]  [address] [place],  [place]  [address] . 8 am to 8 pm Monday-Friday . 8 am to 4 pm [facility] Get Driving Directions [phone] [place], [place], [place] [address] . 8 am to 8 pm Monday-Friday . 8 am to 4 pm [facility] Get Driving Directions  [phone] [address] [place], [place] [address] . 8 am to 8 pm Monday-Friday . 8 am to 4 pm [facility] [place] Get Driving Directions [phone] [address], [place], [place] [address] . 8 am to 8 pm Monday-Friday . 8 am  to 4 pm Saturday-Sunday     Your MyChart E-visit questionnaire answers were reviewed by a board certified advanced clinical practitioner to complete your personal care plan based on your specific symptoms.  Thank you for using e-Visits.   Approximately 5 minutes was spent documenting and reviewing patient's chart.

## 2021-03-12 ENCOUNTER — Ambulatory Visit (INDEPENDENT_AMBULATORY_CARE_PROVIDER_SITE_OTHER): Payer: BC Managed Care – PPO | Admitting: Family Medicine

## 2021-03-12 ENCOUNTER — Other Ambulatory Visit: Payer: Self-pay

## 2021-03-12 ENCOUNTER — Encounter: Payer: Self-pay | Admitting: Family Medicine

## 2021-03-12 VITALS — BP 120/76 | HR 100 | Temp 98.1°F | Resp 16 | Ht 68.0 in | Wt 187.6 lb

## 2021-03-12 DIAGNOSIS — F331 Major depressive disorder, recurrent, moderate: Secondary | ICD-10-CM | POA: Insufficient documentation

## 2021-03-12 DIAGNOSIS — Z5181 Encounter for therapeutic drug level monitoring: Secondary | ICD-10-CM | POA: Insufficient documentation

## 2021-03-12 DIAGNOSIS — Z9119 Patient's noncompliance with other medical treatment and regimen: Secondary | ICD-10-CM

## 2021-03-12 DIAGNOSIS — I739 Peripheral vascular disease, unspecified: Secondary | ICD-10-CM | POA: Diagnosis not present

## 2021-03-12 DIAGNOSIS — Z794 Long term (current) use of insulin: Secondary | ICD-10-CM | POA: Diagnosis not present

## 2021-03-12 DIAGNOSIS — I7090 Unspecified atherosclerosis: Secondary | ICD-10-CM

## 2021-03-12 DIAGNOSIS — M79673 Pain in unspecified foot: Secondary | ICD-10-CM

## 2021-03-12 DIAGNOSIS — I10 Essential (primary) hypertension: Secondary | ICD-10-CM

## 2021-03-12 DIAGNOSIS — E1169 Type 2 diabetes mellitus with other specified complication: Secondary | ICD-10-CM | POA: Diagnosis not present

## 2021-03-12 DIAGNOSIS — I70229 Atherosclerosis of native arteries of extremities with rest pain, unspecified extremity: Secondary | ICD-10-CM

## 2021-03-12 DIAGNOSIS — E291 Testicular hypofunction: Secondary | ICD-10-CM

## 2021-03-12 DIAGNOSIS — E1165 Type 2 diabetes mellitus with hyperglycemia: Secondary | ICD-10-CM

## 2021-03-12 DIAGNOSIS — E782 Mixed hyperlipidemia: Secondary | ICD-10-CM | POA: Diagnosis not present

## 2021-03-12 DIAGNOSIS — Z91199 Patient's noncompliance with other medical treatment and regimen due to unspecified reason: Secondary | ICD-10-CM

## 2021-03-12 DIAGNOSIS — Z720 Tobacco use: Secondary | ICD-10-CM

## 2021-03-12 DIAGNOSIS — F325 Major depressive disorder, single episode, in full remission: Secondary | ICD-10-CM

## 2021-03-12 DIAGNOSIS — Z1211 Encounter for screening for malignant neoplasm of colon: Secondary | ICD-10-CM

## 2021-03-12 DIAGNOSIS — M7918 Myalgia, other site: Secondary | ICD-10-CM | POA: Insufficient documentation

## 2021-03-12 DIAGNOSIS — M533 Sacrococcygeal disorders, not elsewhere classified: Secondary | ICD-10-CM | POA: Insufficient documentation

## 2021-03-12 DIAGNOSIS — Z7902 Long term (current) use of antithrombotics/antiplatelets: Secondary | ICD-10-CM

## 2021-03-12 DIAGNOSIS — M4802 Spinal stenosis, cervical region: Secondary | ICD-10-CM

## 2021-03-12 MED ORDER — CLOPIDOGREL BISULFATE 75 MG PO TABS: 75.0000 mg | ORAL_TABLET | Freq: Every day | ORAL | 11 refills | Status: AC

## 2021-03-12 MED ORDER — ATORVASTATIN CALCIUM 40 MG PO TABS: 40.0000 mg | ORAL_TABLET | Freq: Every day | ORAL | 11 refills | Status: AC

## 2021-03-12 MED ORDER — BENAZEPRIL-HYDROCHLOROTHIAZIDE 20-12.5 MG PO TABS: 1.0000 | ORAL_TABLET | Freq: Every day | ORAL | 11 refills | Status: AC

## 2021-03-12 NOTE — Patient Instructions (Addendum)
Need to f/up ASAP with endocrinology - Dr. Gabriel Carina or Dr. Honor Junes  Follow up with GI specialist Dr. Allen Norris - due for repeat screening  Start plavix daily again - it will be a long term treatment for maintaining blood flow to extremities with your past procedure and with history of peripheral artery disease  See handouts and your care team info below:  Team Member Role and Specialty Contact Info Address  PCPs     Laurell Roof [facility] (Family Medicine) Phone: [phone] Fax: [phone]  [address]  [place] [address]  Additional Team Members     Schnier, [name], MD   (Vascular Surgery) Phone: [phone] Fax: [phone]  [address]  [place] [address]  Abbie Sons, MD   (Urology) Phone: [phone] Fax: [phone]  [address]  [place] [address]  Vladimir Crofts, MD    Consulting Physician (Neurology) Phone: [phone] Fax: [phone]  [address] [facility] [place] [address]  [name], MD    Consulting Physician (Gastroenterology) Phone: [phone] Fax: [phone]  [address] [address]  Lonell Face, NP    Nurse Practitioner (Neurosurgery) Phone: [phone] Fax: [phone]  [address] [place] [address]  Judi Cong, MD    Physician Assistant (Endocrinology) Phone: [phone] Fax: [phone]  [address] [facility] [place] [address]      Peripheral Vascular Disease  Peripheral vascular disease (PVD) is a disease of the blood vessels that carry blood from the heart to the rest of the body. PVD is also called peripheral artery disease (PAD) or poor circulation. PVD affects most of the body. But it affects the legs and feet the most. PVD can lead to acute limb ischemia. This happens when there is a sudden stop of blood flow to an arm or leg. This is a medical emergency. What are the causes? The  most common cause of PVD is a buildup of a fatty substance (plaque) inside your arteries. This decreases blood flow. Plaque can break off and block blood in a smaller artery. This can lead to acute limb ischemia. Other common causes of PVD include:  Blood clots inside the blood vessels.  Injuries to blood vessels.  Irritation and swelling of blood vessels.  Sudden tightening of the blood vessel (spasms). What increases the risk?  A family history of PVD.  Medical conditions, including: ? High cholesterol. ? Diabetes. ? High blood pressure. ? Heart disease. ? Past problems with blood clots. ? Past injury, such as burns or a broken bone.  Other conditions, such as: ? Buerger's disease. This is caused by swollen or irritated blood vessels in your hands and feet. ? Arthritis. ? Birth defects that affect the arteries in your legs. ? Kidney disease.  Using tobacco or nicotine products.  Not getting enough exercise.  Being very overweight (obese).  Being 34 years old or older. What are the signs or symptoms?  Cramps in your butt, legs, and feet.  Pain and weakness in your legs when you are active that goes away when you rest.  Leg pain when at rest.  Leg numbness, tingling, or weakness.  Coldness in a leg or foot, especially when compared with the other leg or foot.  Skin or hair changes. These can include: ? Hair loss. ? Shiny skin. ? Pale or bluish skin. ? Thick toenails.  Being unable to get or keep  an erection.  Tiredness (fatigue).  Weak pulse or no pulse in the feet.  Wounds and sores on the toes, feet, or legs. These take longer to heal. How is this treated? Underlying causes are treated first. Other conditions, like diabetes, high cholesterol, and blood pressure, are also treated. Treatment may include:  Lifestyle changes, such as: ? Quitting smoking. ? Getting regular exercise. ? Having a diet low in fat and cholesterol. ? Not drinking  alcohol.  Taking medicines, such as: ? Blood thinners. ? Medicines to improve blood flow. ? Medicines to improve your blood cholesterol.  Procedures to: ? Open the arteries and restore blood flow. ? Insert a small mesh tube (stent) to keep a blocked vessel open. ? Create a new path for blood to flow to the body (peripheral bypass). ? Remove dead tissue from a wound. ? Remove an affected leg or arm. Follow these instructions at home: Medicines  Take over-the-counter and prescription medicines only as told by your doctor.  If you are taking blood thinners: ? Talk with your doctor before you take any medicines that have aspirin, or NSAIDs, such as ibuprofen. ? Take medicines exactly as told. Take them at the same time each day. ? Avoid doing things that could hurt or bruise you. Take action to prevent falls. ? Wear an alert bracelet or carry a card that shows you are taking blood thinners. Lifestyle  Get regular exercise. Ask your doctor about how to stay active.  Talk with your doctor about keeping a healthy weight. If needed, ask about losing weight.  Eat a diet that is low in fat and cholesterol. If you need help, talk with your doctor.  Do not drink alcohol.  Do not smoke or use any products that contain nicotine or tobacco. If you need help quitting, ask your doctor.      General instructions  Take good care of your feet. To do this: ? Wear shoes that fit well and feel good. ? Check your feet often for any cuts or sores.  Get a flu shot (influenza vaccine) each year.  Keep all follow-up visits. Where to find more information  Society for Vascular Surgery: vascular.org  American Heart Association: heart.org  National Heart, Lung, and Blood Institute: https://www.hartman-hill.biz/ Contact a doctor if:  You have cramps in your legs when you walk.  You have leg pain when you rest.  Your leg or foot feels cold.  Your skin changes.  You cannot get or keep an erection.  You  have cuts or sores on your legs or feet that do not heal. Get help right away if:  You have sudden changes in the color and feeling of your arms or legs, such as: ? Your arm or leg turns cold, numb, and blue. ? Your arm or leg becomes red, warm, swollen, painful, or numb.  You have any signs of a stroke. "BE FAST" is an easy way to remember the main warning signs: ? B - Balance. Dizziness, sudden trouble walking, or loss of balance. ? E - Eyes. Trouble seeing or a change in how you see. ? F - Face. Sudden weakness or loss of feeling of the face. The face or eyelid may droop on one side. ? A - Arms. Weakness or loss of feeling in an arm. This happens all of a sudden and most often on one side of the body. ? S - Speech. Sudden trouble speaking, slurred speech, or trouble understanding what people say. ? T -  Time. Time to call emergency services. Write down what time symptoms started.  You have other signs of a stroke, such as: ? A sudden, very bad headache with no known cause. ? Feeling like you may vomit (nausea). ? Vomiting. ? A seizure.  You have chest pain or trouble breathing. These symptoms may be an emergency. Get help right away. Call your local emergency services (911 in the [place]  Do not wait to see if the symptoms will go away.  Do not drive yourself to the hospital. Summary  Peripheral vascular disease (PVD) is a disease of the blood vessels.  PVD affects the legs and feet the most.  Symptoms may include leg pain or leg numbness, tingling, and weakness.  Treatment may include lifestyle changes, medicines, and procedures. This information is not intended to replace advice given to you by your health care provider. Make sure you discuss any questions you have with your health care provider. Document Revised: 05/04/2020 Document Reviewed: 05/04/2020 Elsevier Patient Education  2021 [place].     Insulin Treatment for Diabetes Mellitus Diabetes, also known as  diabetes mellitus, is a long-term (chronic) disease. It occurs when the body does not properly use sugar (glucose) that is released from food after digestion. Glucose levels are controlled by a hormone called insulin. Insulin is made in the pancreas, which is an organ behind the stomach.  If you have type 1 diabetes, you must take insulin because your pancreas does not make any.  If you have type 2 diabetes, you might need to take insulin along with other medicines. With type 2 diabetes, you will have one or both of these problems: ? Your pancreas does not make enough insulin. ? Cells in your body do not respond properly to insulin that your body makes (insulin resistance). You must use insulin correctly to control your diabetes. You must have some insulin in your body at all times. Insulin treatment varies depending on your type of diabetes, your treatment goals, and your medical history. Ask questions to understand your insulin treatment plan so you can be an active partner in managing your diabetes. How is insulin given? Insulin can be given only through a shot (injection). It is injected using a syringe and needle, an insulin pen, a pump, or a jet injector. Your health care provider will:  Prescribe the type and amount of insulin that you need.  Tell you when you should inject your insulin. Where on the body should insulin be injected? Insulin is injected into a layer of fatty tissue under your skin. Good places to inject insulin include:  Abdomen. Generally, the abdomen is the best place to inject insulin. However, you should avoid any area that is less than 2 inches (5 cm) from your belly button.  Front of thigh.  Upper, outer side of thigh.  Upper, outer side of arm.  Upper, outer part of buttock. It is important to:  Give your injection in a slightly different place each time. This helps to prevent irritation and improve absorption.  Avoid injecting into areas that have scar  tissue. Usually, you will give yourself insulin injections. Other people can also be taught how to give you injections. You will use a special type of syringe that is made only for insulin. Some people may have an insulin pump that delivers insulin steadily through a tube (cannula) that is placed under the skin.   What are the different types of insulin? The different types of insulin are described  below. Specifics vary depending on the insulin product that your health care provider prescribes. Rapid-acting insulin:  Starts working quickly, within 15 minutes.  Can last for 4-5 hours.  Works well when taken right before a meal to quickly lower your blood glucose. Short-acting insulin:  Starts working in about 30 minutes.  Can last for 3-6 hours.  Should be taken about 30 minutes before you start eating a meal. Intermediate-acting insulin:  Starts working in 2-4 hours.  Lasts for about 12-18 hours.  Lowers your blood glucose for a longer period of time. However, it does not work as well for lowering blood glucose right after a meal. Long-acting insulin:  Mimics the small amount of insulin that your pancreas usually makes throughout the day.  Should be used one or two times a day.  Is usually used in combination with other types of insulin or other medicines. Concentrated insulin, or U-500 insulin:  Contains a higher dose of insulin than most rapid-acting insulins. U-500 insulin has 5 times the amount of insulin per 1 mL.  Should be used only with the special U-500 syringe or U-500 insulin pen. Do not use another type of syringe with this insulin. The wrong type of syringe can cause serious problems. What are the side effects of insulin? Possible side effects of insulin treatment include:  Low blood glucose (hypoglycemia).  Weight gain.  Bruising or irritation at the injection site. Some of these side effects can be caused by incorrect insulin doses and improper injection  technique. Be sure to learn how to inject insulin properly. What are common terms associated with insulin treatment? Some terms that you might hear include: Basal insulin, or basal rate  This is the constant amount of insulin that you need to have in your body to keep your blood glucose levels stable. People who have type 1 diabetes need basal insulin in a nonstop (continuous) or steady dose 24 hours a day.  Usually, intermediate-acting or long-acting insulin is used one or two times a day to manage glucose levels. Prandial or nutrition insulin  This refers to meal-related insulin.  Blood glucose rises quickly after a meal (postprandial). Rapid-acting or short-acting insulin can be used right before a meal (preprandial) to quickly lower your blood glucose.  You may be told to adjust the amount of prandial insulin that you take based on how much carbohydrate is in your meal. Correction insulin This may also be called a correction dose or supplemental dose. This is a small amount of rapid-acting or short-acting insulin that can be used to lower your blood glucose if it is too high. You may be told to check your blood glucose at certain times of the day and use correction insulin as needed. Tight control, or intensive therapy This means keeping your blood glucose as close to your target as possible, and preventing your blood glucose from getting too high after meals. People who have tight control of their diabetes have fewer long-term problems caused by diabetes. Follow these instructions at home: Talk with your health care provider or pharmacist about the type of insulin you should take and when you should take it. You should know when your insulin goes up the most (peaks) and when it wears off. You need this information so you can plan your meals and exercise. Eating and drinking  Follow instructions from your health care provider about a healthy meal plan. Do not skip meals.  Drink enough  fluid to keep your urine pale yellow.  Follow your sick day plan whenever you cannot eat or drink normally. Make this plan in advance with your health care provider. Lifestyle  Work with your health care provider to manage your weight, blood pressure, cholesterol, and stress.  Exercise regularly.  Avoid drinking alcohol.  Avoid using any products that contain nicotine or tobacco, such as cigarettes, e-cigarettes, and chewing tobacco. If you need help quitting, ask your health care provider. General instructions  Check your blood glucose as told. Your health care provider will tell you how often and when you should check your blood glucose.  Make sure to check your blood glucose before and after you exercise. If you exercise longer or in a different way than usual, check your blood glucose more often.  Make sure you know the symptoms of high and low blood sugar and how to treat these.  Take over-the-counter and prescription medicines only as told by your health care provider.  Carry a medical alert card or wear medical alert jewelry.  Keep all follow-up visits as told by your health care provider. This is important.   Summary  Diabetes is a long-term disease. It occurs when the body does not properly use sugar (glucose) that is released from food after digestion. Glucose levels are controlled by insulin.  You must use insulin correctly to control your diabetes. You must have some insulin in your body at all times.  Insulin treatment varies depending on your type of diabetes, your treatment goals, and your medical history.  Talk with your health care provider or pharmacist about the type of insulin you should take and when you should take it.  Check your blood glucose as told by your health care provider. Your health care provider will tell you how often and when you should check your blood glucose. This information is not intended to replace advice given to you by your health care  provider. Make sure you discuss any questions you have with your health care provider. Document Revised: 10/15/2019 Document Reviewed: 10/17/2019 Elsevier Patient Education  [place].    Steps to Quit Smoking Smoking tobacco is the leading cause of preventable death. It can affect almost every organ in the body. Smoking puts you and people around you at risk for many serious, long-lasting (chronic) diseases. Quitting smoking can be hard, but it is one of the best things that you can do for your health. It is never too late to quit. How do I get ready to quit? When you decide to quit smoking, make a plan to help you succeed. Before you quit:  Pick a date to quit. Set a date within the next 2 weeks to give you time to prepare.  Write down the reasons why you are quitting. Keep this list in places where you will see it often.  Tell your family, friends, and co-workers that you are quitting. Their support is important.  Talk with your doctor about the choices that may help you quit.  Find out if your health insurance will pay for these treatments.  Know the people, places, things, and activities that make you want to smoke (triggers). Avoid them. What first steps can I take to quit smoking?  Throw away all cigarettes at home, at work, and in your car.  Throw away the things that you use when you smoke, such as ashtrays and lighters.  Clean your car. Make sure to empty the ashtray.  Clean your home, including curtains and carpets. What can I do  to help me quit smoking? Talk with your doctor about taking medicines and seeing a counselor at the same time. You are more likely to succeed when you do both.  If you are pregnant or breastfeeding, talk with your doctor about counseling or other ways to quit smoking. Do not take medicine to help you quit smoking unless your doctor tells you to do so. To quit smoking: Quit right away  Quit smoking totally, instead of slowly cutting  back on how much you smoke over a period of time.  Go to counseling. You are more likely to quit if you go to counseling sessions regularly. Take medicine You may take medicines to help you quit. Some medicines need a prescription, and some you can buy over-the-counter. Some medicines may contain a drug called nicotine to replace the nicotine in cigarettes. Medicines may:  Help you to stop having the desire to smoke (cravings).  Help to stop the problems that come when you stop smoking (withdrawal symptoms). Your doctor may ask you to use:  Nicotine patches, gum, or lozenges.  Nicotine inhalers or sprays.  Non-nicotine medicine that is taken by mouth. Find resources Find resources and other ways to help you quit smoking and remain smoke-free after you quit. These resources are most helpful when you use them often. They include:  Online chats with a Social worker.  Phone quitlines.  Printed Furniture conservator/restorer.  Support groups or group counseling.  Text messaging programs.  Mobile phone apps. Use apps on your mobile phone or tablet that can help you stick to your quit plan. There are many free apps for mobile phones and tablets as well as websites. Examples include Quit Guide from the [employer] and smokefree.gov   What things can I do to make it easier to quit?  Talk to your family and friends. Ask them to support and encourage you.  Call a phone quitline (1-800-QUIT-NOW), reach out to support groups, or work with a Social worker.  Ask people who smoke to not smoke around you.  Avoid places that make you want to smoke, such as: ? Bars. ? Parties. ? Smoke-break areas at work.  Spend time with people who do not smoke.  Lower the stress in your life. Stress can make you want to smoke. Try these things to help your stress: ? Getting regular exercise. ? Doing deep-breathing exercises. ? Doing yoga. ? Meditating. ? Doing a body scan. To do this, close your eyes, focus on one area of your  body at a time from head to toe. Notice which parts of your body are tense. Try to relax the muscles in those areas.   How will I feel when I quit smoking? Day 1 to 3 weeks Within the first 24 hours, you may start to have some problems that come from quitting tobacco. These problems are very bad 2-3 days after you quit, but they do not often last for more than 2-3 weeks. You may get these symptoms:  Mood swings.  Feeling restless, nervous, angry, or annoyed.  Trouble concentrating.  Dizziness.  Strong desire for high-sugar foods and nicotine.  Weight gain.  Trouble pooping (constipation).  Feeling like you may vomit (nausea).  Coughing or a sore throat.  Changes in how the medicines that you take for other issues work in your body.  Depression.  Trouble sleeping (insomnia). Week 3 and afterward After the first 2-3 weeks of quitting, you may start to notice more positive results, such as:  Better sense of  smell and taste.  Less coughing and sore throat.  Slower heart rate.  Lower blood pressure.  Clearer skin.  Better breathing.  Fewer sick days. Quitting smoking can be hard. Do not give up if you fail the first time. Some people need to try a few times before they succeed. Do your best to stick to your quit plan, and talk with your doctor if you have any questions or concerns. Summary  Smoking tobacco is the leading cause of preventable death. Quitting smoking can be hard, but it is one of the best things that you can do for your health.  When you decide to quit smoking, make a plan to help you succeed.  Quit smoking right away, not slowly over a period of time.  When you start quitting, seek help from your doctor, family, or friends. This information is not intended to replace advice given to you by your health care provider. Make sure you discuss any questions you have with your health care provider. Document Revised: 07/26/2019 Document Reviewed:  01/19/2019 Elsevier Patient Education  [place].

## 2021-03-12 NOTE — Progress Notes (Signed)
Name: Henry Fuller   MRN: [id number]    DOB: 04/25/1976   Date:03/12/2021       Progress Note  Chief Complaint  Patient presents with  . Follow-up  . Hypertension  . Hyperlipidemia  . Diabetes     Subjective:   Henry Fuller is a 45 y.o. male, presents to clinic for routine f/up   IDDM uncontrolled - per endocrinology- has not been for over a year Care everywhere labs and notes reviewed Labs done last 08/2020  He is prescribed long acting insulin 50 units daily but scared to take this - doesn't want to by hypoglycemic He also take mealtime insulin, was instructed to take 12 units with meals, he sometimes does this quick acting insulin, but usually after eating.  Sugars range 80-500.  He has meter on his arm but not connected to his device to see sugar readings.   Last aug to Oct pt had severe BL LE pain - ended up having atherosclerotic occlusive disease B/L  - RLE with angioplasty to right popliteal artery  Recommended Vascular f/up in 3 months - no appt done  Last vascular appt and imaging 09/09/2020 ABI's Rt= 1.00 and Lt= 1.08 (previous ABI's Rt= 0.89 and Lt= 1.13) Duplex US of the right lower extremity reveals biphasic waveforms with monophasic/triphasic waveforms of the left lower extremity.  The patient has slightly dampened toe waveforms bilaterally  He did return for f/up 3 month ABI's but no appt, he has not taken plavix - only filled Rx once - didn't think he needed anymore  Complicated HX with b/l LE pain with also neurogenic claudication type sx, recommended MRI/spine specialists f/up  He stopped lyrica med and has tried CBD oil which is helping with pain He has also seen a spine/pain specialists who diagnosed with him irreversible neuropathy - currently his pain is well controlled with CBD oil only   Low T- managed by urology  389's HCT 46.3 - shannon McGowan   E-visit from 4/23 (few days ago reviewed) for severe leg/foot  pain  Hyperlipidemia: Currently treated with lipitor 40 mg, pt reports good med compliance Last Lipids: Lab Results  Component Value Date   CHOL 122 04/16/2020   HDL 24 (L) 04/16/2020   LDLCALC 64 04/16/2020   TRIG 286 (H) 04/16/2020   CHOLHDL 5.1 (H) 04/16/2020   - Denies: Chest pain, shortness of breath, myalgias, claudication  Hypertension:  Currently managed on benazepril-HCTZ 20-12.5 good compliance Blood pressure today is well controlled. BP Readings from Last 3 Encounters:  03/12/21 120/76  03/04/21 134/87  02/17/21 103/69   Pt denies CP, SOB, exertional sx, LE edema, palpitation, Ha's, visual disturbances, lightheadedness, hypotension, syncope.  HR fast today with initial VS- Pulse Readings from Last 3 Encounters:  03/12/21 (!) 107  03/04/21 (!) 102  02/17/21 (!) 108  Hx of similar Repeated in exam room at end of visit, HR manually 100 Last cigarette less than 30 min before appt today  Hx of MDD: Depression screen [facility] 2/9 03/12/2021 09/11/2020 05/05/2020  Decreased Interest 0 3 0  Down, Depressed, Hopeless 0 0 0  PHQ - 2 Score 0 3 0  Altered sleeping 0 2 0  Tired, decreased energy 0 3 0  Change in appetite 0 1 0  Feeling bad or failure about yourself  0 0 0  Trouble concentrating 0 1 0  Moving slowly or fidgety/restless 0 3 0  Suicidal thoughts 0 0 0  PHQ-9 Score 0 13 0  Difficult doing work/chores Not difficult at all Somewhat difficult Not difficult at all  Some recent data might be hidden  stopped cymbalta and lyrica, not on any meds for mood/depression, PHQ reviewed and neg today      Current Outpatient Medications:  .  aspirin EC 81 MG tablet, Take 81 mg by mouth daily., Disp: , Rfl:  .  atorvastatin (LIPITOR) 40 MG tablet, Take 1 tablet (40 mg total) by mouth daily., Disp: 90 tablet, Rfl: 3 .  B-D ULTRAFINE III SHORT PEN 31G X 8 MM MISC, , Disp: , Rfl:  .  benazepril-hydrochlorthiazide (LOTENSIN HCT) 20-12.5 MG tablet, Take 1 tablet by mouth  daily., Disp: 90 tablet, Rfl: 3 .  clopidogrel (PLAVIX) 75 MG tablet, Take 1 tablet (75 mg total) by mouth daily., Disp: 30 tablet, Rfl: 11 .  Continuous Blood Gluc Sensor (FREESTYLE LIBRE 14 DAY SENSOR) MISC, Use 1 kit every 14 (fourteen) days, Disp: , Rfl:  .  insulin aspart (NOVOLOG) 100 UNIT/ML FlexPen, Inject 12 Units as directed 3 (three) times daily before meals., Disp: , Rfl:  .  NEEDLE, DISP, 18 G (BD DISP NEEDLES) 18G X 1-1/2" MISC, Use this needle to draw up medication as directed., Disp: 25 each, Rfl: 0 .  sildenafil (VIAGRA) 100 MG tablet, Take 1 tablet (100 mg total) by mouth daily as needed for erectile dysfunction (take 0.5-1 tablet 1 hour prior to intercourse)., Disp: 10 tablet, Rfl: 3 .  SYRINGE-NEEDLE, DISP, 3 ML (SAFETY SYRINGE/NEEDLE) 23G X 1" 3 ML MISC, Use this needle to inject medication as directed., Disp: 25 each, Rfl: 0 .  testosterone cypionate (DEPOTESTOSTERONE CYPIONATE) 200 MG/ML injection, Inject 1 mL (200 mg total) into the muscle every 14 (fourteen) days., Disp: 4 mL, Rfl: 0 .  TOUJEO SOLOSTAR 300 UNIT/ML Solostar Pen, Inject 50 Units into the skin daily., Disp: , Rfl:  .  pregabalin (LYRICA) 25 MG capsule, Take 1 capsule by mouth twice daily (Patient not taking: Reported on 03/12/2021), Disp: 180 capsule, Rfl: 0 .  Testosterone Enanthate (XYOSTED) 50 MG/0.5ML SOAJ, Inject 50 mg into the skin every 7 (seven) days. (Patient not taking: Reported on 03/12/2021), Disp: 1.96 mL, Rfl: 3  Patient Active Problem List   Diagnosis Date Noted  . Sacrococcygeal disorders, not elsewhere classified 03/12/2021  . Myofascial pain 03/12/2021  . Encounter for monitoring antiplatelet therapy 03/12/2021  . Atherosclerosis of artery of extremity with rest pain ([place]) 03/12/2021  . Leg pain 07/02/2020  . PAD (peripheral artery disease) ([place]) 07/02/2020  . Mixed hyperlipidemia due to type 2 diabetes mellitus ([place]) 10/29/2019  . Hypogonadism in male 10/25/2019  . Erectile dysfunction  10/25/2019  . Primary osteoarthritis involving multiple joints 07/25/2019  . Cervical spondylosis 07/10/2019  . CRP elevated 07/10/2019  . Insomnia 07/10/2019  . Central stenosis of spinal canal 12/12/2018  . Foraminal stenosis of cervical region 12/12/2018  . Neck pain 12/12/2018  . Facet hypertrophy of cervical region 12/12/2018  . Loss of memory 12/12/2018  . Allergic rhinitis 05/02/2018  . Low back pain 12/15/2016  . Sciatica 12/15/2016  . Gastric polyps   . Other diseases of stomach and duodenum   . 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 Atherosclerosis 08/28/2015  . Uncontrolled type 2 diabetes mellitus with hyperglycemia, with long-term current use of insulin ([place]) 08/20/2015  . Tobacco abuse 08/20/2015  . Essential hypertension, benign 08/20/2015  . Abdominal pain, chronic, epigastric 08/17/2015    Past Surgical History:  Procedure Laterality Date  . COLONOSCOPY WITH PROPOFOL  N/A 09/17/2015   Procedure: COLONOSCOPY WITH PROPOFOL;  Surgeon: [name], MD;  Location: [facility];  Service: Endoscopy;  Laterality: N/A;  DIABETIC  . ESOPHAGOGASTRODUODENOSCOPY (EGD) WITH PROPOFOL N/A 09/17/2015   Procedure: ESOPHAGOGASTRODUODENOSCOPY (EGD) WITH PROPOFOL;  Surgeon: [name], MD;  Location: [facility];  Service: Endoscopy;  Laterality: N/A;  . ESOPHAGOGASTRODUODENOSCOPY (EGD) WITH PROPOFOL N/A 10/05/2015   Procedure: ESOPHAGOGASTRODUODENOSCOPY (EGD) WITH PROPOFOL;  Surgeon: [name], MD;  Location: [facility];  Service: Endoscopy;  Laterality: N/A;  . EUS N/A 10/15/2015   Procedure: UPPER ENDOSCOPIC ULTRASOUND (EUS) LINEAR;  Surgeon: [name], MD;  Location: [facility];  Service: Gastroenterology;  Laterality: N/A;  . HERNIA REPAIR  [id number]   umbilical  . LOWER EXTREMITY ANGIOGRAPHY Right 08/18/2020   Procedure: LOWER EXTREMITY ANGIOGRAPHY;  Surgeon: Katha Cabal, MD;  Location: [facility];  Service: Cardiovascular;  Laterality:  Right;  Marland Kitchen VASECTOMY  2001    Family History  Problem Relation Age of Onset  . Diabetes Mother   . Hypertension Mother   . Cancer Father        colon cancer  . Diabetes Brother   . Hypertension Brother   . Diabetes Maternal Grandmother   . Hypertension Maternal Grandmother   . Heart disease Maternal Grandmother   . Liver disease Maternal Grandfather   . Cancer Paternal Grandmother   . Stroke Paternal Grandfather   . Diabetes Brother   . Hypertension Brother     Social History   Tobacco Use  . Smoking status: Current Every Day Smoker    Packs/day: 1.00    Years: 10.00    Pack years: 10.00    Types: Cigarettes  . Smokeless tobacco: Never Used  Vaping Use  . Vaping Use: Never used  Substance Use Topics  . Alcohol use: Yes    Comment: socially   . Drug use: No     Allergies  Allergen Reactions  . Atenolol Other (See Comments)    Chest pressure  . Ethanol     Other reaction(s): Unknown    Health Maintenance  Topic Date Due  . FOOT EXAM  11/23/2019  . COVID-19 Vaccine (3 - Booster for Pfizer series) 08/13/2020  . COLONOSCOPY (Pts 45-60yr Insurance coverage will need to be confirmed)  09/16/2020  . OPHTHALMOLOGY EXAM  01/28/2021  . TETANUS/TDAP  06/07/2021  . INFLUENZA VACCINE  06/14/2021  . HEMOGLOBIN A1C  08/20/2021  . PNEUMOCOCCAL POLYSACCHARIDE VACCINE AGE 49-64 HIGH RISK  Completed  . Hepatitis C Screening  Completed  . HIV Screening  Completed  . HPV VACCINES  Aged Out    Chart Review Today: I personally reviewed active problem list, medication list, allergies, family history, social history, health maintenance, notes from last encounter, lab results, imaging with the patient/caregiver today. See specific times below   Review of Systems  Constitutional: Negative.   HENT: Negative.   Eyes: Negative.   Respiratory: Negative.   Cardiovascular: Negative.   Gastrointestinal: Negative.   Endocrine: Negative.   Genitourinary: Negative.    Musculoskeletal: Negative.   Skin: Negative.   Allergic/Immunologic: Negative.   Neurological: Negative.   Hematological: Negative.   Psychiatric/Behavioral: Negative.   All other systems reviewed and are negative.    Objective:   Vitals:   03/12/21 1523  BP: 120/76  Pulse: (!) 107  Resp: 16  Temp: 98.1 F (36.7 C)  SpO2: 98%  Weight: 187 lb 9.6 oz (85.1 kg)  Height: _0  (1.727  m)    Body mass index is 28.52 kg/m.  Physical Exam Vitals and nursing note reviewed.  Constitutional:      General: He is not in acute distress.    Appearance: Normal appearance. He is well-developed. He is not ill-appearing, toxic-appearing or diaphoretic.     Interventions: Face mask in place.  HENT:     Head: Normocephalic and atraumatic.     Jaw: No trismus.     Right Ear: External ear normal.     Left Ear: External ear normal.  Eyes:     General: Lids are normal. No scleral icterus.       Right eye: No discharge.        Left eye: No discharge.     Conjunctiva/sclera: Conjunctivae normal.  Neck:     Trachea: Trachea and phonation normal. No tracheal deviation.  Cardiovascular:     Rate and Rhythm: Normal rate and regular rhythm.     Pulses: Normal pulses.          Radial pulses are 2+ on the right side and 2+ on the left side.       Posterior tibial pulses are 2+ on the right side and 2+ on the left side.     Heart sounds: Normal heart sounds. No murmur heard. No friction rub. No gallop.   Pulmonary:     Effort: Pulmonary effort is normal. No respiratory distress.     Breath sounds: Normal breath sounds. No stridor. No wheezing, rhonchi or rales.  Abdominal:     General: Bowel sounds are normal. There is no distension.     Palpations: Abdomen is soft.  Musculoskeletal:     Right lower leg: No edema.     Left lower leg: No edema.  Skin:    General: Skin is warm and dry.     Coloration: Skin is not jaundiced.     Findings: No rash.     Nails: There is no clubbing.   Neurological:     Mental Status: He is alert. Mental status is at baseline.     Cranial Nerves: No dysarthria or facial asymmetry.     Motor: No tremor or abnormal muscle tone.     Gait: Gait normal.  Psychiatric:        Mood and Affect: Mood normal.        Speech: Speech normal.        Behavior: Behavior normal. Behavior is cooperative.      Diabetic Foot Exam - Simple   Simple Foot Form Diabetic Foot exam was performed with the following findings: Yes 03/12/2021  4:00 PM  Visual Inspection No deformities, no ulcerations, no other skin breakdown bilaterally: Yes Sensation Testing See comments: Yes Pulse Check See comments: Yes Comments Sensation grossly intact to palpation b/l Diminished pulses b/l PT/DP, good capillary refill       Assessment & Plan:     ICD-10-CM   1. Uncontrolled type 2 diabetes mellitus with hyperglycemia, with long-term current use of insulin (HCC)  E11.65 Lipid panel   Z79.4 Ambulatory referral to Ophthalmology    COMPLETE METABOLIC PANEL WITH GFR    Hemoglobin A1c    Ambulatory referral to Endocrinology   IDDM poor compliance, uncontrolled, per [facility] - not doing long acting insulin - scared - needs education, poor mealtime insulin compliance    2. Essential hypertension, benign  G40 COMPLETE METABOLIC PANEL WITH GFR    benazepril-hydrochlorthiazide (LOTENSIN HCT) 20-12.5 MG tablet   stable,  well controlled, BP at goal today    3. Mixed hyperlipidemia due to type 2 diabetes mellitus (HCC)  E11.69 Lipid panel   W09.8 COMPLETE METABOLIC PANEL WITH GFR    atorvastatin (LIPITOR) 40 MG tablet   due for lipid panel, he notes good statin compliance daily w/o any current SE or concerns    4. PAD (peripheral artery disease) (HCC)  I73.9 Lipid panel    COMPLETE METABOLIC PANEL WITH GFR    clopidogrel (PLAVIX) 75 MG tablet   encouraged him to continue antiplatelet tx and statin, cut back on and quit smoking if able, and encouraged better  IDDM management    5. Atherosclerosis  I70.90 Lipid panel    COMPLETE METABOLIC PANEL WITH GFR    atorvastatin (LIPITOR) 40 MG tablet    clopidogrel (PLAVIX) 75 MG tablet   same as #4    6. Foraminal stenosis of cervical region  M48.02    per spine specialists - pain is better controlled with CBD oil and he stopped lyrica - med list updated Per [facility]    7. Tobacco abuse  Z72.0    advised of dangers of continued smoking, advised to quit, offered resources     8. Pain of foot, unspecified laterality  M79.673 CBC with Differential/Platelet   better today, says the veins to leg were inflammed and painful after testosterone injection, overall appearance of feet and perfusion much better     9. Atherosclerosis of artery of extremity with rest pain (HCC)  I70.229 Lipid panel    COMPLETE METABOLIC PANEL WITH GFR    Hemoglobin A1c    clopidogrel (PLAVIX) 75 MG tablet   last ABI's reviewed, no change from OCt after angioplasty and angiogram with vascular     10. Encounter for medication monitoring  Z51.81 Lipid panel    CBC with Differential/Platelet    COMPLETE METABOLIC PANEL WITH GFR    Hemoglobin A1c    11. Encounter for monitoring antiplatelet therapy  Z51.81 clopidogrel (PLAVIX) 75 MG tablet   Z79.02     12. Hypogonadism in male  E29.1    managed by urology    44. Screening for colon cancer  Z12.11 Ambulatory referral to Gastroenterology    CANCELED: Ambulatory referral to Gastroenterology   over due for 5 year f/up with Dr. Allen Norris    14. Major depressive disorder in remission, unspecified whether recurrent ([place])  F32.5    phq reviewed and neg, better, not on meds    15. Patient noncompliance  Z91.19    educated pt about need to work with endo on feeling more comfortable with meds and risks of having uncontrolled DM Explained how different insulins work, how to titrate meds Educated pt on mechanism for plavix Pt given f/up info for all  his specialists    More than 40 min spent with the patient today in the exam room.  More than 15 minutes was spent today with chart prep and chart review of several specialits notes, ABI tests, post-op and op notes, recent E-visit, reviewed labs, and additional records through care everywhere More than 15 min spent on charting to complete documentation and AVS instructions and info for the pt, since he has poor compliance and its unclear if he doesn't understand, or is scared or is just choosing to ignore medical advise?  Medically complex, non-compliant, at high risk for events, infections, etc due to uncontrolled IDDM, smoking, hx of PAD, HTN, HLD not compliant with meds - plavix/insulin -  last time I saw him he had severe occlusive PAD and was at risk for loss of life/limb - fortunately was able to get in with vascular and get angioplasty to popliteal artery.  Advised pt to f/up with vascular - resume plavix until told not to by specialists (likely to be long term with dx and hx) f/up with endo for DM control/insulin adjustment and education  Return in about 6 months (around 09/11/2021) for Routine follow-up.   Delsa Grana, PA-C 03/12/21 5:07 PM

## 2021-03-13 LAB — COMPLETE METABOLIC PANEL WITH GFR
AG Ratio: 1.6 (calc) (ref 1.0–2.5)
ALT: 23 U/L (ref 9–46)
AST: 16 U/L (ref 10–40)
Albumin: 4.1 g/dL (ref 3.6–5.1)
Alkaline phosphatase (APISO): 80 U/L (ref 36–130)
BUN: 12 mg/dL (ref 7–25)
CO2: 28 mmol/L (ref 20–32)
Calcium: 9.4 mg/dL (ref 8.6–10.3)
Chloride: 99 mmol/L (ref 98–110)
Creat: 1.01 mg/dL (ref 0.60–1.35)
GFR, Est African American: 103 mL/min/{1.73_m2} (ref >=60)
GFR, Est Non African American: 89 mL/min/{1.73_m2} (ref >=60)
Globulin: 2.5 g/dL (calc) (ref 1.9–3.7)
Glucose, Bld: 342 mg/dL — ABNORMAL HIGH (ref 65–99)
Potassium: 4.1 mmol/L (ref 3.5–5.3)
Sodium: 136 mmol/L (ref 135–146)
Total Bilirubin: 0.7 mg/dL (ref 0.2–1.2)
Total Protein: 6.6 g/dL (ref 6.1–8.1)

## 2021-03-13 LAB — CBC WITH DIFFERENTIAL/PLATELET
Absolute Monocytes: 824 cells/uL (ref 200–950)
Basophils Absolute: 62 cells/uL (ref 0–200)
Basophils Relative: 0.5 %
Eosinophils Absolute: 234 cells/uL (ref 15–500)
Eosinophils Relative: 1.9 %
HCT: 45.9 % (ref 38.5–50.0)
Hemoglobin: 15.1 g/dL (ref 13.2–17.1)
Lymphs Abs: 4330 cells/uL — ABNORMAL HIGH (ref 850–3900)
MCH: 30.1 pg (ref 27.0–33.0)
MCHC: 32.9 g/dL (ref 32.0–36.0)
MCV: 91.4 fL (ref 80.0–100.0)
MPV: 10.2 fL (ref 7.5–12.5)
Monocytes Relative: 6.7 %
Neutro Abs: 6851 cells/uL (ref 1500–7800)
Neutrophils Relative %: 55.7 %
Platelets: 324 10*3/uL (ref 140–400)
RBC: 5.02 10*6/uL (ref 4.20–5.80)
RDW: 13 % (ref 11.0–15.0)
Total Lymphocyte: 35.2 %
WBC: 12.3 10*3/uL — ABNORMAL HIGH (ref 3.8–10.8)

## 2021-03-13 LAB — LIPID PANEL
Cholesterol: 91 mg/dL (ref <200)
HDL: 23 mg/dL — ABNORMAL LOW (ref >=40)
LDL Cholesterol (Calc): 42 mg/dL (calc)
Non-HDL Cholesterol (Calc): 68 mg/dL (calc) (ref <130)
Total CHOL/HDL Ratio: 4 (calc) (ref <5.0)
Triglycerides: 183 mg/dL — ABNORMAL HIGH (ref <150)

## 2021-03-13 LAB — HEMOGLOBIN A1C
Hgb A1c MFr Bld: 11.1 % of total Hgb — ABNORMAL HIGH (ref <5.7)
Mean Plasma Glucose: 272 mg/dL
eAG (mmol/L): 15.1 mmol/L

## 2021-03-18 ENCOUNTER — Telehealth: Payer: Self-pay | Admitting: *Deleted

## 2021-03-18 NOTE — Telephone Encounter (Signed)
.  left message to have patient return my call.  Did prior auth for XYOSTED and this was denied, per the clinical review team. Pt must have documentation of therapeutic failure, intolerance or contraindication to generic topical testosterone must be provided  For the requested medication to be considered for approval.  Does pt want to try the testosterone gel?

## 2021-03-22 ENCOUNTER — Ambulatory Visit (INDEPENDENT_AMBULATORY_CARE_PROVIDER_SITE_OTHER): Payer: BC Managed Care – PPO | Admitting: Urology

## 2021-03-22 ENCOUNTER — Other Ambulatory Visit: Payer: Self-pay

## 2021-03-22 DIAGNOSIS — E349 Endocrine disorder, unspecified: Secondary | ICD-10-CM | POA: Diagnosis not present

## 2021-03-22 MED ORDER — TESTOSTERONE 50 MG/5GM (1%) TD GEL: 5.0000 g | Freq: Every day | TRANSDERMAL | 0 refills | Status: DC

## 2021-03-22 NOTE — Progress Notes (Signed)
03/22/2021 4:26 PM   [place] Jun 06, 1976 [id number]  Referring provider: Delsa Grana, PA-C [address] [place],   [address]  Chief Complaint  Patient presents with  . Advice Only   Urological history: 1. Testosterone deficiency -failed Clomid  -received 200 cc of testosterone cypionate on 03/04/2021  2. ED -contributing factors of age, smoking, DM, HLD, HTN and depression -managed with sildenafil 100 mg, on-demand-dosing   HPI: Henry Fuller is a 45 y.o. male who presents with his wife, Henry Fuller, for a testosterone injection.     He wanted to have a trial of Xyosted as he was comfortable giving subcu injections with his diabetic medications, but he was not comfortable giving IM injections.  He did receive an IM injection 2 weeks ago of the testosterone cypionate and states that he felt a difference right away with more energy.  When he was prescribed the [facility], his insurance denied the medication stating he needed to try the testosterone gels.  PMH: Past Medical History:  Diagnosis Date  . Concussion   . Diabetes mellitus without complication ([place])    type 2  . Diabetic retinopathy associated with controlled type 2 diabetes mellitus ([place]) 05/25/2018   Dr. Ellin Mayhew, Sept 2018  . Erectile dysfunction   . GERD (gastroesophageal reflux disease)   . Hyperlipidemia   . Hypertension    controlled on meds  . Hypogonadism in male   . Loose tooth due to trauma    front  . Post concussion syndrome 12/12/2018  . Vitamin D deficiency     Surgical History: Past Surgical History:  Procedure Laterality Date  . COLONOSCOPY WITH PROPOFOL N/A 09/17/2015   Procedure: COLONOSCOPY WITH PROPOFOL;  Surgeon: [name], MD;  Location: [place];  Service: Endoscopy;  Laterality: N/A;  DIABETIC  . ESOPHAGOGASTRODUODENOSCOPY (EGD) WITH PROPOFOL N/A 09/17/2015   Procedure: ESOPHAGOGASTRODUODENOSCOPY (EGD) WITH PROPOFOL;  Surgeon: [name], MD;  Location: [place];  Service: Endoscopy;  Laterality: N/A;  . ESOPHAGOGASTRODUODENOSCOPY (EGD) WITH PROPOFOL N/A 10/05/2015   Procedure: ESOPHAGOGASTRODUODENOSCOPY (EGD) WITH PROPOFOL;  Surgeon: [name], MD;  Location: [place];  Service: Endoscopy;  Laterality: N/A;  . EUS N/A 10/15/2015   Procedure: UPPER ENDOSCOPIC ULTRASOUND (EUS) LINEAR;  Surgeon: [name], MD;  Location: [facility];  Service: Gastroenterology;  Laterality: N/A;  . HERNIA REPAIR  [id number]   umbilical  . LOWER EXTREMITY ANGIOGRAPHY Right 08/18/2020   Procedure: LOWER EXTREMITY ANGIOGRAPHY;  Surgeon: Katha Cabal, MD;  Location: [facility];  Service: Cardiovascular;  Laterality: Right;  Marland Kitchen VASECTOMY  2001    Home Medications:  Allergies as of 03/22/2021      Reactions   Atenolol Other (See Comments)   Chest pressure   Ethanol    Other reaction(s): Unknown      Medication List       Accurate as of Mar 22, 2021  4:26 PM. If you have any questions, ask your nurse or doctor.        STOP taking these medications   testosterone cypionate 200 MG/ML injection Commonly known as: DEPOTESTOSTERONE CYPIONATE Stopped by: Zara Council, PA-C   Xyosted 50 MG/0.5ML Soaj Generic drug: Testosterone Enanthate Stopped by: Zara Council, PA-C     TAKE these medications   aspirin EC 81 MG tablet Take 81 mg by mouth daily.   atorvastatin 40 MG tablet Commonly known as: LIPITOR Take 1 tablet (40 mg total) by mouth daily.  B-D ULTRAFINE III SHORT PEN 31G X 8 MM Misc Generic drug: Insulin Pen Needle   BD Disp Needles 18G X 1-1/2" Misc Generic drug: NEEDLE (DISP) 18 G Use this needle to draw up medication as directed.   benazepril-hydrochlorthiazide 20-12.5 MG tablet Commonly known as: LOTENSIN HCT Take 1 tablet by mouth daily.   clopidogrel 75 MG tablet Commonly known as: Plavix Take 1 tablet (75 mg total) by mouth daily.   FreeStyle Libre 14 Day  Sensor Misc Use 1 kit every 14 (fourteen) days   insulin aspart 100 UNIT/ML FlexPen Commonly known as: NOVOLOG Inject 12 Units as directed 3 (three) times daily before meals.   Safety Syringe/Needle 23G X 1" 3 ML Misc Generic drug: SYRINGE-NEEDLE (DISP) 3 ML Use this needle to inject medication as directed.   sildenafil 100 MG tablet Commonly known as: VIAGRA Take 1 tablet (100 mg total) by mouth daily as needed for erectile dysfunction (take 0.5-1 tablet 1 hour prior to intercourse).   testosterone 50 MG/5GM (1%) Gel Commonly known as: ANDROGEL Place 5 g onto the skin daily. Started by: Zara Council, PA-C   Toujeo SoloStar 300 UNIT/ML Solostar Pen Generic drug: insulin glargine (1 Unit Dial) Inject 50 Units into the skin daily.       Allergies:  Allergies  Allergen Reactions  . Atenolol Other (See Comments)    Chest pressure  . Ethanol     Other reaction(s): Unknown    Family History: Family History  Problem Relation Age of Onset  . Diabetes Mother   . Hypertension Mother   . Cancer Father        colon cancer  . Diabetes Brother   . Hypertension Brother   . Diabetes Maternal Grandmother   . Hypertension Maternal Grandmother   . Heart disease Maternal Grandmother   . Liver disease Maternal Grandfather   . Cancer Paternal Grandmother   . Stroke Paternal Grandfather   . Diabetes Brother   . Hypertension Brother     Social History:  reports that he has been smoking cigarettes. He has a 10.00 pack-year smoking history. He has never used smokeless tobacco. He reports current alcohol use. He reports that he does not use drugs.  ROS: Pertinent ROS in HPI  Physical Exam: Constitutional:  Well nourished. Alert and oriented, No acute distress. HEENT: [place] AT, mask in place.  Trachea midline Cardiovascular: No clubbing, cyanosis, or edema. Respiratory: Normal respiratory effort, no increased work of breathing. Neurologic: Grossly intact, no focal deficits,  moving all 4 extremities. Psychiatric: Normal mood and affect.  Laboratory Data: Lab Results  Component Value Date   WBC 12.3 (H) 03/12/2021   HGB 15.1 03/12/2021   HCT 45.9 03/12/2021   MCV 91.4 03/12/2021   PLT 324 03/12/2021    Lab Results  Component Value Date   CREATININE 1.01 03/12/2021    Lab Results  Component Value Date   TESTOSTERONE 389 02/17/2021    Lab Results  Component Value Date   HGBA1C 11.1 (H) 03/12/2021    Lab Results  Component Value Date   TSH 0.42 08/29/2019       Component Value Date/Time   CHOL 91 03/12/2021 1603   HDL 23 (L) 03/12/2021 1603   CHOLHDL 4.0 03/12/2021 1603   VLDL 25 12/15/2016 0902   LDLCALC 42 03/12/2021 1603    Lab Results  Component Value Date   AST 16 03/12/2021   Lab Results  Component Value Date   ALT 23 03/12/2021  I  have reviewed the labs.   Pertinent Imaging: N/A  Assessment & Plan:    1. Testosterone cypionate -Explained to the patient that his insurance required him to have a trial of testosterone gel prior to coverage of other treatment modalities.  He states that he tried the gels several years ago and felt they were not effective, but on unable to get the records concerning this to send to the insurance company.  At this time, he could continue to pay out-of-pocket for the testosterone cypionate or have a trial of the topical testosterone and if it was ineffective resubmit a prescription for the [facility]. -He would like a trial of the AndroGel 1%, 5 g packet applied daily -I have sent a prescription in for this medication to his pharmacy   Return in about 1 month (around 04/22/2021) for Testosterone only.  These notes generated with voice recognition software. I apologize for typographical errors.  Zara Council, PA-C  [facility] [address]  [place], [place] [address] [phone]  I spent 15 minutes on the day of the encounter to include pre-visit  record review, face-to-face time with the patient, and post-visit ordering of tests.

## 2021-03-25 NOTE — Telephone Encounter (Signed)
Pt was seen 03/22/2021 and gave testosterone gel.

## 2021-03-26 ENCOUNTER — Encounter: Payer: Self-pay | Admitting: Urology

## 2021-04-02 ENCOUNTER — Other Ambulatory Visit: Payer: Self-pay | Admitting: Urology

## 2021-04-02 DIAGNOSIS — E349 Endocrine disorder, unspecified: Secondary | ICD-10-CM

## 2021-04-02 MED ORDER — TESTOSTERONE 50 MG/5GM (1%) TD GEL
5.0000 g | Freq: Every day | TRANSDERMAL | 0 refills | Status: DC
Start: 2021-04-02 — End: 2021-08-03

## 2021-04-02 NOTE — Telephone Encounter (Signed)
Larene [place] can you please resend this?

## 2021-04-02 NOTE — Progress Notes (Signed)
Prescription sent for AndroGel to his pharmacy.

## 2021-04-13 ENCOUNTER — Other Ambulatory Visit: Payer: Self-pay

## 2021-04-14 ENCOUNTER — Ambulatory Visit: Payer: Self-pay | Admitting: Urology

## 2021-04-26 ENCOUNTER — Other Ambulatory Visit: Payer: Self-pay

## 2021-04-26 ENCOUNTER — Other Ambulatory Visit: Payer: BC Managed Care – PPO

## 2021-04-26 DIAGNOSIS — E291 Testicular hypofunction: Secondary | ICD-10-CM | POA: Diagnosis not present

## 2021-04-27 LAB — TESTOSTERONE: Testosterone: 532 ng/dL (ref 264–916)

## 2021-04-28 ENCOUNTER — Encounter: Payer: Self-pay | Admitting: *Deleted

## 2021-04-29 ENCOUNTER — Encounter: Payer: Self-pay | Admitting: Podiatry

## 2021-04-29 ENCOUNTER — Other Ambulatory Visit: Payer: Self-pay

## 2021-04-29 ENCOUNTER — Ambulatory Visit (INDEPENDENT_AMBULATORY_CARE_PROVIDER_SITE_OTHER): Payer: BC Managed Care – PPO | Admitting: Podiatry

## 2021-04-29 ENCOUNTER — Telehealth: Payer: Self-pay | Admitting: Urology

## 2021-04-29 DIAGNOSIS — E1165 Type 2 diabetes mellitus with hyperglycemia: Secondary | ICD-10-CM | POA: Diagnosis not present

## 2021-04-29 DIAGNOSIS — Z794 Long term (current) use of insulin: Secondary | ICD-10-CM

## 2021-04-29 DIAGNOSIS — Q666 Other congenital valgus deformities of feet: Secondary | ICD-10-CM | POA: Diagnosis not present

## 2021-04-29 DIAGNOSIS — L97521 Non-pressure chronic ulcer of other part of left foot limited to breakdown of skin: Secondary | ICD-10-CM | POA: Diagnosis not present

## 2021-04-29 NOTE — Telephone Encounter (Signed)
Rx testosterone sent to pharmacy.  Please schedule office visit for injection training.  He will also need a testosterone approximately 6 weeks after starting treatment.

## 2021-05-02 ENCOUNTER — Other Ambulatory Visit: Payer: Self-pay | Admitting: Urology

## 2021-05-03 ENCOUNTER — Other Ambulatory Visit: Payer: Self-pay

## 2021-05-03 ENCOUNTER — Ambulatory Visit (INDEPENDENT_AMBULATORY_CARE_PROVIDER_SITE_OTHER): Payer: BC Managed Care – PPO | Admitting: Podiatry

## 2021-05-03 DIAGNOSIS — Q666 Other congenital valgus deformities of feet: Secondary | ICD-10-CM | POA: Diagnosis not present

## 2021-05-03 NOTE — Progress Notes (Signed)
Patient presents today to be casted for custom molded orthotics. Dr. Posey Pronto has been treating patient for.   Impression foam cast was taken.   Patient info-  Shoe size: 10 regular  Shoe style: Steel toed boots  Height:   Weight: 172 lbs   Patient will be notified once orthotics arrive in office and reappoint for fitting at that time.

## 2021-05-04 ENCOUNTER — Encounter: Payer: Self-pay | Admitting: Podiatry

## 2021-05-04 NOTE — Progress Notes (Signed)
Subjective:  Patient ID: Henry Fuller, male    DOB: [birth year],  MRN: [id number]  Chief Complaint  Patient presents with   Callouses    Patient presents today for painful callus bottom of left 5th met x 3-4 weeks    45 y.o. male presents for wound care.  Patient presents with complaint of left submetatarsal 5 callus/ulceration.  Patient has been present for quite some time about 3 to 4 weeks.  Burns and feels her walking on a rock.  He is tried some calcium omentum which has helped.  He wanted get evaluated.  He is a diabetic with last A1c of 11.  He has not seen anyone else prior to see me.  He denies seeing any foot and ankle specialist.  He has not tried any treatment options for this.  He also has secondary complaint of flatfoot and would like to know if there is any kind of insoles or anything that he could get.  Did   Review of Systems: Negative except as noted in the HPI. Denies N/V/F/Ch.  Past Medical History:  Diagnosis Date   Concussion    Diabetes mellitus without complication ([place])    type 2   Diabetic retinopathy associated with controlled type 2 diabetes mellitus ([place]) 05/25/2018   Dr. Ellin Mayhew, Sept 2018   Erectile dysfunction    GERD (gastroesophageal reflux disease)    Hyperlipidemia    Hypertension    controlled on meds   Hypogonadism in male    Loose tooth due to trauma    front   Post concussion syndrome 12/12/2018   Vitamin D deficiency     Current Outpatient Medications:    atorvastatin (LIPITOR) 40 MG tablet, Take 1 tablet (40 mg total) by mouth daily., Disp: 30 tablet, Rfl: 11   B-D ULTRAFINE III SHORT PEN 31G X 8 MM MISC, , Disp: , Rfl:    benazepril-hydrochlorthiazide (LOTENSIN HCT) 20-12.5 MG tablet, Take 1 tablet by mouth daily., Disp: 30 tablet, Rfl: 11   clopidogrel (PLAVIX) 75 MG tablet, Take 1 tablet (75 mg total) by mouth daily., Disp: 30 tablet, Rfl: 11   Continuous Blood Gluc Sensor (FREESTYLE LIBRE 14 DAY SENSOR) MISC, Use 1 kit every  14 (fourteen) days, Disp: , Rfl:    insulin aspart (NOVOLOG) 100 UNIT/ML FlexPen, Inject 12 Units as directed 3 (three) times daily before meals., Disp: , Rfl:    NEEDLE, DISP, 18 G (BD DISP NEEDLES) 18G X 1-1/2" MISC, Use this needle to draw up medication as directed., Disp: 25 each, Rfl: 0   sildenafil (VIAGRA) 100 MG tablet, Take 1 tablet (100 mg total) by mouth daily as needed for erectile dysfunction (take 0.5-1 tablet 1 hour prior to intercourse)., Disp: 10 tablet, Rfl: 3   SYRINGE-NEEDLE, DISP, 3 ML (SAFETY SYRINGE/NEEDLE) 23G X 1" 3 ML MISC, Use this needle to inject medication as directed., Disp: 25 each, Rfl: 0   testosterone (ANDROGEL) 50 MG/5GM (1%) GEL, Place 5 g onto the skin daily., Disp: 180 g, Rfl: 0   TOUJEO SOLOSTAR 300 UNIT/ML Solostar Pen, Inject 50 Units into the skin daily., Disp: , Rfl:   Social History   Tobacco Use  Smoking Status Every Day   Packs/day: 1.00   Years: 10.00   Pack years: 10.00   Types: Cigarettes  Smokeless Tobacco Never    Allergies  Allergen Reactions   Atenolol Other (See Comments)    Chest pressure   Ethanol     Other reaction(s): Unknown  Objective:  There were no vitals filed for this visit. There is no height or weight on file to calculate BMI. Constitutional Well developed. Well nourished.  Vascular Dorsalis pedis pulses palpable bilaterally. Posterior tibial pulses palpable bilaterally. Capillary refill normal to all digits.  No cyanosis or clubbing noted. Pedal hair growth normal.  Neurologic Normal speech. Oriented to person, place, and time. Protective sensation absent  Dermatologic Wound Location: Left submetatarsal 5 ulceration limited to breakdown of skin.  No purulent drainage and no redness noted.  No deep ulceration noted.  No probe down to bone noted.  No malodor present. Wound Base: Mixed Granular/Fibrotic Peri-wound: Calloused Exudate: Scant/small amount Serous exudate Wound Measurements: -See below   Orthopedic: No pain to palpation either foot.   Radiographs: None Assessment:   1. Pes planovalgus   2. Foot ulcer, limited to breakdown of skin, left ([place])   3. Uncontrolled type 2 diabetes mellitus with hyperglycemia, with long-term current use of insulin ([place])    Plan:  Patient was evaluated and treated and all questions answered.  Ulcer left submetatarsal 5 ulceration limited to the breakdown of the skin -Debridement as below. -Dressed with triple antibiotic and a Band-Aid, DSD. -Continue off-loading with surgical shoe.  Pes planovalgus -I explained the patient the etiology of pes planovalgus and various treatment options were extensively discussed.  Given the foot structure that he has with excessive pressure submetatarsal 5 he will benefit from custom-made orthotics with offloading of bilateral submetatarsal 5.  I discussed this with the patient in extensive detail he states understanding.  This will also help take the pressure off of the ulceration and heel lift.  Procedure: Excisional Debridement of Wound Tool: Sharp chisel blade/tissue nipper Rationale: Removal of non-viable soft tissue from the wound to promote healing.  Anesthesia: none Pre-Debridement Wound Measurements: 0.1 cm x 0.2 cm x 0.1 cm  Post-Debridement Wound Measurements: 0.2 cm x 0.3 cm x 0.1 cm  Type of Debridement: Sharp Excisional Tissue Removed: Non-viable soft tissue Blood loss: Minimal (<50cc) Depth of Debridement: subcutaneous tissue. Technique: Sharp excisional debridement to bleeding, viable wound base.  Wound Progress: It is my initial evaluation I will continue to monitor the progression of it. Site healing conversation 7 Dressing: Dry, sterile, compression dressing. Disposition: Patient tolerated procedure well. Patient to return in 1 week for follow-up.  No follow-ups on file.

## 2021-05-12 DIAGNOSIS — E11621 Type 2 diabetes mellitus with foot ulcer: Secondary | ICD-10-CM | POA: Diagnosis not present

## 2021-05-12 DIAGNOSIS — E1165 Type 2 diabetes mellitus with hyperglycemia: Secondary | ICD-10-CM | POA: Diagnosis not present

## 2021-05-12 DIAGNOSIS — E1142 Type 2 diabetes mellitus with diabetic polyneuropathy: Secondary | ICD-10-CM | POA: Diagnosis not present

## 2021-05-12 DIAGNOSIS — E113293 Type 2 diabetes mellitus with mild nonproliferative diabetic retinopathy without macular edema, bilateral: Secondary | ICD-10-CM | POA: Diagnosis not present

## 2021-05-13 ENCOUNTER — Ambulatory Visit (INDEPENDENT_AMBULATORY_CARE_PROVIDER_SITE_OTHER): Payer: BC Managed Care – PPO | Admitting: Podiatry

## 2021-05-13 ENCOUNTER — Other Ambulatory Visit: Payer: Self-pay

## 2021-05-13 ENCOUNTER — Encounter: Payer: Self-pay | Admitting: Podiatry

## 2021-05-13 DIAGNOSIS — Q666 Other congenital valgus deformities of feet: Secondary | ICD-10-CM

## 2021-05-13 DIAGNOSIS — E1165 Type 2 diabetes mellitus with hyperglycemia: Secondary | ICD-10-CM | POA: Diagnosis not present

## 2021-05-13 DIAGNOSIS — L97521 Non-pressure chronic ulcer of other part of left foot limited to breakdown of skin: Secondary | ICD-10-CM

## 2021-05-13 DIAGNOSIS — Z794 Long term (current) use of insulin: Secondary | ICD-10-CM

## 2021-05-18 ENCOUNTER — Ambulatory Visit (INDEPENDENT_AMBULATORY_CARE_PROVIDER_SITE_OTHER): Payer: BC Managed Care – PPO | Admitting: Unknown Physician Specialty

## 2021-05-18 ENCOUNTER — Encounter: Payer: Self-pay | Admitting: Unknown Physician Specialty

## 2021-05-18 ENCOUNTER — Other Ambulatory Visit: Payer: Self-pay

## 2021-05-18 ENCOUNTER — Encounter: Payer: Self-pay | Admitting: Podiatry

## 2021-05-18 VITALS — BP 126/76 | HR 98 | Temp 98.1°F | Resp 16 | Ht 68.0 in | Wt 192.8 lb

## 2021-05-18 DIAGNOSIS — Z1211 Encounter for screening for malignant neoplasm of colon: Secondary | ICD-10-CM | POA: Diagnosis not present

## 2021-05-18 DIAGNOSIS — Z800 Family history of malignant neoplasm of digestive organs: Secondary | ICD-10-CM

## 2021-05-18 DIAGNOSIS — D72829 Elevated white blood cell count, unspecified: Secondary | ICD-10-CM | POA: Diagnosis not present

## 2021-05-18 DIAGNOSIS — R1110 Vomiting, unspecified: Secondary | ICD-10-CM

## 2021-05-18 LAB — CBC WITH DIFFERENTIAL/PLATELET
Absolute Monocytes: 720 cells/uL (ref 200–950)
Basophils Absolute: 48 cells/uL (ref 0–200)
Basophils Relative: 0.5 %
Eosinophils Absolute: 346 cells/uL (ref 15–500)
Eosinophils Relative: 3.6 %
HCT: 47.5 % (ref 38.5–50.0)
Hemoglobin: 15.9 g/dL (ref 13.2–17.1)
Lymphs Abs: 3062 cells/uL (ref 850–3900)
MCH: 29.9 pg (ref 27.0–33.0)
MCHC: 33.5 g/dL (ref 32.0–36.0)
MCV: 89.3 fL (ref 80.0–100.0)
MPV: 10.2 fL (ref 7.5–12.5)
Monocytes Relative: 7.5 %
Neutro Abs: 5424 cells/uL (ref 1500–7800)
Neutrophils Relative %: 56.5 %
Platelets: 326 10*3/uL (ref 140–400)
RBC: 5.32 10*6/uL (ref 4.20–5.80)
RDW: 12.5 % (ref 11.0–15.0)
Total Lymphocyte: 31.9 %
WBC: 9.6 10*3/uL (ref 3.8–10.8)

## 2021-05-18 NOTE — Progress Notes (Signed)
Subjective:  Patient ID: Henry Fuller, male    DOB: 03-06-1976,  MRN: [id number]  Chief Complaint  Patient presents with   Follow-up    F/u of callus on the left foot.    45 y.o. male presents for wound care.  Patient presents with a follow-up of left submetatarsal 5 ulceration/preulcerative callus.  He is a diabetic and uncontrolled.  He states that he is doing a lot better.  His pain has gone progressively down since the last visit.  He also was casted for orthotics as well.   Review of Systems: Negative except as noted in the HPI. Denies N/V/F/Ch.  Past Medical History:  Diagnosis Date   Concussion    Diabetes mellitus without complication ([place])    type 2   Diabetic retinopathy associated with controlled type 2 diabetes mellitus ([place]) 05/25/2018   Dr. Ellin Mayhew, Sept 2018   Erectile dysfunction    GERD (gastroesophageal reflux disease)    Hyperlipidemia    Hypertension    controlled on meds   Hypogonadism in male    Loose tooth due to trauma    front   Post concussion syndrome 12/12/2018   Vitamin D deficiency     Current Outpatient Medications:    atorvastatin (LIPITOR) 40 MG tablet, Take 1 tablet (40 mg total) by mouth daily., Disp: 30 tablet, Rfl: 11   B-D ULTRAFINE III SHORT PEN 31G X 8 MM MISC, , Disp: , Rfl:    benazepril-hydrochlorthiazide (LOTENSIN HCT) 20-12.5 MG tablet, Take 1 tablet by mouth daily., Disp: 30 tablet, Rfl: 11   clopidogrel (PLAVIX) 75 MG tablet, Take 1 tablet (75 mg total) by mouth daily., Disp: 30 tablet, Rfl: 11   Continuous Blood Gluc Sensor (FREESTYLE LIBRE 14 DAY SENSOR) MISC, Use 1 kit every 14 (fourteen) days, Disp: , Rfl:    insulin aspart (NOVOLOG) 100 UNIT/ML FlexPen, Inject 12 Units as directed 3 (three) times daily before meals., Disp: , Rfl:    NEEDLE, DISP, 18 G (BD DISP NEEDLES) 18G X 1-1/2" MISC, Use this needle to draw up medication as directed., Disp: 25 each, Rfl: 0   sildenafil (VIAGRA) 100 MG tablet, Take 1 tablet (100  mg total) by mouth daily as needed for erectile dysfunction (take 0.5-1 tablet 1 hour prior to intercourse)., Disp: 10 tablet, Rfl: 3   SYRINGE-NEEDLE, DISP, 3 ML (SAFETY SYRINGE/NEEDLE) 23G X 1" 3 ML MISC, Use this needle to inject medication as directed., Disp: 25 each, Rfl: 0   testosterone (ANDROGEL) 50 MG/5GM (1%) GEL, Place 5 g onto the skin daily., Disp: 180 g, Rfl: 0   TOUJEO SOLOSTAR 300 UNIT/ML Solostar Pen, Inject 50 Units into the skin daily., Disp: , Rfl:   Social History   Tobacco Use  Smoking Status Every Day   Packs/day: 1.00   Years: 10.00   Pack years: 10.00   Types: Cigarettes  Smokeless Tobacco Never    Allergies  Allergen Reactions   Atenolol Other (See Comments)    Chest pressure   Ethanol     Other reaction(s): Unknown   Objective:  There were no vitals filed for this visit. There is no height or weight on file to calculate BMI. Constitutional Well developed. Well nourished.  Vascular Dorsalis pedis pulses palpable bilaterally. Posterior tibial pulses palpable bilaterally. Capillary refill normal to all digits.  No cyanosis or clubbing noted. Pedal hair growth normal.  Neurologic Normal speech. Oriented to person, place, and time. Protective sensation absent  Dermatologic Wound Location: Left fifth  submetatarsal 5 ulceration clinically healed.  No further signs of reulceration noted.  No clinical signs of infection noted.  Orthopedic: No pain to palpation either foot.   Radiographs: None Assessment:   1. Uncontrolled type 2 diabetes mellitus with hyperglycemia, with long-term current use of insulin ([place])   2. Pes planovalgus   3. Foot ulcer, limited to breakdown of skin, left [facility])     Plan:  Patient was evaluated and treated and all questions answered.  Ulcer left submetatarsal 5 ulceration limited to the breakdown of the skin -Clinically healed with immobilization.  Given that this has clinically healed and no further signs of recurrent RV  ulceration noted.  I will still like for him to continue using surgical shoe until he can get himself into orthotics with offloading pad.  I discussed this with the patient extensive detail he states understanding.  If any foot and ankle issues arise in future come and see me right away.  He will  Pes planovalgus -I explained the patient the etiology of pes planovalgus and various treatment options were extensively discussed.  Given the foot structure that he has with excessive pressure submetatarsal 5 he will benefit from custom-made orthotics with offloading of bilateral submetatarsal 5.  I discussed this with the patient in extensive detail he states understanding.  This will also help take the pressure off of the ulceration and heel lift.   No follow-ups on file.

## 2021-05-18 NOTE — Progress Notes (Signed)
BP 126/76   Pulse 98   Temp 98.1 F (36.7 C) (Oral)   Resp 16   Ht 5\' 8"  (1.727 m)   Wt 192 lb 12.8 oz (87.5 kg)   SpO2 97%   BMI 29.32 kg/m    Subjective:    Patient ID: Henry Fuller, male    DOB: [birth year], 45 y.o.   MRN: [id number]  HPI: Nakul Avino is a 45 y.o. male  Chief Complaint  Patient presents with   Gastroesophageal Reflux   Emesis    And loose stools after eating certain foods   Pt states that for the past 2 months developed nausea and vomiting.  3 times: Once after eating something from [employer], once after eating [place], and another time after cookout.  No weight loss.  Each episode has been episodic lasting less than 1 dayl  His father did have colon cancer in his 62's  Lab review does show an elevated WBC   Relevant past medical, surgical, family and social history reviewed and updated as indicated. Interim medical history since our last visit reviewed. Allergies and medications reviewed and updated.  Review of Systems  Per HPI unless specifically indicated above     Objective:    BP 126/76   Pulse 98   Temp 98.1 F (36.7 C) (Oral)   Resp 16   Ht 5\' 8"  (1.727 m)   Wt 192 lb 12.8 oz (87.5 kg)   SpO2 97%   BMI 29.32 kg/m   Wt Readings from Last 3 Encounters:  05/18/21 192 lb 12.8 oz (87.5 kg)  03/12/21 187 lb 9.6 oz (85.1 kg)  03/04/21 170 lb (77.1 kg)    Physical Exam Constitutional:      General: He is not in acute distress.    Appearance: Normal appearance. He is well-developed.  HENT:     Head: Normocephalic and atraumatic.  Eyes:     General: Lids are normal. No scleral icterus.       Right eye: No discharge.        Left eye: No discharge.     Conjunctiva/sclera: Conjunctivae normal.  Neck:     Vascular: No carotid bruit or JVD.  Cardiovascular:     Rate and Rhythm: Normal rate and regular rhythm.     Heart sounds: Normal heart sounds.  Pulmonary:     Effort: Pulmonary effort is normal. No  respiratory distress.     Breath sounds: Normal breath sounds.  Abdominal:     General: Abdomen is flat. There is no distension.     Palpations: Abdomen is soft. There is no hepatomegaly or splenomegaly.     Tenderness: There is no abdominal tenderness.  Musculoskeletal:        General: Normal range of motion.     Cervical back: Normal range of motion and neck supple.  Skin:    General: Skin is warm and dry.     Coloration: Skin is not pale.     Findings: No rash.  Neurological:     Mental Status: He is alert and oriented to person, place, and time.  Psychiatric:        Behavior: Behavior normal.        Thought Content: Thought content normal.        Judgment: Judgment normal.    Results for orders placed or performed in visit on 04/26/21  Testosterone  Result Value Ref Range   Testosterone 532 264 - 916 ng/dL  Assessment & Plan:   Problem List Items Addressed This Visit   None Visit Diagnoses     Screening for colon cancer    -  Primary   Relevant Orders   Ambulatory referral to Gastroenterology   Ambulatory referral to Gastroenterology   Family history of colon cancer       Relevant Orders   Ambulatory referral to Gastroenterology   Vomiting, intractability of vomiting not specified, presence of nausea not specified, unspecified vomiting type       Relevant Orders   Ambulatory referral to Gastroenterology   CBC with Differential/Platelet   Leukocytosis, unspecified type       Relevant Orders   CBC with Differential/Platelet       Pt reassured that symptoms, being sporadic and short-lived, do not indicate a chronic condition.  Will refer to GI due to family history.  Check CBC with hx of WBC and recent symptoms.    Follow up plan: Return if symptoms worsen or fail to improve.

## 2021-05-24 ENCOUNTER — Encounter: Payer: Self-pay | Admitting: *Deleted

## 2021-05-26 ENCOUNTER — Encounter: Payer: Self-pay | Admitting: Family Medicine

## 2021-05-26 ENCOUNTER — Other Ambulatory Visit: Payer: Self-pay

## 2021-05-26 ENCOUNTER — Ambulatory Visit (INDEPENDENT_AMBULATORY_CARE_PROVIDER_SITE_OTHER): Payer: BC Managed Care – PPO | Admitting: Family Medicine

## 2021-05-26 VITALS — BP 130/80 | HR 84 | Temp 98.4°F | Resp 18 | Ht 68.0 in | Wt 191.9 lb

## 2021-05-26 DIAGNOSIS — R6884 Jaw pain: Secondary | ICD-10-CM | POA: Diagnosis not present

## 2021-05-26 LAB — CBC WITH DIFFERENTIAL/PLATELET
Absolute Monocytes: 854 cells/uL (ref 200–950)
Basophils Absolute: 49 cells/uL (ref 0–200)
Basophils Relative: 0.5 %
Eosinophils Absolute: 272 cells/uL (ref 15–500)
Eosinophils Relative: 2.8 %
HCT: 46 % (ref 38.5–50.0)
Hemoglobin: 15.6 g/dL (ref 13.2–17.1)
Lymphs Abs: 3017 cells/uL (ref 850–3900)
MCH: 30.1 pg (ref 27.0–33.0)
MCHC: 33.9 g/dL (ref 32.0–36.0)
MCV: 88.6 fL (ref 80.0–100.0)
MPV: 10.2 fL (ref 7.5–12.5)
Monocytes Relative: 8.8 %
Neutro Abs: 5510 cells/uL (ref 1500–7800)
Neutrophils Relative %: 56.8 %
Platelets: 325 10*3/uL (ref 140–400)
RBC: 5.19 10*6/uL (ref 4.20–5.80)
RDW: 12.8 % (ref 11.0–15.0)
Total Lymphocyte: 31.1 %
WBC: 9.7 10*3/uL (ref 3.8–10.8)

## 2021-05-26 MED ORDER — NAPROXEN 500 MG PO TABS: 500.0000 mg | ORAL_TABLET | Freq: Two times a day (BID) | ORAL | 0 refills | Status: AC

## 2021-05-26 NOTE — Progress Notes (Signed)
    SUBJECTIVE:   CHIEF COMPLAINT / HPI:   JAW PAIN - endorsing sharp pain to R jaw when taking first bite of food, lasts for 2-3 minutes at a time then typically goes away on its own. Occurring off and on for the past year but has had persistent pain since Sunday.  Duration: off and on x1 year Location: R jaw  Dentist evaluation: no Mechanism of injury:  no trauma Quality: sharp pain Frequency: intermittent, but constant since Sunday Radiation: under R eye and up head occasionally Aggravating factors: eating foods with vinegar Alleviating factors:  heating pad, shower Status: worse Treatments attempted: APAP and NSAIDs, amoxicillin (took 2-3 days without improvement), listerine eases a little, heating pad and shower helped some Relief with NSAIDs?: no Fevers: no Swelling:  some Redness: no Paresthesias / decreased sensation:  some paresthesias Sinus pressure:  sometimes Dry mouth: no Noticed sometimes worse at night.   OBJECTIVE:   BP 130/80   Pulse 84   Temp 98.4 F (36.9 C) (Oral)   Resp 18   Ht 5\' 8"  (1.727 m)   Wt 191 lb 14.4 oz (87 kg)   SpO2 98%   BMI 29.18 kg/m   Gen: well appearing, in NAD HEENT: MMM, oropharynx clear without erythema or exudate. Dentition fair. No cervical, submandibular, or postauricular lymphadenopathy. Jaw moves in fluid motion without popping, clicking, or dislocation. No overlying redness or swelling or rash. Area nonTTP.  Bedside US performed without notable enlargement to b/l parotid glands, surrounding lymph nodes and without increased vascularity. No stones appreciable.  ASSESSMENT/PLAN:   Jaw pain Most consistent with salivary gland pathology given location, character and timin of symptoms. No mass or lymphadenopathy appreciated on exam. Moist MM so unlikely to have salivary stone. Orophyarnx without evidence of abcess or dental infection. Possible salivary gland inflammation/sialosis given metabolic risk factor with diabetes and  smoking though symptoms are intermittent and mostly unilateral. No abnormalities appreciated on bedside US, will obtain formal imaging. Also obtain CBC to assess for infection. Recommend dental evaluation as well given due for routine check up. F/u pending results of lab/imaging.     Myles Gip, DO

## 2021-05-26 NOTE — Patient Instructions (Addendum)
It was great to see you!  Our plans for today:  - We are getting an ultrasound of the area, someone will call you to schedule this.  - Schedule an appointment with your dentist. - We are checking some labs today, we will release these results to your MyChart.  Take care and seek immediate care sooner if you develop any concerns.   Dr. Ky Barban

## 2021-05-26 NOTE — Assessment & Plan Note (Addendum)
Most consistent with salivary gland pathology given location, character and timin of symptoms. No mass or lymphadenopathy appreciated on exam. Moist MM so unlikely to have salivary stone. Orophyarnx without evidence of abcess or dental infection. Possible salivary gland inflammation/sialosis given metabolic risk factor with diabetes and smoking though symptoms are intermittent and mostly unilateral. No abnormalities appreciated on bedside US, will obtain formal imaging. Also obtain CBC to assess for infection. Recommend dental evaluation as well given due for routine check up. F/u pending results of lab/imaging.

## 2021-05-30 ENCOUNTER — Other Ambulatory Visit: Payer: Self-pay | Admitting: Family Medicine

## 2021-05-30 DIAGNOSIS — I10 Essential (primary) hypertension: Secondary | ICD-10-CM

## 2021-06-07 ENCOUNTER — Other Ambulatory Visit: Payer: Self-pay | Admitting: Family Medicine

## 2021-06-07 NOTE — Telephone Encounter (Signed)
  Notes to clinic:  Review for refill Looks like medication was given short term    Requested Prescriptions  Pending Prescriptions Disp Refills   naproxen (NAPROSYN) 500 MG tablet [Pharmacy Med Name: Naproxen 500 MG Oral Tablet] 20 tablet 0    Sig: TAKE 1 TABLET BY MOUTH TWICE DAILY WITH A MEAL FOR 10 DAYS      Analgesics:  NSAIDS Passed - 06/07/2021 11:47 AM      Passed - Cr in normal range and within 360 days    Creat  Date Value Ref Range Status  03/12/2021 1.01 0.60 - 1.35 mg/dL Final   Creatinine, Urine  Date Value Ref Range Status  11/08/2018 43 20 - 320 mg/dL Final          Passed - HGB in normal range and within 360 days    Hemoglobin  Date Value Ref Range Status  05/26/2021 15.6 13.2 - 17.1 g/dL Final  09/04/2015 16.2 12.6 - 17.7 g/dL Final          Passed - Patient is not pregnant      Passed - Valid encounter within last 12 months    Recent Outpatient Visits           1 week ago Jaw pain   [facility] Rory Percy M, DO   2 weeks ago Screening for colon cancer   [facility] [place], Malachy Mood, NP   2 months ago Uncontrolled type 2 diabetes mellitus with hyperglycemia, with long-term current use of insulin [facility])   [facility] [place], Kristeen Miss, PA-C   8 months ago Uncontrolled type 2 diabetes mellitus with hyperglycemia, with long-term current use of insulin [facility])   [facility] Delsa Grana, PA-C   1 year ago Right foot pain   [facility] Delsa Grana, PA-C       Future Appointments             In 1 week [place]

## 2021-06-07 NOTE — Telephone Encounter (Signed)
Last seen 7.13.2022 no upcoming sch'd

## 2021-06-10 ENCOUNTER — Encounter: Payer: Self-pay | Admitting: Podiatry

## 2021-06-10 ENCOUNTER — Ambulatory Visit (INDEPENDENT_AMBULATORY_CARE_PROVIDER_SITE_OTHER): Payer: BC Managed Care – PPO | Admitting: Podiatry

## 2021-06-10 ENCOUNTER — Other Ambulatory Visit: Payer: Self-pay

## 2021-06-10 DIAGNOSIS — L97521 Non-pressure chronic ulcer of other part of left foot limited to breakdown of skin: Secondary | ICD-10-CM

## 2021-06-10 DIAGNOSIS — Z794 Long term (current) use of insulin: Secondary | ICD-10-CM

## 2021-06-10 DIAGNOSIS — E1165 Type 2 diabetes mellitus with hyperglycemia: Secondary | ICD-10-CM

## 2021-06-10 DIAGNOSIS — Q666 Other congenital valgus deformities of feet: Secondary | ICD-10-CM | POA: Diagnosis not present

## 2021-06-10 NOTE — Progress Notes (Signed)
Subjective:  Patient ID: Henry Fuller, male    DOB: 1976/08/03,  MRN: [id number]  Chief Complaint  Patient presents with   Callouses    45 y.o. male presents for wound care.  Patient presents with a follow-up of left submetatarsal 5 ulceration/preulcerative callus.  He is a diabetic and uncontrolled.  His fifth metatarsophalangeal joint pain is staying closed.  He is still having pain however the ulcer staying closed.  He is here to pick up his orthotics.   Review of Systems: Negative except as noted in the HPI. Denies N/V/F/Ch.  Past Medical History:  Diagnosis Date   Concussion    Diabetes mellitus without complication ([place])    type 2   Diabetic retinopathy associated with controlled type 2 diabetes mellitus ([place]) 05/25/2018   Dr. Ellin Mayhew, Sept 2018   Erectile dysfunction    GERD (gastroesophageal reflux disease)    Hyperlipidemia    Hypertension    controlled on meds   Hypogonadism in male    Loose tooth due to trauma    front   Post concussion syndrome 12/12/2018   Vitamin D deficiency     Current Outpatient Medications:    atorvastatin (LIPITOR) 40 MG tablet, Take 1 tablet (40 mg total) by mouth daily., Disp: 30 tablet, Rfl: 11   B-D ULTRAFINE III SHORT PEN 31G X 8 MM MISC, , Disp: , Rfl:    benazepril-hydrochlorthiazide (LOTENSIN HCT) 20-12.5 MG tablet, Take 1 tablet by mouth daily., Disp: 30 tablet, Rfl: 11   clopidogrel (PLAVIX) 75 MG tablet, Take 1 tablet (75 mg total) by mouth daily., Disp: 30 tablet, Rfl: 11   Continuous Blood Gluc Sensor (FREESTYLE LIBRE 14 DAY SENSOR) MISC, Use 1 kit every 14 (fourteen) days (Patient not taking: No sig reported), Disp: , Rfl:    insulin aspart (NOVOLOG) 100 UNIT/ML FlexPen, Inject 12 Units as directed 3 (three) times daily before meals., Disp: , Rfl:    NEEDLE, DISP, 18 G (BD DISP NEEDLES) 18G X 1-1/2" MISC, Use this needle to draw up medication as directed. (Patient not taking: No sig reported), Disp: 25 each, Rfl: 0    sildenafil (VIAGRA) 100 MG tablet, Take 1 tablet (100 mg total) by mouth daily as needed for erectile dysfunction (take 0.5-1 tablet 1 hour prior to intercourse)., Disp: 10 tablet, Rfl: 3   SYRINGE-NEEDLE, DISP, 3 ML (SAFETY SYRINGE/NEEDLE) 23G X 1" 3 ML MISC, Use this needle to inject medication as directed. (Patient not taking: No sig reported), Disp: 25 each, Rfl: 0   testosterone (ANDROGEL) 50 MG/5GM (1%) GEL, Place 5 g onto the skin daily., Disp: 180 g, Rfl: 0   TOUJEO SOLOSTAR 300 UNIT/ML Solostar Pen, Inject 50 Units into the skin daily., Disp: , Rfl:   Social History   Tobacco Use  Smoking Status Every Day   Packs/day: 1.00   Years: 10.00   Pack years: 10.00   Types: Cigarettes  Smokeless Tobacco Never    Allergies  Allergen Reactions   Atenolol Other (See Comments)    Chest pressure   Ethanol     Other reaction(s): Unknown   Objective:  There were no vitals filed for this visit. There is no height or weight on file to calculate BMI. Constitutional Well developed. Well nourished.  Vascular Dorsalis pedis pulses palpable bilaterally. Posterior tibial pulses palpable bilaterally. Capillary refill normal to all digits.  No cyanosis or clubbing noted. Pedal hair growth normal.  Neurologic Normal speech. Oriented to person, place, and time. Protective sensation  absent  Dermatologic Wound Location: Left fifth submetatarsal 5 ulceration clinically healed.  No further signs of reulceration noted.  No clinical signs of infection noted.  Orthopedic: No pain to palpation either foot.   Radiographs: None Assessment:   No diagnosis found.   Plan:  Patient was evaluated and treated and all questions answered.  Ulcer left submetatarsal 5 ulceration limited to the breakdown of the skin -Clinically healed with immobilization.  Given that this has clinically healed and no further signs of recurrent RV ulceration noted.  I will still like for him to continue using surgical shoe  until he can get himself into orthotics with offloading pad.  I discussed this with the patient extensive detail he states understanding.  If any foot and ankle issues arise in future come and see me right away.  He will  Pes planovalgus -I explained the patient the etiology of pes planovalgus and various treatment options were extensively discussed.  Given the foot structure that he has with excessive pressure submetatarsal 5 he will benefit from custom-made orthotics with offloading of bilateral submetatarsal 5.  I discussed this with the patient in extensive detail he states understanding.  This will also help take the pressure off of the ulceration and heel lift. -Orthotics were dispensed and offloading the submetatarsal 5.  They are functioning well.   No follow-ups on file.

## 2021-06-11 DIAGNOSIS — M79676 Pain in unspecified toe(s): Secondary | ICD-10-CM

## 2021-06-14 ENCOUNTER — Telehealth (INDEPENDENT_AMBULATORY_CARE_PROVIDER_SITE_OTHER): Payer: Self-pay | Admitting: Gastroenterology

## 2021-06-14 DIAGNOSIS — Z800 Family history of malignant neoplasm of digestive organs: Secondary | ICD-10-CM

## 2021-06-14 DIAGNOSIS — Z1211 Encounter for screening for malignant neoplasm of colon: Secondary | ICD-10-CM

## 2021-06-14 MED ORDER — PEG 3350-KCL-NA BICARB-NACL 420 G PO SOLR: 4000.0000 mL | Freq: Once | ORAL | 0 refills | Status: AC

## 2021-06-14 NOTE — Progress Notes (Signed)
Gastroenterology Pre-Procedure Review  Request Date: 06/21/21 Requesting Physician: Dr. Allen Norris  PATIENT REVIEW QUESTIONS: The patient responded to the following health history questions as indicated:    1. Are you having any GI issues? no 2. Do you have a personal history of Polyps? no 3. Do you have a family history of Colon Cancer or Polyps? yes (unsure) 4. Diabetes Mellitus? yes (Type II) 5. Joint replacements in the past 12 months?no 6. Major health problems in the past 3 months?no 7. Any artificial heart valves, MVP, or defibrillator?no    MEDICATIONS & ALLERGIES:    Patient reports the following regarding taking any anticoagulation/antiplatelet therapy:   Plavix, Coumadin, Eliquis, Xarelto, Lovenox, Pradaxa, Brilinta, or Effient? Yes, Plavix 75 mg Aspirin? no  Patient confirms/reports the following medications:  Current Outpatient Medications  Medication Sig Dispense Refill   atorvastatin (LIPITOR) 40 MG tablet Take 1 tablet (40 mg total) by mouth daily. 30 tablet 11   B-D ULTRAFINE III SHORT PEN 31G X 8 MM MISC  (Patient not taking: No sig reported)     benazepril-hydrochlorthiazide (LOTENSIN HCT) 20-12.5 MG tablet Take 1 tablet by mouth daily. 30 tablet 11   clopidogrel (PLAVIX) 75 MG tablet Take 1 tablet (75 mg total) by mouth daily. 30 tablet 11   Continuous Blood Gluc Sensor (FREESTYLE LIBRE 14 DAY SENSOR) MISC Use 1 kit every 14 (fourteen) days (Patient not taking: No sig reported)     insulin aspart (NOVOLOG) 100 UNIT/ML FlexPen Inject 12 Units as directed 3 (three) times daily before meals.     NEEDLE, DISP, 18 G (BD DISP NEEDLES) 18G X 1-1/2" MISC Use this needle to draw up medication as directed. (Patient not taking: No sig reported) 25 each 0   sildenafil (VIAGRA) 100 MG tablet Take 1 tablet (100 mg total) by mouth daily as needed for erectile dysfunction (take 0.5-1 tablet 1 hour prior to intercourse). 10 tablet 3   SYRINGE-NEEDLE, DISP, 3 ML (SAFETY SYRINGE/NEEDLE) 23G  X 1" 3 ML MISC Use this needle to inject medication as directed. (Patient not taking: No sig reported) 25 each 0   testosterone (ANDROGEL) 50 MG/5GM (1%) GEL Place 5 g onto the skin daily. 180 g 0   TOUJEO SOLOSTAR 300 UNIT/ML Solostar Pen Inject 50 Units into the skin daily.     No current facility-administered medications for this visit.    Patient confirms/reports the following allergies:  Allergies  Allergen Reactions   Atenolol Other (See Comments)    Chest pressure   Ethanol     Other reaction(s): Unknown    No orders of the defined types were placed in this encounter.   AUTHORIZATION INFORMATION Primary Insurance: 1D#: Group #:  Secondary Insurance: 1D#: Group #:  SCHEDULE INFORMATION: Date: 06/21/21 Time: Location: [place]

## 2021-06-15 ENCOUNTER — Encounter: Payer: Self-pay | Admitting: Gastroenterology

## 2021-06-15 ENCOUNTER — Telehealth: Payer: Self-pay | Admitting: Gastroenterology

## 2021-06-15 ENCOUNTER — Other Ambulatory Visit: Payer: Self-pay

## 2021-06-15 ENCOUNTER — Ambulatory Visit
Admission: RE | Admit: 2021-06-15 | Discharge: 2021-06-15 | Disposition: A | Discharge status: Home / Self Care | Payer: BC Managed Care – PPO | Source: Ambulatory Visit | Attending: Family Medicine | Admitting: Family Medicine

## 2021-06-15 DIAGNOSIS — R6884 Jaw pain: Secondary | ICD-10-CM | POA: Insufficient documentation

## 2021-06-15 DIAGNOSIS — R590 Localized enlarged lymph nodes: Secondary | ICD-10-CM | POA: Diagnosis not present

## 2021-06-15 NOTE — Telephone Encounter (Signed)
Patient wants to change procedure date to Aug 11th,2022 if it's still available. Clinical staff will follow up with patient.

## 2021-06-16 ENCOUNTER — Encounter: Payer: Self-pay | Admitting: Unknown Physician Specialty

## 2021-06-16 ENCOUNTER — Other Ambulatory Visit: Payer: Self-pay | Admitting: Family Medicine

## 2021-06-16 MED ORDER — NAPROXEN 500 MG PO TABS: 500.0000 mg | ORAL_TABLET | Freq: Two times a day (BID) | ORAL | 0 refills | Status: AC

## 2021-06-17 DIAGNOSIS — K1121 Acute sialoadenitis: Secondary | ICD-10-CM | POA: Diagnosis not present

## 2021-06-18 ENCOUNTER — Telehealth: Payer: Self-pay

## 2021-06-18 NOTE — Telephone Encounter (Signed)
Copied from [place] [id number]. Topic: General - Other >> Jun 18, 2021 11:36 AM Pawlus, Brayton Layman A wrote: Reason for CRM: [facility] stated they have faxed over a request for a blood thinner for the pt a few times, please check if this has been received.

## 2021-06-18 NOTE — Telephone Encounter (Signed)
Faxed

## 2021-06-18 NOTE — Telephone Encounter (Signed)
Contacted patient regarding blood thinner clearance. Per Delsa Grana, NP; patient can stop Plavix 5 days before procedure. Pt states he has not taken Plavix in 3 weeks. Pt verbalized understanding.

## 2021-06-23 DIAGNOSIS — M79676 Pain in unspecified toe(s): Secondary | ICD-10-CM

## 2021-07-14 DIAGNOSIS — E1165 Type 2 diabetes mellitus with hyperglycemia: Secondary | ICD-10-CM | POA: Diagnosis not present

## 2021-07-14 DIAGNOSIS — E1142 Type 2 diabetes mellitus with diabetic polyneuropathy: Secondary | ICD-10-CM | POA: Diagnosis not present

## 2021-07-14 DIAGNOSIS — E113293 Type 2 diabetes mellitus with mild nonproliferative diabetic retinopathy without macular edema, bilateral: Secondary | ICD-10-CM | POA: Diagnosis not present

## 2021-07-14 DIAGNOSIS — Z794 Long term (current) use of insulin: Secondary | ICD-10-CM | POA: Diagnosis not present

## 2021-07-15 ENCOUNTER — Encounter: Payer: Self-pay | Admitting: Gastroenterology

## 2021-07-15 ENCOUNTER — Ambulatory Visit
Admission: RE | Disposition: A | Discharge status: Home / Self Care | Payer: Self-pay | Source: Home / Self Care | Attending: Gastroenterology

## 2021-07-15 ENCOUNTER — Ambulatory Visit: Payer: BC Managed Care – PPO | Admitting: Anesthesiology

## 2021-07-15 ENCOUNTER — Other Ambulatory Visit: Payer: Self-pay

## 2021-07-15 ENCOUNTER — Ambulatory Visit
Admission: RE | Admit: 2021-07-15 | Discharge: 2021-07-15 | Disposition: A | Discharge status: Home / Self Care | Payer: BC Managed Care – PPO | Attending: Gastroenterology | Admitting: Gastroenterology

## 2021-07-15 DIAGNOSIS — F17210 Nicotine dependence, cigarettes, uncomplicated: Secondary | ICD-10-CM | POA: Diagnosis not present

## 2021-07-15 DIAGNOSIS — Z7902 Long term (current) use of antithrombotics/antiplatelets: Secondary | ICD-10-CM | POA: Diagnosis not present

## 2021-07-15 DIAGNOSIS — Z1211 Encounter for screening for malignant neoplasm of colon: Secondary | ICD-10-CM | POA: Diagnosis not present

## 2021-07-15 DIAGNOSIS — Z888 Allergy status to other drugs, medicaments and biological substances status: Secondary | ICD-10-CM | POA: Diagnosis not present

## 2021-07-15 DIAGNOSIS — Z791 Long term (current) use of non-steroidal anti-inflammatories (NSAID): Secondary | ICD-10-CM | POA: Insufficient documentation

## 2021-07-15 DIAGNOSIS — Z800 Family history of malignant neoplasm of digestive organs: Secondary | ICD-10-CM | POA: Diagnosis not present

## 2021-07-15 DIAGNOSIS — Z794 Long term (current) use of insulin: Secondary | ICD-10-CM | POA: Diagnosis not present

## 2021-07-15 DIAGNOSIS — D128 Benign neoplasm of rectum: Secondary | ICD-10-CM | POA: Diagnosis not present

## 2021-07-15 DIAGNOSIS — Z79899 Other long term (current) drug therapy: Secondary | ICD-10-CM | POA: Diagnosis not present

## 2021-07-15 DIAGNOSIS — K621 Rectal polyp: Secondary | ICD-10-CM | POA: Diagnosis not present

## 2021-07-15 DIAGNOSIS — K640 First degree hemorrhoids: Secondary | ICD-10-CM | POA: Insufficient documentation

## 2021-07-15 HISTORY — PX: COLONOSCOPY WITH PROPOFOL: SHX5780

## 2021-07-15 LAB — GLUCOSE, CAPILLARY
Glucose-Capillary: 199 mg/dL — ABNORMAL HIGH (ref 70–99)
Glucose-Capillary: 219 mg/dL — ABNORMAL HIGH (ref 70–99)

## 2021-07-15 SURGERY — COLONOSCOPY WITH PROPOFOL
Anesthesia: General

## 2021-07-15 MED ORDER — PROPOFOL 10 MG/ML IV BOLUS
INTRAVENOUS | Status: DC | PRN
  Administered 2021-07-15 (×4): 30 mg via INTRAVENOUS
  Administered 2021-07-15: 50 mg via INTRAVENOUS
  Administered 2021-07-15: 30 mg via INTRAVENOUS
  Administered 2021-07-15: 150 mg via INTRAVENOUS

## 2021-07-15 MED ORDER — ONDANSETRON HCL 4 MG/2ML IJ SOLN: 4.0000 mg | Freq: Once | INTRAMUSCULAR | Status: DC | PRN

## 2021-07-15 MED ORDER — ACETAMINOPHEN 160 MG/5ML PO SOLN: 325.0000 mg | ORAL | Status: DC | PRN

## 2021-07-15 MED ORDER — SODIUM CHLORIDE 0.9 % IV SOLN: INTRAVENOUS | Status: DC

## 2021-07-15 MED ORDER — LACTATED RINGERS IV SOLN: INTRAVENOUS | Status: DC

## 2021-07-15 MED ORDER — ACETAMINOPHEN 325 MG PO TABS: 325.0000 mg | ORAL_TABLET | ORAL | Status: DC | PRN

## 2021-07-15 MED ORDER — LIDOCAINE HCL (CARDIAC) PF 100 MG/5ML IV SOSY
PREFILLED_SYRINGE | INTRAVENOUS | Status: DC | PRN
  Administered 2021-07-15: 30 mg via INTRAVENOUS

## 2021-07-15 SURGICAL SUPPLY — 17 items
CLIP HMST 235XBRD CATH ROT (MISCELLANEOUS) IMPLANT
CLIP RESOLUTION 360 11X235 (MISCELLANEOUS)
ELECT REM PT RETURN 9FT ADLT (ELECTROSURGICAL)
ELECTRODE REM PT RTRN 9FT ADLT (ELECTROSURGICAL) IMPLANT
FORCEPS BIOP RAD 4 LRG CAP 4 (CUTTING FORCEPS) ×2 IMPLANT
GOWN CVR UNV OPN BCK APRN NK (MISCELLANEOUS) ×2 IMPLANT
GOWN ISOL THUMB LOOP REG UNIV (MISCELLANEOUS) ×4
INJECTOR VARIJECT VIN23 (MISCELLANEOUS) IMPLANT
KIT PRC NS LF DISP ENDO (KITS) ×1 IMPLANT
KIT PROCEDURE OLYMPUS (KITS) ×2
MANIFOLD NEPTUNE II (INSTRUMENTS) ×2 IMPLANT
MARKER SPOT ENDO TATTOO 5ML (MISCELLANEOUS) IMPLANT
SNARE COLD EXACTO (MISCELLANEOUS) IMPLANT
SPOT EX ENDOSCOPIC TATTOO (MISCELLANEOUS)
TRAP ETRAP POLY (MISCELLANEOUS) IMPLANT
VARIJECT INJECTOR VIN23 (MISCELLANEOUS)
WATER STERILE IRR 250ML POUR (IV SOLUTION) ×2 IMPLANT

## 2021-07-15 NOTE — Anesthesia Procedure Notes (Signed)
Date/Time: 07/15/2021 9:19 AM Performed by: Cameron Ali, CRNA Pre-anesthesia Checklist: Patient identified, Emergency Drugs available, Suction available, Timeout performed and Patient being monitored Patient Re-evaluated:Patient Re-evaluated prior to induction Oxygen Delivery Method: Nasal cannula Placement Confirmation: positive ETCO2

## 2021-07-15 NOTE — Transfer of Care (Signed)
Immediate Anesthesia Transfer of Care Note  Patient: Henry Fuller  Procedure(s) Performed: COLONOSCOPY WITH PROPOFOL  Patient Location: PACU  Anesthesia Type: General  Level of Consciousness: awake, alert  and patient cooperative  Airway and Oxygen Therapy: Patient Spontanous Breathing and Patient connected to supplemental oxygen  Post-op Assessment: Post-op Vital signs reviewed, Patient's Cardiovascular Status Stable, Respiratory Function Stable, Patent Airway and No signs of Nausea or vomiting  Post-op Vital Signs: Reviewed and stable  Complications: No notable events documented.

## 2021-07-15 NOTE — H&P (Signed)
Henry Lame, MD [address] [place], [place] [address] Phone:[phone] Fax : [phone]  Primary Care Physician:  Delsa Grana, PA-C Primary Gastroenterologist:  Dr. Allen Norris  Pre-Procedure History & Physical: HPI:  Henry Fuller is a 45 y.o. male is here for an colonoscopy.   Past Medical History:  Diagnosis Date   Concussion    Diabetes mellitus without complication ([place])    type 2   Diabetic retinopathy associated with controlled type 2 diabetes mellitus ([place]) 05/25/2018   Dr. Ellin Mayhew, Sept 2018   Erectile dysfunction    GERD (gastroesophageal reflux disease)    Hyperlipidemia    Hypertension    controlled on meds   Hypogonadism in male    Loose tooth due to trauma    front   Post concussion syndrome 12/12/2018   Vitamin D deficiency     Past Surgical History:  Procedure Laterality Date   COLONOSCOPY WITH PROPOFOL N/A 09/17/2015   Procedure: COLONOSCOPY WITH PROPOFOL;  Surgeon: Henry Lame, MD;  Location: [facility];  Service: Endoscopy;  Laterality: N/A;  DIABETIC   ESOPHAGOGASTRODUODENOSCOPY (EGD) WITH PROPOFOL N/A 09/17/2015   Procedure: ESOPHAGOGASTRODUODENOSCOPY (EGD) WITH PROPOFOL;  Surgeon: Henry Lame, MD;  Location: [facility];  Service: Endoscopy;  Laterality: N/A;   ESOPHAGOGASTRODUODENOSCOPY (EGD) WITH PROPOFOL N/A 10/05/2015   Procedure: ESOPHAGOGASTRODUODENOSCOPY (EGD) WITH PROPOFOL;  Surgeon: Henry Lame, MD;  Location: [facility];  Service: Endoscopy;  Laterality: N/A;   EUS N/A 10/15/2015   Procedure: UPPER ENDOSCOPIC ULTRASOUND (EUS) LINEAR;  Surgeon: [name], MD;  Location: [facility];  Service: Gastroenterology;  Laterality: N/A;   HERNIA REPAIR  1194   umbilical   LOWER EXTREMITY ANGIOGRAPHY Right 08/18/2020   Procedure: LOWER EXTREMITY ANGIOGRAPHY;  Surgeon: Katha Cabal, MD;  Location: [facility];  Service: Cardiovascular;  Laterality: Right;   VASECTOMY  2001     Prior to Admission medications   Medication Sig Start Date End Date Taking? Authorizing Provider  atorvastatin (LIPITOR) 40 MG tablet Take 1 tablet (40 mg total) by mouth daily. 03/12/21  Yes Delsa Grana, PA-C  benazepril-hydrochlorthiazide (LOTENSIN HCT) 20-12.5 MG tablet Take 1 tablet by mouth daily. 03/12/21  Yes Delsa Grana, PA-C  clopidogrel (PLAVIX) 75 MG tablet Take 1 tablet (75 mg total) by mouth daily. 03/12/21  Yes Delsa Grana, PA-C  insulin aspart (NOVOLOG) 100 UNIT/ML FlexPen Inject 12 Units as directed 3 (three) times daily before meals. 01/03/18  Yes [provider]  NEEDLE, DISP, 18 G (BD DISP NEEDLES) 18G X 1-1/2" MISC Use this needle to draw up medication as directed. 03/03/21  Yes Stoioff, Ronda Fairly, MD  sildenafil (VIAGRA) 100 MG tablet Take 1 tablet (100 mg total) by mouth daily as needed for erectile dysfunction (take 0.5-1 tablet 1 hour prior to intercourse). 05/15/20  Yes Stoioff, Ronda Fairly, MD  TOUJEO SOLOSTAR 300 UNIT/ML Solostar Pen Inject 50 Units into the skin daily. 01/17/21  Yes Solum, Betsey Holiday, MD  B-D ULTRAFINE III SHORT PEN 31G X 8 MM MISC  12/19/18   [provider]  Continuous Blood Gluc Sensor (FREESTYLE LIBRE 14 DAY SENSOR) MISC Use 1 kit every 14 (fourteen) days Patient not taking: No sig reported 12/18/18   [provider]  naproxen (NAPROSYN) 500 MG tablet Take 1 tablet (500 mg total) by mouth 2 (two) times daily with a meal. 06/16/21   Rumball, Jake Church, DO  SYRINGE-NEEDLE, DISP, 3 ML (SAFETY SYRINGE/NEEDLE) 23G X 1" 3 ML  MISC Use this needle to inject medication as directed. Patient not taking: No sig reported 03/03/21   Abbie Sons, MD  testosterone (ANDROGEL) 50 MG/5GM (1%) GEL Place 5 g onto the skin daily. 04/02/21   Zara Council A, PA-C    Allergies as of 06/14/2021 - Review Complete 06/10/2021  Allergen Reaction Noted   Atenolol Other (See Comments) 12/31/2014   Ethanol  06/11/2019    Family History  Problem Relation Age  of Onset   Diabetes Mother    Hypertension Mother    Cancer Father        colon cancer   Diabetes Brother    Hypertension Brother    Diabetes Maternal Grandmother    Hypertension Maternal Grandmother    Heart disease Maternal Grandmother    Liver disease Maternal Grandfather    Cancer Paternal Grandmother    Stroke Paternal Grandfather    Diabetes Brother    Hypertension Brother     Social History   Socioeconomic History   Marital status: Married    Spouse name: April   Number of children: 3   Years of education: Not on file   Highest education level: High school graduate  Occupational History   Not on file  Tobacco Use   Smoking status: Every Day    Packs/day: 1.00    Years: 10.00    Pack years: 10.00    Types: Cigarettes   Smokeless tobacco: Never  Vaping Use   Vaping Use: Never used  Substance and Sexual Activity   Alcohol use: Yes    Comment: socially    Drug use: No   Sexual activity: Yes    Partners: Female  Other Topics Concern   Not on file  Social History Narrative   Not on file   Social Determinants of Health   Financial Resource Strain: Not on file  Food Insecurity: Not on file  Transportation Needs: Not on file  Physical Activity: Not on file  Stress: Not on file  Social Connections: Not on file  Intimate Partner Violence: Not on file    Review of Systems: See HPI, otherwise negative ROS  Physical Exam: BP (!) 159/96   Pulse 100   Temp 98.3 F (36.8 C) (Temporal)   Resp 18   Ht _0  (1.727 m)   Wt 82.6 kg   SpO2 97%   BMI 27.67 kg/m  General:   Alert,  pleasant and cooperative in NAD Head:  Normocephalic and atraumatic. Neck:  Supple; no masses or thyromegaly. Lungs:  Clear throughout to auscultation.    Heart:  Regular rate and rhythm. Abdomen:  Soft, nontender and nondistended. Normal bowel sounds, without guarding, and without rebound.   Neurologic:  Alert and  oriented x4;  grossly normal  neurologically.  Impression/Plan: Henry Fuller is here for an colonoscopy to be performed for family hisotry of colon cancer before 52.  Risks, benefits, limitations, and alternatives regarding  colonoscopy have been reviewed with the patient.  Questions have been answered.  All parties agreeable.   Henry Lame, MD  07/15/2021, 8:39 AM

## 2021-07-15 NOTE — Op Note (Signed)
[facility] Patient Name: Henry Fuller Procedure Date: 07/15/2021 9:12 AM MRN: [id number] Account #: [id number] Date of Birth: 06-22-1976 Admit Type: Outpatient Age: 45 Room: [facility] OR ROOM 01 Gender: Male Note Status: Finalized Procedure:             Colonoscopy Indications:           Screening for colorectal malignant neoplasm, Family                         history of colon cancer in a first-degree relative                         before age 35 years Providers:             [name] MD, MD Referring MD:          Delsa Grana (Referring MD) Medicines:             Propofol per Anesthesia Complications:         No immediate complications. Procedure:             Pre-Anesthesia Assessment:                        - Prior to the procedure, a History and Physical was                         performed, and patient medications and allergies were                         reviewed. The patient's tolerance of previous                         anesthesia was also reviewed. The risks and benefits                         of the procedure and the sedation options and risks                         were discussed with the patient. All questions were                         answered, and informed consent was obtained. Prior                         Anticoagulants: The patient has taken no previous                         anticoagulant or antiplatelet agents. ASA Grade                         Assessment: II - A patient with mild systemic disease.                         After reviewing the risks and benefits, the patient                         was deemed in satisfactory condition to undergo the  procedure.                        After obtaining informed consent, the colonoscope was                         passed under direct vision. Throughout the procedure,                         the patient's blood pressure, pulse, and oxygen                          saturations were monitored continuously. The                         Colonoscope was introduced through the anus and                         advanced to the the cecum, identified by appendiceal                         orifice and ileocecal valve. The colonoscopy was                         performed without difficulty. The patient tolerated                         the procedure well. The quality of the bowel                         preparation was excellent. Findings:      The perianal and digital rectal examinations were normal.      Two sessile polyps were found in the rectum. The polyps were 1 to 3 mm       in size. These polyps were removed with a cold biopsy forceps. Resection       and retrieval were complete.      Non-bleeding internal hemorrhoids were found during retroflexion. The       hemorrhoids were Grade I (internal hemorrhoids that do not prolapse). Impression:            - Two 1 to 3 mm polyps in the rectum, removed with a                         cold biopsy forceps. Resected and retrieved.                        - Non-bleeding internal hemorrhoids. Recommendation:        - Discharge patient to home.                        - Resume previous diet.                        - Continue present medications.                        - Await pathology results.                        - Repeat colonoscopy in  5 years for surveillance. Procedure Code(s):     --- Professional ---                        [phone], Colonoscopy, flexible; with biopsy, single or                         multiple Diagnosis Code(s):     --- Professional ---                        Z12.11, Encounter for screening for malignant neoplasm                         of colon                        K62.1, Rectal polyp CPT copyright 2019 American Medical Association. All rights reserved. The codes documented in this report are preliminary and upon coder review may  be revised to meet current compliance  requirements. [name] MD, MD 07/15/2021 9:40:00 AM This report has been signed electronically. Number of Addenda: 0 Note Initiated On: 07/15/2021 9:12 AM Scope Withdrawal Time: 0 hours 9 minutes 21 seconds  Total Procedure Duration: 0 hours 14 minutes 0 seconds  Estimated Blood Loss:  Estimated blood loss: none.      [facility]

## 2021-07-15 NOTE — Anesthesia Preprocedure Evaluation (Signed)
Anesthesia Evaluation  Patient identified by MRN, date of birth, ID band Patient awake    Reviewed: Allergy & Precautions, NPO status   Airway Mallampati: II  TM Distance: >3 FB     Dental   Pulmonary Current Smoker and Patient abstained from smoking.,    Pulmonary exam normal        Cardiovascular hypertension, + Peripheral Vascular Disease   Rhythm:Regular Rate:Normal  HLD   Neuro/Psych Depression Hx concussion with memory loss    GI/Hepatic GERD  ,  Endo/Other  diabetes (type 2, with retinopathy)  Renal/GU      Musculoskeletal  (+) Arthritis ,   Abdominal   Peds  Hematology   Anesthesia Other Findings   Reproductive/Obstetrics                             Anesthesia Physical Anesthesia Plan  ASA: 3  Anesthesia Plan: General   Post-op Pain Management:    Induction: Intravenous  PONV Risk Score and Plan: Propofol infusion, TIVA and Treatment may vary due to age or medical condition  Airway Management Planned: Natural Airway and Nasal Cannula  Additional Equipment:   Intra-op Plan:   Post-operative Plan:   Informed Consent: I have reviewed the patients History and Physical, chart, labs and discussed the procedure including the risks, benefits and alternatives for the proposed anesthesia with the patient or authorized representative who has indicated his/her understanding and acceptance.       Plan Discussed with: CRNA  Anesthesia Plan Comments:         Anesthesia Quick Evaluation

## 2021-07-15 NOTE — Anesthesia Postprocedure Evaluation (Signed)
Anesthesia Post Note  Patient: Henry Fuller  Procedure(s) Performed: COLONOSCOPY WITH PROPOFOL     Patient location during evaluation: PACU Anesthesia Type: General Level of consciousness: awake Pain management: pain level controlled Vital Signs Assessment: post-procedure vital signs reviewed and stable Respiratory status: respiratory function stable Cardiovascular status: stable Postop Assessment: no signs of nausea or vomiting Anesthetic complications: no   No notable events documented.  Veda Canning

## 2021-07-16 ENCOUNTER — Encounter: Payer: Self-pay | Admitting: Gastroenterology

## 2021-07-16 LAB — SURGICAL PATHOLOGY

## 2021-07-18 ENCOUNTER — Encounter: Payer: Self-pay | Admitting: Gastroenterology

## 2021-07-21 ENCOUNTER — Ambulatory Visit: Payer: BC Managed Care – PPO | Admitting: Urology

## 2021-07-27 DIAGNOSIS — M79676 Pain in unspecified toe(s): Secondary | ICD-10-CM

## 2021-08-02 ENCOUNTER — Encounter: Payer: Self-pay | Admitting: Urology

## 2021-08-02 ENCOUNTER — Other Ambulatory Visit: Payer: Self-pay | Admitting: *Deleted

## 2021-08-02 MED ORDER — SILDENAFIL CITRATE 100 MG PO TABS: 100.0000 mg | ORAL_TABLET | Freq: Every day | ORAL | 3 refills | Status: AC | PRN

## 2021-08-02 NOTE — Progress Notes (Signed)
08/03/2021 2:50 PM   Henry Fuller 11, 1977 [id number]  Referring provider: Delsa Grana, PA-C [address] [place],  [place] [address]  Urological history: 1. Testosterone deficiency -failed Clomid  -received 200 cc of testosterone cypionate on 03/04/2021  2. ED -contributing factors of age, smoking, DM, HLD, HTN and depression -SHIM 15 -managed with sildenafil 100 mg, on-demand-dosing  Chief Complaint  Patient presents with   Hypogonadism      HPI: Henry Fuller is a 45 y.o. male who presents to discuss testosterone treatment.    His insurance has denied coverage of topical gel and injections for testosterone therapy.  He states he feels fatigued, low libido and lack of satisfactory erections.  He has not had any form of testosterone therapy in over 2 months.  Patient is not having spontaneous erections.  He denies any pain or curvature with erections.     SHIM     Row Name 08/03/21 1509         SHIM: Over the last 6 months:   How do you rate your confidence that you could get and keep an erection? Low     When you had erections with sexual stimulation, how often were your erections hard enough for penetration (entering your partner)? Most Times (much more than half the time)     During sexual intercourse, how often were you able to maintain your erection after you had penetrated (entered) your partner? Sometimes (about half the time)     During sexual intercourse, how difficult was it to maintain your erection to completion of intercourse? Slightly Difficult     When you attempted sexual intercourse, how often was it satisfactory for you? A Few Times (much less than half the time)           SHIM Total Score   SHIM 15              Score: 1-7 Severe ED 8-11 Moderate ED 12-16 Mild-Moderate ED 17-21 Mild ED 22-25 No ED   PMH: Past Medical History:  Diagnosis Date   Concussion    Diabetes mellitus without complication  ([place])    type 2   Diabetic retinopathy associated with controlled type 2 diabetes mellitus ([place]) 05/25/2018   Dr. Ellin Mayhew, Sept 2018   Erectile dysfunction    GERD (gastroesophageal reflux disease)    Hyperlipidemia    Hypertension    controlled on meds   Hypogonadism in male    Loose tooth due to trauma    front   Post concussion syndrome 12/12/2018   Vitamin D deficiency     Surgical History: Past Surgical History:  Procedure Laterality Date   COLONOSCOPY WITH PROPOFOL N/A 09/17/2015   Procedure: COLONOSCOPY WITH PROPOFOL;  Surgeon: Henry Lame, MD;  Location: [place];  Service: Endoscopy;  Laterality: N/A;  DIABETIC   COLONOSCOPY WITH PROPOFOL N/A 07/15/2021   Procedure: COLONOSCOPY WITH PROPOFOL;  Surgeon: Henry Lame, MD;  Location: [place];  Service: Endoscopy;  Laterality: N/A;   ESOPHAGOGASTRODUODENOSCOPY (EGD) WITH PROPOFOL N/A 09/17/2015   Procedure: ESOPHAGOGASTRODUODENOSCOPY (EGD) WITH PROPOFOL;  Surgeon: Henry Lame, MD;  Location: [place];  Service: Endoscopy;  Laterality: N/A;   ESOPHAGOGASTRODUODENOSCOPY (EGD) WITH PROPOFOL N/A 10/05/2015   Procedure: ESOPHAGOGASTRODUODENOSCOPY (EGD) WITH PROPOFOL;  Surgeon: Henry Lame, MD;  Location: [place];  Service: Endoscopy;  Laterality: N/A;   EUS N/A 10/15/2015   Procedure: UPPER ENDOSCOPIC ULTRASOUND (EUS) LINEAR;  Surgeon: Henry Bodily,  MD;  Location: [facility];  Service: Gastroenterology;  Laterality: N/A;   HERNIA REPAIR  [id number]   umbilical   LOWER EXTREMITY ANGIOGRAPHY Right 08/18/2020   Procedure: LOWER EXTREMITY ANGIOGRAPHY;  Surgeon: Katha Cabal, MD;  Location: [place] [facility];  Service: Cardiovascular;  Laterality: Right;   VASECTOMY  2001    Home Medications:  Allergies as of 08/03/2021       Reactions   Atenolol Other (See Comments)   Chest pressure        Medication List        Accurate as of August 03, 2021 11:59 PM. If you have any  questions, ask your nurse or doctor.          STOP taking these medications    testosterone 50 MG/5GM (1%) Gel Commonly known as: ANDROGEL Stopped by: Deshannon Hinchliffe, PA-C       TAKE these medications    atorvastatin 40 MG tablet Commonly known as: LIPITOR Take 1 tablet (40 mg total) by mouth daily.   B-D ULTRAFINE III SHORT PEN 31G X 8 MM Misc Generic drug: Insulin Pen Needle   BD Disp Needles 18G X 1-1/2" Misc Generic drug: NEEDLE (DISP) 18 G Use this needle to draw up medication as directed.   benazepril-hydrochlorthiazide 20-12.5 MG tablet Commonly known as: LOTENSIN HCT Take 1 tablet by mouth daily.   clopidogrel 75 MG tablet Commonly known as: Plavix Take 1 tablet (75 mg total) by mouth daily.   FreeStyle Libre 14 Day Sensor Misc Use 1 kit every 14 (fourteen) days   insulin aspart 100 UNIT/ML FlexPen Commonly known as: NOVOLOG Inject 12 Units as directed 3 (three) times daily before meals.   naproxen 500 MG tablet Commonly known as: NAPROSYN Take 1 tablet (500 mg total) by mouth 2 (two) times daily with a meal.   Ozempic (0.25 or 0.5 MG/DOSE) 2 MG/1.5ML Sopn Generic drug: Semaglutide(0.25 or 0.5MG/DOS) Inject 0.5 mg into the skin once a week.   Safety Syringe/Needle 23G X 1" 3 ML Misc Generic drug: SYRINGE-NEEDLE (DISP) 3 ML Use this needle to inject medication as directed.   sildenafil 100 MG tablet Commonly known as: VIAGRA Take 1 tablet (100 mg total) by mouth daily as needed for erectile dysfunction (take 0.5-1 tablet 1 hour prior to intercourse).   Toujeo SoloStar 300 UNIT/ML Solostar Pen Generic drug: insulin glargine (1 Unit Dial) Inject 50 Units into the skin daily.        Allergies:  Allergies  Allergen Reactions   Atenolol Other (See Comments)    Chest pressure    Family History: Family History  Problem Relation Age of Onset   Diabetes Mother    Hypertension Mother    Cancer Father        colon cancer   Diabetes Brother     Hypertension Brother    Diabetes Maternal Grandmother    Hypertension Maternal Grandmother    Heart disease Maternal Grandmother    Liver disease Maternal Grandfather    Cancer Paternal Grandmother    Stroke Paternal Grandfather    Diabetes Brother    Hypertension Brother     Social History:  reports that he has been smoking cigarettes. He has a 10.00 pack-year smoking history. He has never used smokeless tobacco. He reports current alcohol use. He reports that he does not use drugs.  ROS: Pertinent ROS in HPI  Physical Exam: Blood pressure (!) 164/103, pulse 97, height '5\' 8"'  (1.727 m), weight 185 lb (83.9  kg). Constitutional:  Well nourished. Alert and oriented, No acute distress. HEENT: [place] AT, mask in place.  Trachea midline Cardiovascular: No clubbing, cyanosis, or edema. Respiratory: Normal respiratory effort, no increased work of breathing. Neurologic: Grossly intact, no focal deficits, moving all 4 extremities. Psychiatric: Normal mood and affect.   Laboratory Data: Lab Results  Component Value Date   WBC 9.7 05/26/2021   HGB 15.6 05/26/2021   HCT 46.0 05/26/2021   MCV 88.6 05/26/2021   PLT 325 05/26/2021    Lab Results  Component Value Date   CREATININE 1.01 03/12/2021    Lab Results  Component Value Date   TESTOSTERONE 532 04/26/2021    Lab Results  Component Value Date   HGBA1C 11.1 (H) 03/12/2021    Lab Results  Component Value Date   TSH 0.42 08/29/2019       Component Value Date/Time   CHOL 91 03/12/2021 1603   HDL 23 (L) 03/12/2021 1603   CHOLHDL 4.0 03/12/2021 1603   VLDL 25 12/15/2016 0902   LDLCALC 42 03/12/2021 1603    Lab Results  Component Value Date   AST 16 03/12/2021   Lab Results  Component Value Date   ALT 23 03/12/2021   Hemoglobin A1C 4.2 - 5.6 % 10.3 High    Average Blood Glucose (Calc) mg/dL 249   Resulting [facility] - LAB  Narrative Performed by [facility] - LAB Normal Range:     4.2 - 5.6%  Increased Risk:  5.7 - 6.4%  Diabetes:        >= 6.5%  Glycemic Control for adults with diabetes:  <7%   Specimen Collected: 07/14/21 10:31 Last Resulted: 07/14/21 10:40  Received From: [facility]  Result Received: 07/15/21 08:27  I have reviewed the labs.   Pertinent Imaging: N/A  Assessment & Plan:    1. Testosterone deficiency -Patient's insurance will not cover testosterone therapy therefore he will need to pay out-of-pocket -As he has purchased testosterone cypionate, we will go ahead and administer this medication until the vials have been used up -I will try to get him approved for Testopel going forward -PSA, testosterone level and H&H drawn today  2. ED -continue sildenafil 100 mg, on-demand-dosing   Return in about 2 weeks (around 08/17/2021) for Labs .  These notes generated with voice recognition software. I apologize for typographical errors.  Zara Council, PA-C  [facility] [address]  [place], [place] [address] [phone]

## 2021-08-03 ENCOUNTER — Ambulatory Visit (INDEPENDENT_AMBULATORY_CARE_PROVIDER_SITE_OTHER): Payer: BC Managed Care – PPO | Admitting: Urology

## 2021-08-03 ENCOUNTER — Other Ambulatory Visit: Payer: Self-pay

## 2021-08-03 ENCOUNTER — Encounter: Payer: Self-pay | Admitting: Urology

## 2021-08-03 VITALS — BP 164/103 | HR 97 | Ht 68.0 in | Wt 185.0 lb

## 2021-08-03 DIAGNOSIS — E349 Endocrine disorder, unspecified: Secondary | ICD-10-CM

## 2021-08-03 DIAGNOSIS — N529 Male erectile dysfunction, unspecified: Secondary | ICD-10-CM | POA: Diagnosis not present

## 2021-08-03 MED ORDER — TESTOSTERONE CYPIONATE 200 MG/ML IM SOLN
200.0000 mg | Freq: Once | INTRAMUSCULAR | Status: AC
  Administered 2021-08-03: 200 mg via INTRAMUSCULAR

## 2021-08-03 NOTE — Progress Notes (Signed)
Testosterone IM Injection  Due to Hypogonadism patient is present today for a Testosterone Injection.  Medication: Testosterone Cypionate Dose: 200mg /23mL Location: right upper outer buttocks Lot: [id number]  Exp:07/2023  Patient tolerated well, no complications were noted.  Preformed by: Gordy Clement, CMA   Follow up: RTC in 2 weeks for labs

## 2021-08-09 ENCOUNTER — Telehealth: Payer: Self-pay | Admitting: Urology

## 2021-08-09 NOTE — Telephone Encounter (Signed)
Would you see if his insurance would cover Testopel for him?

## 2021-08-19 ENCOUNTER — Telehealth: Payer: Self-pay | Admitting: Family Medicine

## 2021-08-19 NOTE — Telephone Encounter (Signed)
LMOM for patient to return call. Need to go over insurance information for in office procedure.

## 2021-08-20 ENCOUNTER — Other Ambulatory Visit: Payer: Self-pay

## 2021-08-20 ENCOUNTER — Ambulatory Visit (INDEPENDENT_AMBULATORY_CARE_PROVIDER_SITE_OTHER): Payer: BC Managed Care – PPO

## 2021-08-20 ENCOUNTER — Other Ambulatory Visit: Payer: BC Managed Care – PPO

## 2021-08-20 ENCOUNTER — Other Ambulatory Visit: Payer: Self-pay | Admitting: Urology

## 2021-08-20 DIAGNOSIS — E349 Endocrine disorder, unspecified: Secondary | ICD-10-CM

## 2021-08-20 MED ORDER — TESTOSTERONE CYPIONATE 200 MG/ML IM SOLN: 200.0000 mg | INTRAMUSCULAR | 0 refills | Status: DC

## 2021-08-20 MED ORDER — TESTOSTERONE CYPIONATE 200 MG/ML IM SOLN
200.0000 mg | Freq: Once | INTRAMUSCULAR | Status: AC
  Administered 2021-08-20: 200 mg via INTRAMUSCULAR

## 2021-08-20 NOTE — Progress Notes (Signed)
Testosterone IM Injection  Due to Hypogonadism patient is present today for a Testosterone Injection.  Medication: Testosterone Cypionate Dose: 200mg /55ml Location: right upper outer buttocks Lot: [id number] Exp:07/2023   Patient tolerated well, no complications were noted.  Performed by: Gordy Clement, CMA  Follow up: Will defer scheduling pt until labs return and decision is made on Testopel.

## 2021-08-20 NOTE — Telephone Encounter (Signed)
Spoke to patient about Testopel approval from insurance. He is still wanting to stick with testosterone cypionate. He is needing a refill on the medication.

## 2021-08-21 LAB — PSA: Prostate Specific Ag, Serum: 0.6 ng/mL (ref 0.0–4.0)

## 2021-08-21 LAB — TESTOSTERONE: Testosterone: 207 ng/dL — ABNORMAL LOW (ref 264–916)

## 2021-08-21 LAB — HEMOGLOBIN AND HEMATOCRIT, BLOOD
Hematocrit: 45.9 % (ref 37.5–51.0)
Hemoglobin: 15.5 g/dL (ref 13.0–17.7)

## 2021-09-02 NOTE — Progress Notes (Signed)
This is a 45 -year-old male with hypogonadism and he is managed with Testopel. He presents today for Testopel insertion.  Identified upper outer quadrant of hip for insertion; prepped area with right and injected 10 cc's of Lidocaine 1% with Epinephrine to anesthetize superficially and distally along trocar tract.  Made 3 mm incision using 11 blade of scalpel; trocar with sharp ended stylet was inserted into subcutaneous tissue in line with femur. Sharp stylet was withdrawn and 6 pellets were placed into trocar well. Testopel pellets advanced into tissue using blunt ended stylet. Trocar removed and incision closed using 6 Steri-Strips. Cleansed area to remove Betadine and covered Steri-Strips with outer Band-Aid.  Careful inspection of insertion is done and patient informed of post procedure instructions.  Advised patient to apply ice to the site for 20-30 minutes every hour if needed.  Avoid hot tubes, swimming or full water immersion of the insertion site for 72 hours.  Bandage may be removed after one week.    Patient is advised to contact the office if experiencing drainage of the insertion site, excessive redness or swelling of the site, chills and/or fevers > 101.5, nausea or vomiting, dizziness or lightheadedness and excessive tenderness.  Avoid strenuous activity and heavy lifting for 72 hours.     He will return in one month for serum testosterone.

## 2021-09-03 ENCOUNTER — Other Ambulatory Visit: Payer: Self-pay

## 2021-09-03 ENCOUNTER — Ambulatory Visit (INDEPENDENT_AMBULATORY_CARE_PROVIDER_SITE_OTHER): Payer: BC Managed Care – PPO | Admitting: Urology

## 2021-09-03 ENCOUNTER — Encounter: Payer: Self-pay | Admitting: Urology

## 2021-09-03 VITALS — BP 154/100 | HR 108 | Ht 68.0 in | Wt 185.0 lb

## 2021-09-03 DIAGNOSIS — E349 Endocrine disorder, unspecified: Secondary | ICD-10-CM

## 2021-09-03 MED ORDER — TESTOSTERONE 75 MG IL PLLT
75.0000 mg | PELLET | Freq: Once | Status: AC
  Administered 2021-09-03: 75 mg

## 2021-09-03 NOTE — Patient Instructions (Signed)
Apply ice to the site for 20-30 minutes every hour if needed.  Avoid hot tubes, swimming or full water immersion of the insertion site for 72 hours.  Bandage may be removed after one week.    Please contact the office if experiencing drainage of the insertion site, excessive redness or swelling of the site, chills and/or fevers > 101.5, nausea or vomiting, dizziness or lightheadedness and excessive tenderness.  Avoid strenuous activity and heavy lifting for 72 hours.

## 2021-09-27 ENCOUNTER — Other Ambulatory Visit: Payer: Self-pay

## 2021-09-27 DIAGNOSIS — E291 Testicular hypofunction: Secondary | ICD-10-CM

## 2021-09-27 DIAGNOSIS — E349 Endocrine disorder, unspecified: Secondary | ICD-10-CM

## 2021-10-04 ENCOUNTER — Other Ambulatory Visit: Payer: Self-pay

## 2021-10-04 ENCOUNTER — Other Ambulatory Visit: Payer: BC Managed Care – PPO

## 2021-10-04 DIAGNOSIS — E349 Endocrine disorder, unspecified: Secondary | ICD-10-CM | POA: Diagnosis not present

## 2021-10-04 DIAGNOSIS — E291 Testicular hypofunction: Secondary | ICD-10-CM | POA: Diagnosis not present

## 2021-10-05 LAB — TESTOSTERONE: Testosterone: 562 ng/dL (ref 264–916)

## 2021-10-25 ENCOUNTER — Telehealth: Payer: BC Managed Care – PPO | Admitting: Family

## 2021-10-25 DIAGNOSIS — J019 Acute sinusitis, unspecified: Secondary | ICD-10-CM | POA: Diagnosis not present

## 2021-10-25 MED ORDER — AMOXICILLIN-POT CLAVULANATE 875-125 MG PO TABS: 1.0000 | ORAL_TABLET | Freq: Two times a day (BID) | ORAL | 0 refills | Status: AC

## 2021-10-25 NOTE — Progress Notes (Signed)

## 2021-10-29 ENCOUNTER — Telehealth: Payer: Self-pay

## 2021-11-14 NOTE — Telephone Encounter (Signed)
Copied from [place] [phone]. Topic: General - Deceased Patient >> 11-19-21  3:03 PM Erick Blinks wrote: Reason for CRM: Medical Examiner Donata Duff, says that PCP must sign patient's death certificate. Requesting a call back   Brst contact: [phone]

## 2021-11-14 NOTE — Telephone Encounter (Signed)
Copied from [place] [phone]. Topic: General - Deceased Patient >> 11-Nov-2021  9:13 AM Alanda Slim E wrote: Reason for CRM: Colletta Maryland called to figure out who the Pts PCP is and when he was last seen/ she stated pt came into [facility] DO[facility] and they wanted to know if any provider at the office today feel comfortable signing death cert. She asked that someone please give her a call today.    Spoke with Colletta Maryland at [facility] and informed her that per Dr. Ancil Boozer they (providers) are uncomfortable with signing the death certificate since they don't  know the cause of death.  I informed Colletta Maryland that Henry Fuller PCP is out on FMLA and that the patient was last seen in our office on 05/26/21 for jaw pain.

## 2021-11-14 DEATH — deceased

## 2021-11-26 ENCOUNTER — Telehealth: Payer: Self-pay

## 2021-11-26 NOTE — Telephone Encounter (Signed)
Copied from [place] [phone]. Topic: General - Other >> Nov 26, 2021 11:04 AM Pawlus, Brayton Layman A wrote: Reason for CRM: [facility] called to follow up on who will sign the pts death certificate, caller stated she faxed over the request again today.

## 2021-11-26 NOTE — Telephone Encounter (Signed)
Per Marnette Burgess the manager said send this to Dr Ancil Boozer

## 2021-12-01 NOTE — Telephone Encounter (Signed)
Death certificate was completed by Dr. Ancil Boozer on 12/01/21 in [place].

## 2021-12-13 ENCOUNTER — Encounter (INDEPENDENT_AMBULATORY_CARE_PROVIDER_SITE_OTHER): Payer: BC Managed Care – PPO

## 2021-12-13 ENCOUNTER — Ambulatory Visit (INDEPENDENT_AMBULATORY_CARE_PROVIDER_SITE_OTHER): Payer: BC Managed Care – PPO | Admitting: Vascular Surgery
